# Patient Record
Sex: Female | Born: 1945 | Race: White | Hispanic: No | Marital: Married | Smoking: Never smoker
Health system: Southern US, Community
[De-identification: ages and names within clinical notes are randomized; demographics above are authoritative.]

## PROBLEM LIST (undated history)

## (undated) DIAGNOSIS — J3089 Other allergic rhinitis: Secondary | ICD-10-CM

## (undated) DIAGNOSIS — K219 Gastro-esophageal reflux disease without esophagitis: Secondary | ICD-10-CM

## (undated) DIAGNOSIS — R0600 Dyspnea, unspecified: Secondary | ICD-10-CM

## (undated) DIAGNOSIS — Z8709 Personal history of other diseases of the respiratory system: Secondary | ICD-10-CM

## (undated) DIAGNOSIS — R7303 Prediabetes: Secondary | ICD-10-CM

## (undated) DIAGNOSIS — R1310 Dysphagia, unspecified: Secondary | ICD-10-CM

## (undated) DIAGNOSIS — C4490 Unspecified malignant neoplasm of skin, unspecified: Secondary | ICD-10-CM

## (undated) DIAGNOSIS — M1990 Unspecified osteoarthritis, unspecified site: Secondary | ICD-10-CM

## (undated) DIAGNOSIS — I10 Essential (primary) hypertension: Secondary | ICD-10-CM

## (undated) DIAGNOSIS — E039 Hypothyroidism, unspecified: Secondary | ICD-10-CM

## (undated) DIAGNOSIS — F418 Other specified anxiety disorders: Secondary | ICD-10-CM

## (undated) DIAGNOSIS — Z8719 Personal history of other diseases of the digestive system: Secondary | ICD-10-CM

## (undated) DIAGNOSIS — L111 Transient acantholytic dermatosis [Grover]: Secondary | ICD-10-CM

## (undated) DIAGNOSIS — T7840XA Allergy, unspecified, initial encounter: Secondary | ICD-10-CM

## (undated) DIAGNOSIS — E041 Nontoxic single thyroid nodule: Secondary | ICD-10-CM

## (undated) DIAGNOSIS — R892 Abnormal level of other drugs, medicaments and biological substances in specimens from other organs, systems and tissues: Secondary | ICD-10-CM

## (undated) HISTORY — DX: Other specified anxiety disorders: F41.8

## (undated) HISTORY — DX: Essential (primary) hypertension: I10

## (undated) HISTORY — PX: OOPHORECTOMY: SHX86

## (undated) HISTORY — DX: Transient acantholytic dermatosis (grover): L11.1

## (undated) HISTORY — DX: Other allergic rhinitis: J30.89

## (undated) HISTORY — DX: Prediabetes: R73.03

## (undated) HISTORY — DX: Nontoxic single thyroid nodule: E04.1

## (undated) HISTORY — DX: Personal history of other diseases of the respiratory system: Z87.09

## (undated) HISTORY — DX: Unspecified malignant neoplasm of skin, unspecified: C44.90

## (undated) HISTORY — DX: Hypothyroidism, unspecified: E03.9

## (undated) HISTORY — PX: WISDOM TOOTH EXTRACTION: SHX21

## (undated) HISTORY — DX: Personal history of other diseases of the digestive system: Z87.19

## (undated) HISTORY — DX: Allergy, unspecified, initial encounter: T78.40XA

## (undated) HISTORY — DX: Unspecified osteoarthritis, unspecified site: M19.90

## (undated) HISTORY — DX: Abnormal level of other drugs, medicaments and biological substances in specimens from other organs, systems and tissues: R89.2

## (undated) HISTORY — PX: TUBAL LIGATION: SHX77

---

## 1993-11-30 HISTORY — PX: TOTAL ABDOMINAL HYSTERECTOMY: SHX209

## 2000-10-07 ENCOUNTER — Other Ambulatory Visit: Admission: RE | Admit: 2000-10-07 | Discharge: 2000-10-07 | Payer: Self-pay | Admitting: Gynecology

## 2003-12-13 ENCOUNTER — Other Ambulatory Visit: Admission: RE | Admit: 2003-12-13 | Discharge: 2003-12-13 | Payer: Self-pay | Admitting: Gynecology

## 2005-09-24 ENCOUNTER — Ambulatory Visit (HOSPITAL_COMMUNITY): Admission: RE | Admit: 2005-09-24 | Discharge: 2005-09-24 | Payer: Self-pay | Admitting: Chiropractic Medicine

## 2008-10-02 ENCOUNTER — Emergency Department (HOSPITAL_COMMUNITY): Admission: EM | Admit: 2008-10-02 | Discharge: 2008-10-03 | Payer: Self-pay | Admitting: Emergency Medicine

## 2008-10-30 HISTORY — PX: ESOPHAGOGASTRODUODENOSCOPY: SHX1529

## 2008-10-30 HISTORY — PX: COLONOSCOPY: SHX174

## 2008-11-07 LAB — HM COLONOSCOPY: HM COLON: NORMAL

## 2010-08-30 DIAGNOSIS — C4490 Unspecified malignant neoplasm of skin, unspecified: Secondary | ICD-10-CM

## 2010-08-30 HISTORY — DX: Unspecified malignant neoplasm of skin, unspecified: C44.90

## 2011-09-01 LAB — POCT CARDIAC MARKERS
CKMB, poc: 1.1
Myoglobin, poc: 45
Troponin i, poc: <0.05

## 2011-09-01 LAB — CBC
HCT: 38.1
Hemoglobin: 13
MCHC: 34.2
MCV: 92.7
Platelets: 247
RBC: 4.11
RDW: 12.4
WBC: 10.6 — ABNORMAL HIGH

## 2011-09-01 LAB — DIFFERENTIAL
Basophils Absolute: 0
Basophils Relative: 0
Eosinophils Absolute: 0.2
Eosinophils Relative: 2
Lymphocytes Relative: 13
Lymphs Abs: 1.4
Monocytes Absolute: 0.7
Monocytes Relative: 7
Neutro Abs: 8.3 — ABNORMAL HIGH
Neutrophils Relative %: 78 — ABNORMAL HIGH

## 2011-09-01 LAB — POCT I-STAT, CHEM 8
BUN: 13
Calcium, Ion: 1.14
Chloride: 105
Creatinine, Ser: 0.8
Glucose, Bld: 103 — ABNORMAL HIGH
HCT: 38
Hemoglobin: 12.9
Potassium: 3.9
Sodium: 141
TCO2: 26

## 2013-11-30 DIAGNOSIS — E039 Hypothyroidism, unspecified: Secondary | ICD-10-CM

## 2013-11-30 HISTORY — DX: Hypothyroidism, unspecified: E03.9

## 2014-06-11 DIAGNOSIS — H2510 Age-related nuclear cataract, unspecified eye: Secondary | ICD-10-CM | POA: Diagnosis not present

## 2014-06-11 DIAGNOSIS — H40039 Anatomical narrow angle, unspecified eye: Secondary | ICD-10-CM | POA: Diagnosis not present

## 2014-06-30 DIAGNOSIS — R7303 Prediabetes: Secondary | ICD-10-CM

## 2014-06-30 HISTORY — DX: Prediabetes: R73.03

## 2014-07-07 ENCOUNTER — Emergency Department: Payer: Self-pay | Admitting: Emergency Medicine

## 2014-07-07 DIAGNOSIS — S51809A Unspecified open wound of unspecified forearm, initial encounter: Secondary | ICD-10-CM | POA: Diagnosis not present

## 2014-07-07 DIAGNOSIS — S59919A Unspecified injury of unspecified forearm, initial encounter: Secondary | ICD-10-CM | POA: Diagnosis not present

## 2014-07-07 DIAGNOSIS — Z23 Encounter for immunization: Secondary | ICD-10-CM | POA: Diagnosis not present

## 2014-07-07 DIAGNOSIS — S59909A Unspecified injury of unspecified elbow, initial encounter: Secondary | ICD-10-CM | POA: Diagnosis not present

## 2014-07-17 DIAGNOSIS — IMO0002 Reserved for concepts with insufficient information to code with codable children: Secondary | ICD-10-CM | POA: Diagnosis not present

## 2014-07-17 DIAGNOSIS — E782 Mixed hyperlipidemia: Secondary | ICD-10-CM | POA: Diagnosis not present

## 2014-07-17 DIAGNOSIS — G894 Chronic pain syndrome: Secondary | ICD-10-CM | POA: Diagnosis not present

## 2014-07-17 DIAGNOSIS — I10 Essential (primary) hypertension: Secondary | ICD-10-CM | POA: Diagnosis not present

## 2014-07-17 DIAGNOSIS — F411 Generalized anxiety disorder: Secondary | ICD-10-CM | POA: Diagnosis not present

## 2014-07-18 ENCOUNTER — Emergency Department: Payer: Self-pay | Admitting: Emergency Medicine

## 2014-07-18 DIAGNOSIS — Z4802 Encounter for removal of sutures: Secondary | ICD-10-CM | POA: Diagnosis not present

## 2014-07-18 LAB — VITAMIN D 25 HYDROXY (VIT D DEFICIENCY, FRACTURES): Vit D, 25-Hydroxy: 46

## 2014-07-18 LAB — VITAMIN B12: VITAMIN B12 DEFICIENCY PANEL: 386

## 2014-07-18 LAB — CBC
HGB: 12.8 g/dL
PLATELET COUNT: 288
WBC: 5.3

## 2014-07-18 LAB — HEMOGLOBIN A1C: A1C: 6

## 2014-08-01 DIAGNOSIS — R634 Abnormal weight loss: Secondary | ICD-10-CM | POA: Diagnosis not present

## 2014-08-01 DIAGNOSIS — Z1211 Encounter for screening for malignant neoplasm of colon: Secondary | ICD-10-CM | POA: Diagnosis not present

## 2014-08-21 ENCOUNTER — Encounter (INDEPENDENT_AMBULATORY_CARE_PROVIDER_SITE_OTHER): Payer: Self-pay

## 2014-08-21 ENCOUNTER — Encounter: Payer: Self-pay | Admitting: Family Medicine

## 2014-08-21 ENCOUNTER — Ambulatory Visit (INDEPENDENT_AMBULATORY_CARE_PROVIDER_SITE_OTHER): Payer: Medicare Other | Admitting: Family Medicine

## 2014-08-21 VITALS — BP 147/71 | HR 63 | Temp 97.5°F | Ht 63.0 in | Wt 132.0 lb

## 2014-08-21 DIAGNOSIS — F341 Dysthymic disorder: Secondary | ICD-10-CM

## 2014-08-21 DIAGNOSIS — M1990 Unspecified osteoarthritis, unspecified site: Secondary | ICD-10-CM | POA: Insufficient documentation

## 2014-08-21 DIAGNOSIS — Z79890 Hormone replacement therapy (postmenopausal): Secondary | ICD-10-CM

## 2014-08-21 DIAGNOSIS — J309 Allergic rhinitis, unspecified: Secondary | ICD-10-CM

## 2014-08-21 DIAGNOSIS — J3089 Other allergic rhinitis: Secondary | ICD-10-CM

## 2014-08-21 DIAGNOSIS — F331 Major depressive disorder, recurrent, moderate: Secondary | ICD-10-CM | POA: Insufficient documentation

## 2014-08-21 DIAGNOSIS — E8940 Asymptomatic postprocedural ovarian failure: Secondary | ICD-10-CM | POA: Insufficient documentation

## 2014-08-21 DIAGNOSIS — I10 Essential (primary) hypertension: Secondary | ICD-10-CM | POA: Diagnosis not present

## 2014-08-21 DIAGNOSIS — M129 Arthropathy, unspecified: Secondary | ICD-10-CM

## 2014-08-21 DIAGNOSIS — R946 Abnormal results of thyroid function studies: Secondary | ICD-10-CM

## 2014-08-21 DIAGNOSIS — R7989 Other specified abnormal findings of blood chemistry: Secondary | ICD-10-CM | POA: Insufficient documentation

## 2014-08-21 DIAGNOSIS — F418 Other specified anxiety disorders: Secondary | ICD-10-CM

## 2014-08-21 MED ORDER — MONTELUKAST SODIUM 10 MG PO TABS: 10.0000 mg | ORAL_TABLET | Freq: Every day | ORAL | Status: DC

## 2014-08-21 NOTE — Assessment & Plan Note (Signed)
Discussed family stressors - continue klonopin nightly for now.

## 2014-08-21 NOTE — Patient Instructions (Signed)
Continue meds as up to now. Continue zyrtec in am, consider benadryl at night time for allergies. Trial of singulair for the next month for allergies. Return in the next few weeks for a welcome to medicare visit, prior fasting for blood work. Good to meet you today, call us with questions.

## 2014-08-21 NOTE — Assessment & Plan Note (Signed)
Discussed different options - recommend continue zyrtec in am and benadryl at night time. Did not tolerate INS in past. rec trial of singulair nightly for next month.

## 2014-08-21 NOTE — Progress Notes (Signed)
Pre visit review using our clinic review tool, if applicable. No additional management support is needed unless otherwise documented below in the visit note. 

## 2014-08-21 NOTE — Assessment & Plan Note (Signed)
Check TFT next fasting labwork.

## 2014-08-21 NOTE — Assessment & Plan Note (Signed)
Continue premarin. Consider decreased dose.

## 2014-08-21 NOTE — Assessment & Plan Note (Signed)
Chronic, mildly elevated despite coreg 6.25mg  once daily, pt hesitant for any changes today. Continue to monitor.

## 2014-08-21 NOTE — Progress Notes (Signed)
BP 147/71  Pulse 63  Temp(Src) 97.5 F (36.4 C) (Tympanic)  Ht 5\' 3"  (1.6 m)  Wt 132 lb (59.875 kg)  BMI 23.39 kg/m2  SpO2 99%   CC: new pt to establish care  Subjective:    Patient ID: Donna Vargas, female    DOB: 1946/08/07, 68 y.o.   MRN: [id number]  HPI: Donna Vargas is a 68 y.o. female presenting on 08/21/2014 for Establish Care   Prior saw Dr. Rex Kras. Received medicare last October at age 44. Weight loss noted, but more active since retired. Staying worried about son - he sees Dr. Keturah Barre.  HTN - bp tends to run 284 systolic. Compliant with coreg 6.25mg  once daily. If she takes 2, bp runs too low. No HA, vision changes, CP/tightness, SOB, leg swelling.  Longterm on klonazepam 0.5mg  nightly - uses this for sleep and "to calm me down".   Perennial allergic rhinitis - takes cetirizine 10mg  bid. Marked congestion > rhinorrhea. Itchy watery eyes as well. +PNdrainage. Constantly clears throat. No cough. No fevers. No headaches. On cetirizine longterm. Has tried several different medications all OTC - claritin, allegra, benadryl. Does not tolerate nasal steroids very well. Has not tried singulair. Has been tested for allergies, states allergic to everything.   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   Preventative: Last CPE within the last year. Colonoscopy 2009 [place] Tdap 2015 Pap smear 2014 G1P1  Relevant past medical, surgical, family and social history reviewed and updated as indicated.  Allergies and medications reviewed and updated. No current outpatient prescriptions on file prior to visit.   No current facility-administered medications on file prior to visit.    Review of Systems Per HPI unless specifically indicated above    Objective:    BP 147/71  Pulse 63  Temp(Src) 97.5 F (36.4 C) (Tympanic)  Ht 5\' 3"  (1.6 m)  Wt 132 lb (59.875 kg)  BMI 23.39 kg/m2  SpO2 99%  Physical Exam  Nursing note and vitals reviewed. Constitutional: She appears  well-developed and well-nourished. No distress.  HENT:  Nose: Nose normal. No mucosal edema or rhinorrhea. Right sinus exhibits no maxillary sinus tenderness and no frontal sinus tenderness. Left sinus exhibits no maxillary sinus tenderness and no frontal sinus tenderness.  Mouth/Throat: Oropharynx is clear and moist. No oropharyngeal exudate.  Eyes: Conjunctivae are normal. Pupils are equal, round, and reactive to light. No scleral icterus.  Neck: Normal range of motion. Neck supple. No thyromegaly present.  Cardiovascular: Normal rate, regular rhythm, normal heart sounds and intact distal pulses.   No murmur heard. Pulmonary/Chest: Effort normal and breath sounds normal. No respiratory distress. She has no wheezes. She has no rales.  Musculoskeletal: She exhibits no edema.  Lymphadenopathy:    She has no cervical adenopathy.       Assessment & Plan:   Problem List Items Addressed This Visit   Surgical menopause on hormone replacement therapy     Continue premarin. Consider decreased dose.    Perennial allergic rhinitis - Primary     Discussed different options - recommend continue zyrtec in am and benadryl at night time. Did not tolerate INS in past. rec trial of singulair nightly for next month.    Hypertension     Chronic, mildly elevated despite coreg 6.25mg  once daily, pt hesitant for any changes today. Continue to monitor.    Relevant Medications      carvedilol (COREG) 6.25 MG tablet      aspirin 81 MG tablet  Depression with anxiety     Discussed family stressors - continue klonopin nightly for now.    Arthritis   Relevant Medications      aspirin 81 MG tablet   Abnormal thyroid blood test     Check TFT next fasting labwork.        Follow up plan: Return in about 3 weeks (around 09/11/2014), or as needed, for welcome to medicare visit.

## 2014-08-29 ENCOUNTER — Other Ambulatory Visit: Payer: Self-pay | Admitting: *Deleted

## 2014-08-29 MED ORDER — ESTROGENS CONJUGATED 0.45 MG PO TABS: 0.4500 mg | ORAL_TABLET | Freq: Every day | ORAL | Status: DC

## 2014-08-30 DIAGNOSIS — H02834 Dermatochalasis of left upper eyelid: Secondary | ICD-10-CM | POA: Diagnosis not present

## 2014-08-30 DIAGNOSIS — H25813 Combined forms of age-related cataract, bilateral: Secondary | ICD-10-CM | POA: Diagnosis not present

## 2014-08-30 DIAGNOSIS — H02831 Dermatochalasis of right upper eyelid: Secondary | ICD-10-CM | POA: Diagnosis not present

## 2014-08-31 ENCOUNTER — Ambulatory Visit (INDEPENDENT_AMBULATORY_CARE_PROVIDER_SITE_OTHER): Payer: Medicare Other | Admitting: Family Medicine

## 2014-08-31 ENCOUNTER — Encounter: Payer: Self-pay | Admitting: Family Medicine

## 2014-08-31 VITALS — BP 138/82 | HR 68 | Temp 97.9°F | Wt 131.5 lb

## 2014-08-31 DIAGNOSIS — M25561 Pain in right knee: Secondary | ICD-10-CM | POA: Diagnosis not present

## 2014-08-31 MED ORDER — NAPROXEN 500 MG PO TABS: ORAL_TABLET | ORAL | Status: DC

## 2014-08-31 NOTE — Patient Instructions (Signed)
I think you have right knee ligament strain - treat with naprosyn (prescription strength aleve) twice daily for 5 days with food then as needed May continue heating pad.  Start using a neoprene sleeve brace for the right knee. Start exercises provided today. If not improving with above, let me know.

## 2014-08-31 NOTE — Assessment & Plan Note (Signed)
Anticipate lateral collateral ligament sprain given exam today. Treat with stretching exercises provided today, heating pad, naprosyn prescription sent to pharmacy, and recommended start using neoprene knee sleeve brace. Update if not improving, ddx includes meniscal injury and early arthritis.

## 2014-08-31 NOTE — Progress Notes (Signed)
Pre visit review using our clinic review tool, if applicable. No additional management support is needed unless otherwise documented below in the visit note. 

## 2014-08-31 NOTE — Progress Notes (Signed)
   BP 138/82  Pulse 68  Temp(Src) 97.9 F (36.6 C) (Oral)  Wt 131 lb 8 oz (59.648 kg)  SpO2 97%   CC: R knee pain  Subjective:    Patient ID: Donna Vargas, female    DOB: 04/10/1946, 68 y.o.   MRN: [id number]  HPI: Donna Vargas is a 68 y.o. female presenting on 08/31/2014 for Knee Pain   Off and on knee pain over months. Acutely worse over last week. Denies warmth or redness of knee. Possibly more swollen as day progresses. Bearing weight and kneeling worsens pain. No locking.  + instability when walking. Denies inciting trauma/injury. Aleve helps.   H/o arthritis in digits but never large joint involvement.  Relevant past medical, surgical, family and social history reviewed and updated as indicated.  Allergies and medications reviewed and updated. Current Outpatient Prescriptions on File Prior to Visit  Medication Sig  . aspirin 81 MG tablet Take 81 mg by mouth daily.  . carvedilol (COREG) 6.25 MG tablet Take 6.25 mg by mouth daily.   . cetirizine (ZYRTEC) 10 MG tablet Take 10 mg by mouth daily.  . clonazePAM (KLONOPIN) 0.5 MG tablet Take 0.5 mg by mouth at bedtime.   Marland Kitchen estrogens, conjugated, (PREMARIN) 0.45 MG tablet Take 1 tablet (0.45 mg total) by mouth daily.  . montelukast (SINGULAIR) 10 MG tablet Take 1 tablet (10 mg total) by mouth at bedtime.   No current facility-administered medications on file prior to visit.    Review of Systems Per HPI unless specifically indicated above    Objective:    BP 138/82  Pulse 68  Temp(Src) 97.9 F (36.6 C) (Oral)  Wt 131 lb 8 oz (59.648 kg)  SpO2 97%  Physical Exam  Nursing note and vitals reviewed. Constitutional: She appears well-developed and well-nourished. No distress.  Musculoskeletal: She exhibits no edema.  L knee WNL R Knee exam: No deformity on inspection. Pain with palpation of lateral collateral ligament No effusion/swelling noted. FROM in flex/extension without crepitus. No popliteal  fullness. Neg drawer test. Mild discomfort with mcmurray's No pain with valgus/varus stress. No PFgrind. Tight patellar mobility on right  Skin: Skin is warm and dry. No rash noted.       Assessment & Plan:   Problem List Items Addressed This Visit   Pain in lateral portion of right knee - Primary     Anticipate lateral collateral ligament sprain given exam today. Treat with stretching exercises provided today, heating pad, naprosyn prescription sent to pharmacy, and recommended start using neoprene knee sleeve brace. Update if not improving, ddx includes meniscal injury and early arthritis.        Follow up plan: Return if symptoms worsen or fail to improve.

## 2014-09-06 ENCOUNTER — Other Ambulatory Visit (INDEPENDENT_AMBULATORY_CARE_PROVIDER_SITE_OTHER): Payer: Medicare Other

## 2014-09-06 DIAGNOSIS — R946 Abnormal results of thyroid function studies: Secondary | ICD-10-CM | POA: Diagnosis not present

## 2014-09-06 DIAGNOSIS — I10 Essential (primary) hypertension: Secondary | ICD-10-CM | POA: Diagnosis not present

## 2014-09-06 DIAGNOSIS — R7989 Other specified abnormal findings of blood chemistry: Secondary | ICD-10-CM

## 2014-09-06 LAB — COMPREHENSIVE METABOLIC PANEL
ALT: 10 U/L (ref 0–35)
AST: 19 U/L (ref 0–37)
Albumin: 3.5 g/dL (ref 3.5–5.2)
Alkaline Phosphatase: 51 U/L (ref 39–117)
BUN: 16 mg/dL (ref 6–23)
CO2: 30 mEq/L (ref 19–32)
Calcium: 9 mg/dL (ref 8.4–10.5)
Chloride: 105 mEq/L (ref 96–112)
Creatinine, Ser: 0.7 mg/dL (ref 0.4–1.2)
GFR: 92.02 mL/min (ref >60.00)
Glucose, Bld: 87 mg/dL (ref 70–99)
Potassium: 4 mEq/L (ref 3.5–5.1)
Sodium: 141 mEq/L (ref 135–145)
Total Bilirubin: 0.4 mg/dL (ref 0.2–1.2)
Total Protein: 7.5 g/dL (ref 6.0–8.3)

## 2014-09-06 LAB — LIPID PANEL
Cholesterol: 227 mg/dL — ABNORMAL HIGH (ref 0–200)
HDL: 58.9 mg/dL (ref >39.00)
LDL Cholesterol: 151 mg/dL — ABNORMAL HIGH (ref 0–99)
NonHDL: 168.1
Total CHOL/HDL Ratio: 4
Triglycerides: 86 mg/dL (ref 0.0–149.0)
VLDL: 17.2 mg/dL (ref 0.0–40.0)

## 2014-09-06 LAB — T4, FREE: Free T4: 0.84 ng/dL (ref 0.60–1.60)

## 2014-09-06 LAB — TSH: TSH: 3.3 u[IU]/mL (ref 0.35–4.50)

## 2014-09-07 LAB — T3: T3, Total: 124.5 ng/dL (ref 80.0–204.0)

## 2014-09-11 ENCOUNTER — Encounter: Payer: Self-pay | Admitting: Family Medicine

## 2014-09-11 ENCOUNTER — Ambulatory Visit (INDEPENDENT_AMBULATORY_CARE_PROVIDER_SITE_OTHER): Payer: Medicare Other | Admitting: Family Medicine

## 2014-09-11 VITALS — BP 144/84 | HR 64 | Temp 97.7°F | Ht 63.0 in | Wt 129.2 lb

## 2014-09-11 DIAGNOSIS — R946 Abnormal results of thyroid function studies: Secondary | ICD-10-CM

## 2014-09-11 DIAGNOSIS — R7989 Other specified abnormal findings of blood chemistry: Secondary | ICD-10-CM

## 2014-09-11 DIAGNOSIS — Z7189 Other specified counseling: Secondary | ICD-10-CM | POA: Insufficient documentation

## 2014-09-11 DIAGNOSIS — Z0000 Encounter for general adult medical examination without abnormal findings: Secondary | ICD-10-CM

## 2014-09-11 DIAGNOSIS — Z79890 Hormone replacement therapy (postmenopausal): Secondary | ICD-10-CM

## 2014-09-11 DIAGNOSIS — I10 Essential (primary) hypertension: Secondary | ICD-10-CM

## 2014-09-11 DIAGNOSIS — E8940 Asymptomatic postprocedural ovarian failure: Secondary | ICD-10-CM

## 2014-09-11 DIAGNOSIS — E041 Nontoxic single thyroid nodule: Secondary | ICD-10-CM

## 2014-09-11 HISTORY — DX: Nontoxic single thyroid nodule: E04.1

## 2014-09-11 MED ORDER — ESTROGENS CONJUGATED 0.3 MG PO TABS: 0.3000 mg | ORAL_TABLET | Freq: Every day | ORAL | Status: DC

## 2014-09-11 NOTE — Assessment & Plan Note (Signed)
Advanced directive: has at home. Will bring me copy. HCPOA is husband.

## 2014-09-11 NOTE — Assessment & Plan Note (Signed)
Thyroid US ordered today. 

## 2014-09-11 NOTE — Progress Notes (Signed)
Pre visit review using our clinic review tool, if applicable. No additional management support is needed unless otherwise documented below in the visit note. 

## 2014-09-11 NOTE — Patient Instructions (Addendum)
Sign another release of records from Dr. Rex Kras Bring me copy of advanced directive.  Good to see you today, call us with questions. Return in 6 months for recheck. Start lower premarin dose sent to pharmacy. Pass by Linda's office to schedule thyroid ultrasound.  Health Maintenance Adopting a healthy lifestyle and getting preventive care can go a long way to promote health and wellness. Talk with your health care provider about what schedule of regular examinations is right for you. This is a good chance for you to check in with your provider about disease prevention and staying healthy. In between checkups, there are plenty of things you can do on your own. Experts have done a lot of research about which lifestyle changes and preventive measures are most likely to keep you healthy. Ask your health care provider for more information. WEIGHT AND DIET  Eat a healthy diet  Be sure to include plenty of vegetables, fruits, low-fat dairy products, and lean protein.  Do not eat a lot of foods high in solid fats, added sugars, or salt.  Get regular exercise. This is one of the most important things you can do for your health.  Most adults should exercise for at least 150 minutes each week. The exercise should increase your heart rate and make you sweat (moderate-intensity exercise).  Most adults should also do strengthening exercises at least twice a week. This is in addition to the moderate-intensity exercise.  Maintain a healthy weight  Body mass index (BMI) is a measurement that can be used to identify possible weight problems. It estimates body fat based on height and weight. Your health care provider can help determine your BMI and help you achieve or maintain a healthy weight.  For females 61 years of age and older:   A BMI below 18.5 is considered underweight.  A BMI of 18.5 to 24.9 is normal.  A BMI of 25 to 29.9 is considered overweight.  A BMI of 30 and above is considered  obese.  Watch levels of cholesterol and blood lipids  You should start having your blood tested for lipids and cholesterol at 66 years of age, then have this test every 5 years.  You may need to have your cholesterol levels checked more often if:  Your lipid or cholesterol levels are high.  You are older than 66 years of age.  You are at high risk for heart disease.  CANCER SCREENING   Lung Cancer  Lung cancer screening is recommended for adults 4-41 years old who are at high risk for lung cancer because of a history of smoking.  A yearly low-dose CT scan of the lungs is recommended for people who:  Currently smoke.  Have quit within the past 15 years.  Have at least a 30-pack-year history of smoking. A pack year is smoking an average of one pack of cigarettes a day for 1 year.  Yearly screening should continue until it has been 15 years since you quit.  Yearly screening should stop if you develop a health problem that would prevent you from having lung cancer treatment.  Breast Cancer  Practice breast self-awareness. This means understanding how your breasts normally appear and feel.  It also means doing regular breast self-exams. Let your health care provider know about any changes, no matter how small.  If you are in your 20s or 30s, you should have a clinical breast exam (CBE) by a health care provider every 1-3 years as part of a regular  health exam.  If you are 40 or older, have a CBE every year. Also consider having a breast X-ray (mammogram) every year.  If you have a family history of breast cancer, talk to your health care provider about genetic screening.  If you are at high risk for breast cancer, talk to your health care provider about having an MRI and a mammogram every year.  Breast cancer gene (BRCA) assessment is recommended for women who have family members with BRCA-related cancers. BRCA-related cancers  include:  Breast.  Ovarian.  Tubal.  Peritoneal cancers.  Results of the assessment will determine the need for genetic counseling and BRCA1 and BRCA2 testing. Cervical Cancer Routine pelvic examinations to screen for cervical cancer are no longer recommended for nonpregnant women who are considered low risk for cancer of the pelvic organs (ovaries, uterus, and vagina) and who do not have symptoms. A pelvic examination may be necessary if you have symptoms including those associated with pelvic infections. Ask your health care provider if a screening pelvic exam is right for you.   The Pap test is the screening test for cervical cancer for women who are considered at risk.  If you had a hysterectomy for a problem that was not cancer or a condition that could lead to cancer, then you no longer need Pap tests.  If you are older than 65 years, and you have had normal Pap tests for the past 10 years, you no longer need to have Pap tests.  If you have had past treatment for cervical cancer or a condition that could lead to cancer, you need Pap tests and screening for cancer for at least 20 years after your treatment.  If you no longer get a Pap test, assess your risk factors if they change (such as having a new sexual partner). This can affect whether you should start being screened again.  Some women have medical problems that increase their chance of getting cervical cancer. If this is the case for you, your health care provider may recommend more frequent screening and Pap tests.  The human papillomavirus (HPV) test is another test that may be used for cervical cancer screening. The HPV test looks for the virus that can cause cell changes in the cervix. The cells collected during the Pap test can be tested for HPV.  The HPV test can be used to screen women 52 years of age and older. Getting tested for HPV can extend the interval between normal Pap tests from three to five years.  An HPV  test also should be used to screen women of any age who have unclear Pap test results.  After 66 years of age, women should have HPV testing as often as Pap tests.  Colorectal Cancer  This type of cancer can be detected and often prevented.  Routine colorectal cancer screening usually begins at 66 years of age and continues through 66 years of age.  Your health care provider may recommend screening at an earlier age if you have risk factors for colon cancer.  Your health care provider may also recommend using home test kits to check for hidden blood in the stool.  A small camera at the end of a tube can be used to examine your colon directly (sigmoidoscopy or colonoscopy). This is done to check for the earliest forms of colorectal cancer.  Routine screening usually begins at age 60.  Direct examination of the colon should be repeated every 5-10 years through 65 years  of age. However, you may need to be screened more often if early forms of precancerous polyps or small growths are found. Skin Cancer  Check your skin from head to toe regularly.  Tell your health care provider about any new moles or changes in moles, especially if there is a change in a mole's shape or color.  Also tell your health care provider if you have a mole that is larger than the size of a pencil eraser.  Always use sunscreen. Apply sunscreen liberally and repeatedly throughout the day.  Protect yourself by wearing long sleeves, pants, a wide-brimmed hat, and sunglasses whenever you are outside. HEART DISEASE, DIABETES, AND HIGH BLOOD PRESSURE   Have your blood pressure checked at least every 1-2 years. High blood pressure causes heart disease and increases the risk of stroke.  If you are between 2 years and 36 years old, ask your health care provider if you should take aspirin to prevent strokes.  Have regular diabetes screenings. This involves taking a blood sample to check your fasting blood sugar  level.  If you are at a normal weight and have a low risk for diabetes, have this test once every three years after 66 years of age.  If you are overweight and have a high risk for diabetes, consider being tested at a younger age or more often. PREVENTING INFECTION  Hepatitis B  If you have a higher risk for hepatitis B, you should be screened for this virus. You are considered at high risk for hepatitis B if:  You were born in a country where hepatitis B is common. Ask your health care provider which countries are considered high risk.  Your parents were born in a high-risk country, and you have not been immunized against hepatitis B (hepatitis B vaccine).  You have HIV or AIDS.  You use needles to inject street drugs.  You live with someone who has hepatitis B.  You have had sex with someone who has hepatitis B.  You get hemodialysis treatment.  You take certain medicines for conditions, including cancer, organ transplantation, and autoimmune conditions. Hepatitis C  Blood testing is recommended for:  Everyone born from [birth year] through 1963.  Anyone with known risk factors for hepatitis C. Sexually transmitted infections (STIs)  You should be screened for sexually transmitted infections (STIs) including gonorrhea and chlamydia if:  You are sexually active and are younger than 66 years of age.  You are older than 66 years of age and your health care provider tells you that you are at risk for this type of infection.  Your sexual activity has changed since you were last screened and you are at an increased risk for chlamydia or gonorrhea. Ask your health care provider if you are at risk.  If you do not have HIV, but are at risk, it may be recommended that you take a prescription medicine daily to prevent HIV infection. This is called pre-exposure prophylaxis (PrEP). You are considered at risk if:  You are sexually active and do not regularly use condoms or know the HIV status  of your partner(s).  You take drugs by injection.  You are sexually active with a partner who has HIV. Talk with your health care provider about whether you are at high risk of being infected with HIV. If you choose to begin PrEP, you should first be tested for HIV. You should then be tested every 3 months for as long as you are taking PrEP.  PREGNANCY  If you are premenopausal and you may become pregnant, ask your health care provider about preconception counseling.  If you may become pregnant, take 400 to 800 micrograms (mcg) of folic acid every day.  If you want to prevent pregnancy, talk to your health care provider about birth control (contraception). OSTEOPOROSIS AND MENOPAUSE   Osteoporosis is a disease in which the bones lose minerals and strength with aging. This can result in serious bone fractures. Your risk for osteoporosis can be identified using a bone density scan.  If you are 55 years of age or older, or if you are at risk for osteoporosis and fractures, ask your health care provider if you should be screened.  Ask your health care provider whether you should take a calcium or vitamin D supplement to lower your risk for osteoporosis.  Menopause may have certain physical symptoms and risks.  Hormone replacement therapy may reduce some of these symptoms and risks. Talk to your health care provider about whether hormone replacement therapy is right for you.  HOME CARE INSTRUCTIONS   Schedule regular health, dental, and eye exams.  Stay current with your immunizations.   Do not use any tobacco products including cigarettes, chewing tobacco, or electronic cigarettes.  If you are pregnant, do not drink alcohol.  If you are breastfeeding, limit how much and how often you drink alcohol.  Limit alcohol intake to no more than 1 drink per day for nonpregnant women. One drink equals 12 ounces of beer, 5 ounces of wine, or 1 ounces of hard liquor.  Do not use street  drugs.  Do not share needles.  Ask your health care provider for help if you need support or information about quitting drugs.  Tell your health care provider if you often feel depressed.  Tell your health care provider if you have ever been abused or do not feel safe at home. Document Released: 06/01/2011 Document Revised: 04/02/2014 Document Reviewed: 10/18/2013 [facility] Patient Information 2015 [place], [place]. This information is not intended to replace advice given to you by your health care provider. Make sure you discuss any questions you have with your health care provider.

## 2014-09-11 NOTE — Assessment & Plan Note (Signed)
On recheck, normal.

## 2014-09-11 NOTE — Assessment & Plan Note (Signed)
I have personally reviewed the Medicare Annual Wellness questionnaire and have noted 1. The patient's medical and social history 2. Their use of alcohol, tobacco or illicit drugs 3. Their current medications and supplements 4. The patient's functional ability including ADL's, fall risks, home safety risks and hearing or visual impairment. 5. Diet and physical activity 6. Evidence for depression or mood disorders The patients weight, height, BMI have been recorded in the chart.  Hearing and vision has been addressed. I have made referrals, counseling and provided education to the patient based review of the above and I have provided the pt with a written personalized care plan for preventive services. Provider list updated - see scanned questionairre.  Reviewed preventative protocols and updated unless pt declined. EKG - NSR rate 60s, normal axis, intervals, no acute ST/T changes, ?neg QRS anterior leads

## 2014-09-11 NOTE — Assessment & Plan Note (Signed)
Chronic, stable. Continue regimen. 

## 2014-09-11 NOTE — Progress Notes (Signed)
BP 144/84  Pulse 64  Temp(Src) 97.7 F (36.5 C) (Oral)  Ht 5\' 3"  (1.6 m)  Wt 129 lb 4 oz (58.627 kg)  BMI 22.90 kg/m2   CC: welcome to medicare  Subjective:    Patient ID: Donna Vargas, female    DOB: September 24, 1946, 68 y.o.   MRN: [id number]  HPI: Donna Vargas is a 68 y.o. female presenting on 09/11/2014 for Annual Exam   Wt Readings from Last 3 Encounters:  09/11/14 129 lb 4 oz (58.627 kg)  08/31/14 131 lb 8 oz (59.648 kg)  08/21/14 132 lb (59.875 kg)   Body mass index is 22.9 kg/(m^2).   Hearing screen passed. Recent eye exam with eye doctor 1 fall after horse kicked her, needed stitches 06/2014 Denies depression/anhedonia, sadness  Preventative:  Colonoscopy 2009 [facility]  Pap smear 2014 - wants PCP to do this. Consider rpt 2017 - pelvic.  Breast cancer screening - normal 1 year ago. Unsure but doesn't think needs one yet DEXA - had one and normal per patient. Flu shot declines Pneumonia - declines Tdap 06/2014 zostavax - declines. Has had shingles.  Advanced directive: has at home. Will bring me copy. HCPOA is husband. 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  Allergies and medications reviewed and updated. Current Outpatient Prescriptions on File Prior to Visit  Medication Sig  . aspirin 81 MG tablet Take 81 mg by mouth daily.  . carvedilol (COREG) 6.25 MG tablet Take 6.25 mg by mouth daily.   . cetirizine (ZYRTEC) 10 MG tablet Take 10 mg by mouth daily.  . clonazePAM (KLONOPIN) 0.5 MG tablet Take 0.5 mg by mouth at bedtime.   . montelukast (SINGULAIR) 10 MG tablet Take 1 tablet (10 mg total) by mouth at bedtime.  . naproxen (NAPROSYN) 500 MG tablet Take one po  bid x 5 days then prn pain, take with food   No current facility-administered medications on file prior to visit.    Review of Systems Per HPI unless specifically indicated above    Objective:    BP 144/84  Pulse 64  Temp(Src) 97.7 F (36.5 C) (Oral)  Ht 5\' 3"  (1.6 m)  Wt 129 lb 4 oz (58.627 kg)  BMI 22.90 kg/m2  Physical Exam  Nursing note and vitals reviewed. Constitutional: She is oriented to person, place, and time. She appears well-developed and well-nourished. No distress.  HENT:  Head: Normocephalic and atraumatic.  Right Ear: Hearing, tympanic membrane, external ear and ear canal normal.  Left Ear: Hearing, tympanic membrane, external ear and ear canal normal.  Nose: Nose normal.  Mouth/Throat: Uvula is midline, oropharynx is clear and moist and mucous membranes are normal. No oropharyngeal exudate, posterior oropharyngeal edema or posterior oropharyngeal erythema.  Eyes: Conjunctivae and EOM are normal. Pupils are equal, round, and reactive to light. No scleral icterus.  Neck: Normal range of motion. Neck supple. Carotid bruit is not present. Mass (R nodule?) present. No thyromegaly present.  Cardiovascular: Normal rate, regular rhythm, normal heart sounds and intact distal pulses.   No murmur heard. Pulses:      Radial pulses are 2+ on the right side, and 2+ on the left side.  Pulmonary/Chest: Effort normal and breath sounds normal. No respiratory distress. She has no wheezes.  She has no rales. Right breast exhibits no inverted nipple, no mass, no nipple discharge, no skin change and no tenderness. Left breast exhibits no inverted nipple, no mass, no nipple discharge, no skin change and no tenderness. Breasts are symmetrical.  Abdominal: Soft. Bowel sounds are normal. She exhibits no distension and no mass. There is no tenderness. There is no rebound and no guarding.  Musculoskeletal: Normal range of motion. She exhibits no edema.  Lymphadenopathy:       Head (right  side): No submental, no submandibular, no tonsillar, no preauricular and no posterior auricular adenopathy present.       Head (left side): No submental, no submandibular, no tonsillar, no preauricular and no posterior auricular adenopathy present.    She has no cervical adenopathy.    She has no axillary adenopathy.       Right axillary: No lateral adenopathy present.       Left axillary: No lateral adenopathy present.      Right: No supraclavicular adenopathy present.       Left: No supraclavicular adenopathy present.  Neurological: She is alert and oriented to person, place, and time.  CN grossly intact, station and gait intact Recall 3/3 Calculation 5/5 serial 3s  Skin: Skin is warm and dry. No rash noted.  Psychiatric: She has a normal mood and affect. Her behavior is normal. Judgment and thought content normal.   Results for orders placed in visit on 09/06/14  T3      Result Value Ref Range   T3, Total 124.5  80.0 - 204.0 ng/dL  T4, FREE      Result Value Ref Range   Free T4 0.84  0.60 - 1.60 ng/dL  TSH      Result Value Ref Range   TSH 3.30  0.35 - 4.50 uIU/mL  COMPREHENSIVE METABOLIC PANEL      Result Value Ref Range   Sodium 141  135 - 145 mEq/L   Potassium 4.0  3.5 - 5.1 mEq/L   Chloride 105  96 - 112 mEq/L   CO2 30  19 - 32 mEq/L   Glucose, Bld 87  70 - 99 mg/dL   Vargas 16  6 - 23 mg/dL   Creatinine, Ser 0.7  0.4 - 1.2 mg/dL   Total Bilirubin 0.4  0.2 - 1.2 mg/dL   Alkaline Phosphatase 51  39 - 117 U/L   AST 19  0 - 37 U/L   ALT 10  0 - 35 U/L   Total Protein 7.5  6.0 - 8.3 g/dL   Albumin 3.5  3.5 - 5.2 g/dL   Calcium 9.0  8.4 - 10.5 mg/dL   GFR 92.02  >60.00 mL/min  LIPID PANEL      Result Value Ref Range   Cholesterol 227 (*) 0 - 200 mg/dL   Triglycerides 86.0  0.0 - 149.0 mg/dL   HDL 58.90  >39.00 mg/dL   VLDL 17.2  0.0 - 40.0 mg/dL   LDL Cholesterol 151 (*) 0 - 99 mg/dL   Total CHOL/HDL Ratio 4     NonHDL 168.10        Assessment & Plan:   Problem  List Items Addressed This Visit   Welcome to Medicare preventive visit - Primary     I have personally reviewed the Medicare Annual Wellness questionnaire and have noted 1. The patient's medical and social history 2. Their use of alcohol, tobacco or illicit drugs 3. Their current medications and supplements 4. The  patient's functional ability including ADL's, fall risks, home safety risks and hearing or visual impairment. 5. Diet and physical activity 6. Evidence for depression or mood disorders The patients weight, height, BMI have been recorded in the chart.  Hearing and vision has been addressed. I have made referrals, counseling and provided education to the patient based review of the above and I have provided the pt with a written personalized care plan for preventive services. Provider list updated - see scanned questionairre.  Reviewed preventative protocols and updated unless pt declined. EKG - NSR rate 60s, normal axis, intervals, no acute ST/T changes, ?neg QRS anterior leads    Relevant Orders      EKG 12-Lead (Completed)   Surgical menopause on hormone replacement therapy     Discussed pros/cons of HRT. Pt will start slow taper - sent in 0.3mg  dose.    Right thyroid nodule     Thyroid US ordered today.    Relevant Orders      US Soft Tissue Head/Neck   Hypertension     Chronic, stable. Continue regimen.    Advanced care planning/counseling discussion     Advanced directive: has at home. Will bring me copy. HCPOA is husband.    RESOLVED: Abnormal thyroid blood test     On recheck, normal.        Follow up plan: Return in about 6 months (around 03/13/2015), or as needed, for follow up visit.

## 2014-09-11 NOTE — Assessment & Plan Note (Signed)
Discussed pros/cons of HRT. Pt will start slow taper - sent in 0.3mg  dose.

## 2014-09-12 ENCOUNTER — Telehealth: Payer: Self-pay | Admitting: Family Medicine

## 2014-09-12 NOTE — Telephone Encounter (Signed)
[facility] MAILED  °

## 2014-09-13 ENCOUNTER — Ambulatory Visit: Payer: Self-pay | Admitting: Family Medicine

## 2014-09-13 ENCOUNTER — Encounter: Payer: Self-pay | Admitting: Family Medicine

## 2014-09-13 DIAGNOSIS — E042 Nontoxic multinodular goiter: Secondary | ICD-10-CM | POA: Diagnosis not present

## 2014-09-13 DIAGNOSIS — E041 Nontoxic single thyroid nodule: Secondary | ICD-10-CM | POA: Diagnosis not present

## 2014-09-14 ENCOUNTER — Other Ambulatory Visit: Payer: Self-pay | Admitting: Family Medicine

## 2014-09-14 DIAGNOSIS — E041 Nontoxic single thyroid nodule: Secondary | ICD-10-CM

## 2014-09-19 ENCOUNTER — Ambulatory Visit: Payer: Self-pay | Admitting: Gastroenterology

## 2014-09-24 ENCOUNTER — Encounter: Payer: Self-pay | Admitting: Family Medicine

## 2014-09-24 ENCOUNTER — Ambulatory Visit: Payer: Self-pay | Admitting: Family Medicine

## 2014-09-24 DIAGNOSIS — E042 Nontoxic multinodular goiter: Secondary | ICD-10-CM | POA: Diagnosis not present

## 2014-09-24 DIAGNOSIS — E041 Nontoxic single thyroid nodule: Secondary | ICD-10-CM | POA: Diagnosis not present

## 2014-09-24 DIAGNOSIS — R946 Abnormal results of thyroid function studies: Secondary | ICD-10-CM | POA: Diagnosis not present

## 2014-09-26 ENCOUNTER — Encounter: Payer: Self-pay | Admitting: Family Medicine

## 2014-09-27 ENCOUNTER — Encounter: Payer: Self-pay | Admitting: Family Medicine

## 2014-09-27 ENCOUNTER — Telehealth: Payer: Self-pay | Admitting: Family Medicine

## 2014-09-27 DIAGNOSIS — J3089 Other allergic rhinitis: Secondary | ICD-10-CM

## 2014-09-27 NOTE — Telephone Encounter (Signed)
Spoke with patient to discuss results of benign thyroid nodule biopsy. Pt asks about referral to allergist, previously on shots. singulair not helpful Referral placed.

## 2014-10-14 ENCOUNTER — Encounter: Payer: Self-pay | Admitting: Family Medicine

## 2014-10-18 ENCOUNTER — Encounter: Payer: Self-pay | Admitting: *Deleted

## 2014-10-23 ENCOUNTER — Other Ambulatory Visit: Payer: Self-pay | Admitting: Family Medicine

## 2014-10-23 DIAGNOSIS — J309 Allergic rhinitis, unspecified: Secondary | ICD-10-CM | POA: Diagnosis not present

## 2014-10-23 DIAGNOSIS — J3089 Other allergic rhinitis: Secondary | ICD-10-CM | POA: Diagnosis not present

## 2014-10-23 DIAGNOSIS — R21 Rash and other nonspecific skin eruption: Secondary | ICD-10-CM | POA: Diagnosis not present

## 2014-10-24 DIAGNOSIS — J3089 Other allergic rhinitis: Secondary | ICD-10-CM | POA: Diagnosis not present

## 2014-11-27 DIAGNOSIS — J3089 Other allergic rhinitis: Secondary | ICD-10-CM | POA: Diagnosis not present

## 2014-12-02 ENCOUNTER — Encounter: Payer: Self-pay | Admitting: Family Medicine

## 2014-12-04 DIAGNOSIS — J3089 Other allergic rhinitis: Secondary | ICD-10-CM | POA: Diagnosis not present

## 2014-12-06 DIAGNOSIS — J3089 Other allergic rhinitis: Secondary | ICD-10-CM | POA: Diagnosis not present

## 2014-12-11 DIAGNOSIS — J3089 Other allergic rhinitis: Secondary | ICD-10-CM | POA: Diagnosis not present

## 2014-12-17 ENCOUNTER — Other Ambulatory Visit: Payer: Self-pay | Admitting: *Deleted

## 2014-12-17 MED ORDER — CLONAZEPAM 0.5 MG PO TABS: 0.5000 mg | ORAL_TABLET | Freq: Every day | ORAL | Status: DC

## 2014-12-17 NOTE — Telephone Encounter (Signed)
Ok to refill 

## 2014-12-17 NOTE — Telephone Encounter (Signed)
plz phone in. 

## 2014-12-18 DIAGNOSIS — J3089 Other allergic rhinitis: Secondary | ICD-10-CM | POA: Diagnosis not present

## 2014-12-18 NOTE — Telephone Encounter (Signed)
Rx called in as directed.   

## 2014-12-20 DIAGNOSIS — J3089 Other allergic rhinitis: Secondary | ICD-10-CM | POA: Diagnosis not present

## 2014-12-25 DIAGNOSIS — J3089 Other allergic rhinitis: Secondary | ICD-10-CM | POA: Diagnosis not present

## 2014-12-27 DIAGNOSIS — J3089 Other allergic rhinitis: Secondary | ICD-10-CM | POA: Diagnosis not present

## 2015-01-03 DIAGNOSIS — J3089 Other allergic rhinitis: Secondary | ICD-10-CM | POA: Diagnosis not present

## 2015-01-08 DIAGNOSIS — J3089 Other allergic rhinitis: Secondary | ICD-10-CM | POA: Diagnosis not present

## 2015-01-15 DIAGNOSIS — J3089 Other allergic rhinitis: Secondary | ICD-10-CM | POA: Diagnosis not present

## 2015-01-17 DIAGNOSIS — J3089 Other allergic rhinitis: Secondary | ICD-10-CM | POA: Diagnosis not present

## 2015-01-22 DIAGNOSIS — J3089 Other allergic rhinitis: Secondary | ICD-10-CM | POA: Diagnosis not present

## 2015-01-29 DIAGNOSIS — J3089 Other allergic rhinitis: Secondary | ICD-10-CM | POA: Diagnosis not present

## 2015-01-30 ENCOUNTER — Telehealth: Payer: Self-pay | Admitting: *Deleted

## 2015-01-30 NOTE — Telephone Encounter (Signed)
Premarin requires substitute or PA. Which would you prefer to do? Sub list in your IN box for review.

## 2015-01-31 DIAGNOSIS — J3089 Other allergic rhinitis: Secondary | ICD-10-CM | POA: Diagnosis not present

## 2015-01-31 NOTE — Telephone Encounter (Signed)
Let's do PA for premarin.

## 2015-02-01 NOTE — Telephone Encounter (Signed)
Form faxed. Will await determination. Spoke with patient. She will try taper.

## 2015-02-01 NOTE — Telephone Encounter (Signed)
In your IN box for completion.  

## 2015-02-01 NOTE — Telephone Encounter (Signed)
Message left for patient to return my call.  

## 2015-02-01 NOTE — Telephone Encounter (Signed)
Filled and in [facility]' box. Can pill be cut in half? If so, would suggest she do this over next several months then trial off premarin.

## 2015-02-04 NOTE — Telephone Encounter (Addendum)
[facility] prior auth dept left v/m that premarin is non formulary; estradiol is formulary and can pt be changed to estradiol; if not able to change to estradiol please call [facility] to explain why. Use Ref [id number].

## 2015-02-05 DIAGNOSIS — J3089 Other allergic rhinitis: Secondary | ICD-10-CM | POA: Diagnosis not present

## 2015-02-06 NOTE — Telephone Encounter (Signed)
Patient is going to pay out of pocket for the premarin for this Rx and taper down, but asks if there is a reason that she shouldn't be on an approved med should she require more after she tapers down.

## 2015-02-07 DIAGNOSIS — J3089 Other allergic rhinitis: Secondary | ICD-10-CM | POA: Diagnosis not present

## 2015-02-07 NOTE — Telephone Encounter (Signed)
Ok to do this. No reason, just agree with continued taper off. Insurance just wanted different generic estrogen due to cheaper tier on their formulary, it's same medication. Ok to change to estradiol when she runs out if further is needed.

## 2015-02-14 DIAGNOSIS — J3089 Other allergic rhinitis: Secondary | ICD-10-CM | POA: Diagnosis not present

## 2015-02-19 ENCOUNTER — Other Ambulatory Visit: Payer: Self-pay | Admitting: Family Medicine

## 2015-02-21 DIAGNOSIS — J3089 Other allergic rhinitis: Secondary | ICD-10-CM | POA: Diagnosis not present

## 2015-02-28 DIAGNOSIS — J3089 Other allergic rhinitis: Secondary | ICD-10-CM | POA: Diagnosis not present

## 2015-03-01 DIAGNOSIS — H2512 Age-related nuclear cataract, left eye: Secondary | ICD-10-CM | POA: Diagnosis not present

## 2015-03-01 DIAGNOSIS — H02834 Dermatochalasis of left upper eyelid: Secondary | ICD-10-CM | POA: Diagnosis not present

## 2015-03-01 DIAGNOSIS — H2511 Age-related nuclear cataract, right eye: Secondary | ICD-10-CM | POA: Diagnosis not present

## 2015-03-01 DIAGNOSIS — H02831 Dermatochalasis of right upper eyelid: Secondary | ICD-10-CM | POA: Diagnosis not present

## 2015-03-04 ENCOUNTER — Other Ambulatory Visit: Payer: Self-pay | Admitting: *Deleted

## 2015-03-04 MED ORDER — CLONAZEPAM 0.5 MG PO TABS: 0.5000 mg | ORAL_TABLET | Freq: Every day | ORAL | Status: DC

## 2015-03-04 NOTE — Telephone Encounter (Signed)
Rx called in as directed.   

## 2015-03-04 NOTE — Telephone Encounter (Signed)
Ok to refill 

## 2015-03-04 NOTE — Telephone Encounter (Signed)
plz phone in. 

## 2015-03-05 DIAGNOSIS — J3089 Other allergic rhinitis: Secondary | ICD-10-CM | POA: Diagnosis not present

## 2015-03-12 DIAGNOSIS — J3089 Other allergic rhinitis: Secondary | ICD-10-CM | POA: Diagnosis not present

## 2015-03-14 ENCOUNTER — Ambulatory Visit (INDEPENDENT_AMBULATORY_CARE_PROVIDER_SITE_OTHER): Payer: Medicare Other | Admitting: Family Medicine

## 2015-03-14 ENCOUNTER — Encounter: Payer: Self-pay | Admitting: *Deleted

## 2015-03-14 ENCOUNTER — Encounter: Payer: Self-pay | Admitting: Family Medicine

## 2015-03-14 VITALS — BP 138/72 | HR 68 | Temp 97.8°F | Wt 123.0 lb

## 2015-03-14 DIAGNOSIS — Z79899 Other long term (current) drug therapy: Secondary | ICD-10-CM | POA: Diagnosis not present

## 2015-03-14 DIAGNOSIS — M25552 Pain in left hip: Secondary | ICD-10-CM | POA: Insufficient documentation

## 2015-03-14 DIAGNOSIS — F418 Other specified anxiety disorders: Secondary | ICD-10-CM

## 2015-03-14 DIAGNOSIS — E8940 Asymptomatic postprocedural ovarian failure: Secondary | ICD-10-CM | POA: Diagnosis not present

## 2015-03-14 DIAGNOSIS — E041 Nontoxic single thyroid nodule: Secondary | ICD-10-CM

## 2015-03-14 DIAGNOSIS — Z79890 Hormone replacement therapy (postmenopausal): Secondary | ICD-10-CM

## 2015-03-14 NOTE — Patient Instructions (Addendum)
Good to see you today. You are doing well. For left hip - start aleve 1 tablet twice daily with food for 1 week. May use heating pad to tender area. Try exercises provided today. If not improving let me know for physical therapy referral. Return as needed or in 6 months for medicare wellness visit.

## 2015-03-14 NOTE — Assessment & Plan Note (Signed)
Point tender at L ASIS and iliac crest. ?bursitis. Does not give story for hip pointer. Treat with aleve 220mg  bid with food x 7 days then prn, heating pad. Provided with hip bursitis exercises, hopeful they will help. If not improved with above treatment, pt will contact me for PT referral.

## 2015-03-14 NOTE — Assessment & Plan Note (Signed)
Continues cutting down - taking 1/2 tablet of 0.45mg  at home, will try QOD dosing.

## 2015-03-14 NOTE — Progress Notes (Signed)
BP 138/72 mmHg  Pulse 68  Temp(Src) 97.8 F (36.6 C) (Oral)  Wt 123 lb (55.792 kg)   CC: 45mo f/u visit  Subjective:    Patient ID: Donna Vargas, female    DOB: [birth year], 69 y.o.   MRN: [id number]  HPI: Donna Vargas is a 69 y.o. 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  Allergic rhinitis - trouble tolerating INS. Now on allergy shots by allergist as well as xyzal and singulair.   Klonopin 0.5mg  nightly scheduled for anxiety and sleep. Several deaths around her recently.   Feeling easily fatigued.   HRT - down to 1/2 tablet daily of premarin 0.45. Doing well.  Bilateral hip pain - starts lower back and travels laterally. Feels L hip juts out more prominently than right side. Points to lateral hip as site of pain. Several years ago while sitting on hammock fell down and hit tailbone. No numbness or weakness of legs, no bowel/bladder incontinence, no fever.   Relevant past medical, surgical, family and social history reviewed and updated as indicated. Interim medical history since our last visit reviewed. Allergies and medications reviewed and updated. Current Outpatient Prescriptions on File Prior to Visit  Medication Sig  . aspirin 81 MG tablet Take 81 mg by mouth daily.  . carvedilol (COREG) 6.25 MG tablet Take 6.25 mg by mouth daily.   . clonazePAM (KLONOPIN) 0.5 MG tablet Take 1 tablet (0.5 mg total) by mouth at bedtime.  Marland Kitchen estrogens, conjugated, (PREMARIN) 0.3 MG tablet Take 1 tablet (0.3 mg total) by mouth daily. (Patient taking differently: Take 0.15 mg by mouth daily. )  . montelukast (SINGULAIR) 10 MG tablet TAKE 1 TABLET BY MOUTH AT BEDTIME.  . naproxen (NAPROSYN) 500 MG tablet Take one po bid x 5 days then prn pain, take with food   No current facility-administered medications on file prior to visit.    Review of Systems Per HPI unless specifically indicated above    Objective:    BP 138/72 mmHg  Pulse 68  Temp(Src) 97.8 F (36.6 C) (Oral)  Wt 123 lb (55.792 kg)  Wt Readings from Last 3 Encounters:  03/14/15 123 lb (55.792 kg)  09/11/14 129 lb 4 oz (58.627 kg)  08/31/14 131 lb 8 oz (59.648 kg)    Physical Exam  Constitutional: She appears well-developed and well-nourished. No distress.  HENT:  Mouth/Throat: Oropharynx is clear and moist. No oropharyngeal exudate.  Eyes: Conjunctivae and EOM are normal. Pupils are equal, round, and reactive to light.  Neck: Normal range of motion. Neck supple.  Cardiovascular: Normal rate, regular rhythm, normal heart sounds and intact distal pulses.   No murmur heard. Pulmonary/Chest: Effort normal and breath sounds normal. No respiratory distress. She has no wheezes. She has no rales.  Musculoskeletal: She exhibits no edema.  No pain midline spine No paraspinous mm tenderness Neg SLR bilaterally. No pain with int/ext rotation at hip. Neg FABER. No pain at SIJ, GTB or sciatic notch bilaterally.  Tender to palpation L iliac crest and at ASIS.  Lymphadenopathy:    She has no cervical adenopathy.  Skin: Skin is warm and dry. No rash noted.  Nursing note and vitals reviewed.  Results for orders placed or performed in visit on 10/18/14  CBC  Result Value Ref Range   WBC 5.3    HGB 12.8 g/dL   platelet count 288   Vitamin B12  Result Value Ref Range   Vitamin B12 Deficiency Panel 386   Hemoglobin A1c  Result Value Ref Range   A1c 6.0   Vit D  25 hydroxy (rtn osteoporosis monitoring)  Result Value Ref Range   Vit D, 25-Hydroxy 46    Lab Results  Component Value Date   CREATININE 0.7 09/06/2014      Assessment & Plan:   Problem List Items Addressed This Visit    Surgical menopause on hormone replacement therapy    Continues cutting down - taking 1/2 tablet of 0.45mg  at home, will try QOD dosing.      Right thyroid nodule    S/p benign biopsy.      Left hip pain - Primary    Point  tender at L ASIS and iliac crest. ?bursitis. Does not give story for hip pointer. Treat with aleve 220mg  bid with food x 7 days then prn, heating pad. Provided with hip bursitis exercises, hopeful they will help. If not improved with above treatment, pt will contact me for PT referral.      Depression with anxiety    Continue klonopin nightly. UDS and controlled substance agreement filled out today, discussed expectations.          Follow up plan: Return in about 6 months (around 09/13/2015), or as needed, for medicare wellness visit.

## 2015-03-14 NOTE — Progress Notes (Signed)
Pre visit review using our clinic review tool, if applicable. No additional management support is needed unless otherwise documented below in the visit note. 

## 2015-03-14 NOTE — Assessment & Plan Note (Signed)
S/p benign biopsy. 

## 2015-03-14 NOTE — Assessment & Plan Note (Signed)
Continue klonopin nightly. UDS and controlled substance agreement filled out today, discussed expectations.

## 2015-03-19 DIAGNOSIS — J3089 Other allergic rhinitis: Secondary | ICD-10-CM | POA: Diagnosis not present

## 2015-03-25 ENCOUNTER — Other Ambulatory Visit: Payer: Self-pay | Admitting: Family Medicine

## 2015-03-25 NOTE — Telephone Encounter (Signed)
Ok to refill? (looks 9 days early)

## 2015-03-26 ENCOUNTER — Other Ambulatory Visit: Payer: Self-pay | Admitting: *Deleted

## 2015-03-26 DIAGNOSIS — J3089 Other allergic rhinitis: Secondary | ICD-10-CM | POA: Diagnosis not present

## 2015-03-26 MED ORDER — CARVEDILOL 6.25 MG PO TABS: 6.2500 mg | ORAL_TABLET | Freq: Every day | ORAL | Status: DC

## 2015-03-26 NOTE — Telephone Encounter (Signed)
plz phone in. 

## 2015-03-27 NOTE — Telephone Encounter (Signed)
Rx called in as directed.   

## 2015-03-31 HISTORY — PX: CATARACT EXTRACTION: SUR2

## 2015-04-01 DIAGNOSIS — H2511 Age-related nuclear cataract, right eye: Secondary | ICD-10-CM | POA: Diagnosis not present

## 2015-04-01 DIAGNOSIS — H25811 Combined forms of age-related cataract, right eye: Secondary | ICD-10-CM | POA: Diagnosis not present

## 2015-04-04 DIAGNOSIS — J3089 Other allergic rhinitis: Secondary | ICD-10-CM | POA: Diagnosis not present

## 2015-04-10 DIAGNOSIS — J3089 Other allergic rhinitis: Secondary | ICD-10-CM | POA: Diagnosis not present

## 2015-04-15 DIAGNOSIS — H2512 Age-related nuclear cataract, left eye: Secondary | ICD-10-CM | POA: Diagnosis not present

## 2015-04-16 DIAGNOSIS — J3089 Other allergic rhinitis: Secondary | ICD-10-CM | POA: Diagnosis not present

## 2015-04-18 DIAGNOSIS — R21 Rash and other nonspecific skin eruption: Secondary | ICD-10-CM | POA: Diagnosis not present

## 2015-04-18 DIAGNOSIS — J3089 Other allergic rhinitis: Secondary | ICD-10-CM | POA: Diagnosis not present

## 2015-04-22 DIAGNOSIS — H25812 Combined forms of age-related cataract, left eye: Secondary | ICD-10-CM | POA: Diagnosis not present

## 2015-04-22 DIAGNOSIS — H2512 Age-related nuclear cataract, left eye: Secondary | ICD-10-CM | POA: Diagnosis not present

## 2015-04-23 ENCOUNTER — Other Ambulatory Visit: Payer: Self-pay | Admitting: Family Medicine

## 2015-04-23 NOTE — Telephone Encounter (Signed)
Ok to refill 

## 2015-04-24 NOTE — Telephone Encounter (Signed)
Rx called in as directed.   

## 2015-04-24 NOTE — Telephone Encounter (Signed)
Plz phone in

## 2015-04-25 DIAGNOSIS — J3089 Other allergic rhinitis: Secondary | ICD-10-CM | POA: Diagnosis not present

## 2015-04-26 ENCOUNTER — Encounter: Payer: Self-pay | Admitting: Family Medicine

## 2015-05-02 DIAGNOSIS — J3089 Other allergic rhinitis: Secondary | ICD-10-CM | POA: Diagnosis not present

## 2015-05-07 DIAGNOSIS — J3089 Other allergic rhinitis: Secondary | ICD-10-CM | POA: Diagnosis not present

## 2015-05-09 DIAGNOSIS — J3089 Other allergic rhinitis: Secondary | ICD-10-CM | POA: Diagnosis not present

## 2015-05-16 DIAGNOSIS — J3089 Other allergic rhinitis: Secondary | ICD-10-CM | POA: Diagnosis not present

## 2015-05-22 ENCOUNTER — Other Ambulatory Visit: Payer: Self-pay | Admitting: Family Medicine

## 2015-05-22 NOTE — Telephone Encounter (Signed)
plz phone in. 

## 2015-05-22 NOTE — Telephone Encounter (Signed)
Ok to refill 

## 2015-05-23 DIAGNOSIS — L821 Other seborrheic keratosis: Secondary | ICD-10-CM | POA: Diagnosis not present

## 2015-05-23 DIAGNOSIS — Z1283 Encounter for screening for malignant neoplasm of skin: Secondary | ICD-10-CM | POA: Diagnosis not present

## 2015-05-23 DIAGNOSIS — D229 Melanocytic nevi, unspecified: Secondary | ICD-10-CM | POA: Diagnosis not present

## 2015-05-23 DIAGNOSIS — L570 Actinic keratosis: Secondary | ICD-10-CM | POA: Diagnosis not present

## 2015-05-23 DIAGNOSIS — L820 Inflamed seborrheic keratosis: Secondary | ICD-10-CM | POA: Diagnosis not present

## 2015-05-23 DIAGNOSIS — I8393 Asymptomatic varicose veins of bilateral lower extremities: Secondary | ICD-10-CM | POA: Diagnosis not present

## 2015-05-23 DIAGNOSIS — L578 Other skin changes due to chronic exposure to nonionizing radiation: Secondary | ICD-10-CM | POA: Diagnosis not present

## 2015-05-23 DIAGNOSIS — L814 Other melanin hyperpigmentation: Secondary | ICD-10-CM | POA: Diagnosis not present

## 2015-05-23 DIAGNOSIS — J3089 Other allergic rhinitis: Secondary | ICD-10-CM | POA: Diagnosis not present

## 2015-05-23 NOTE — Telephone Encounter (Signed)
Prescription called to pharmacy.

## 2015-05-30 DIAGNOSIS — J3089 Other allergic rhinitis: Secondary | ICD-10-CM | POA: Diagnosis not present

## 2015-06-04 ENCOUNTER — Encounter: Payer: Self-pay | Admitting: Family Medicine

## 2015-06-06 DIAGNOSIS — J3089 Other allergic rhinitis: Secondary | ICD-10-CM | POA: Diagnosis not present

## 2015-06-18 ENCOUNTER — Other Ambulatory Visit: Payer: Self-pay | Admitting: *Deleted

## 2015-06-18 NOTE — Telephone Encounter (Signed)
Last filled #30 05/22/15.

## 2015-06-19 MED ORDER — CLONAZEPAM 0.5 MG PO TABS: 0.5000 mg | ORAL_TABLET | Freq: Every day | ORAL | Status: DC

## 2015-06-19 NOTE — Telephone Encounter (Signed)
Rx called to pharmacy as instructed. 

## 2015-06-19 NOTE — Telephone Encounter (Signed)
Please call in.  Thanks.   

## 2015-06-20 DIAGNOSIS — J3089 Other allergic rhinitis: Secondary | ICD-10-CM | POA: Diagnosis not present

## 2015-06-24 ENCOUNTER — Telehealth: Payer: Self-pay

## 2015-06-24 ENCOUNTER — Other Ambulatory Visit: Payer: Self-pay | Admitting: Family Medicine

## 2015-06-24 DIAGNOSIS — Z1231 Encounter for screening mammogram for malignant neoplasm of breast: Secondary | ICD-10-CM

## 2015-06-24 NOTE — Telephone Encounter (Signed)
Called and spoke with patient, and notified them that they were due for a Mammogram. Patient wanted information on [facility] at [facility]. Patient will call and schedule an appointment.

## 2015-06-25 ENCOUNTER — Other Ambulatory Visit: Payer: Self-pay | Admitting: Family Medicine

## 2015-06-25 ENCOUNTER — Ambulatory Visit
Admission: RE | Admit: 2015-06-25 | Discharge: 2015-06-25 | Disposition: A | Discharge status: Home / Self Care | Payer: Medicare Other | Source: Ambulatory Visit | Attending: Family Medicine | Admitting: Family Medicine

## 2015-06-25 DIAGNOSIS — Z1231 Encounter for screening mammogram for malignant neoplasm of breast: Secondary | ICD-10-CM | POA: Diagnosis not present

## 2015-06-25 DIAGNOSIS — J3089 Other allergic rhinitis: Secondary | ICD-10-CM | POA: Diagnosis not present

## 2015-06-28 ENCOUNTER — Encounter: Payer: Self-pay | Admitting: *Deleted

## 2015-06-28 NOTE — Progress Notes (Signed)
06/25/15 Mammogram results in [facility] [place]

## 2015-07-01 LAB — HM MAMMOGRAPHY: HM MAMMO: NORMAL

## 2015-07-02 ENCOUNTER — Encounter: Payer: Self-pay | Admitting: *Deleted

## 2015-07-04 DIAGNOSIS — J3089 Other allergic rhinitis: Secondary | ICD-10-CM | POA: Diagnosis not present

## 2015-07-09 ENCOUNTER — Ambulatory Visit (INDEPENDENT_AMBULATORY_CARE_PROVIDER_SITE_OTHER): Payer: Medicare Other | Admitting: Primary Care

## 2015-07-09 ENCOUNTER — Encounter: Payer: Self-pay | Admitting: Primary Care

## 2015-07-09 VITALS — BP 138/86 | HR 72 | Temp 97.7°F | Ht 63.0 in | Wt 121.8 lb

## 2015-07-09 DIAGNOSIS — F419 Anxiety disorder, unspecified: Principal | ICD-10-CM

## 2015-07-09 DIAGNOSIS — F329 Major depressive disorder, single episode, unspecified: Secondary | ICD-10-CM

## 2015-07-09 DIAGNOSIS — F418 Other specified anxiety disorders: Secondary | ICD-10-CM

## 2015-07-09 DIAGNOSIS — J3089 Other allergic rhinitis: Secondary | ICD-10-CM | POA: Diagnosis not present

## 2015-07-09 MED ORDER — SERTRALINE HCL 50 MG PO TABS: 50.0000 mg | ORAL_TABLET | Freq: Every day | ORAL | Status: DC

## 2015-07-09 NOTE — Patient Instructions (Signed)
Start Zoloft (sertraline) for anxiety and depression. Take 1/2 tablet by mouth daily for 6 days, then advance to 1 full tablet thereafter.  Please notify us when you need refills of clonazepam, and also if you'd like to speak with a therapist in our office.  Follow up in 4 weeks for re-evaluation.  It was a pleasure meeting you!

## 2015-07-09 NOTE — Assessment & Plan Note (Addendum)
Increased anxiety with some signs of depression. She is under a lot of stress with her family including caring for her ill mother and mediating with grandchildren. Currently taking clonazepam HS but has required twice daily dosing for the past several weeks. PHQ-9 score of 9 today, she exhibits most of the sign consistent with a diagnosis of GAD. Denies SI/HI She declines therapy. Discussed the importance of a daily medication to help level her anxiety and to use clonazepam PRN. Started Zoloft 50 mg. 1/2 tab daily x 6 days, then 1 full tab thereafter. Discussed potential side effects. Advised her to call us when she requires refills of clonazepam.  She is to follow up with me in 4 weeks for further evaluation.

## 2015-07-09 NOTE — Progress Notes (Signed)
Subjective:    Patient ID: Donna Vargas, female    DOB: July 30, 1946, 69 y.o.   MRN: [id number]  HPI  Donna Vargas is a 69 year old female who presents today with a chief complaint of fatigue and stress. She is currently dealing with a lot of family stress including caring for her ill mother. She reports daytime tiredness, often doesn't feeling like doing her daily activities, endures daily worry, and daily irritability. She has been taking clonazepam as prescribed but has been requiring more of her medication recently and is running low. She is tired of having to hold her family together and feels as though something has to change. PHQ-9 score of 9 in the office today. Denies SI/HI.  Review of Systems  Constitutional: Negative for fever, chills and unexpected weight change.  HENT: Negative for rhinorrhea.   Respiratory: Negative for cough and shortness of breath.   Cardiovascular: Negative for chest pain.  Allergic/Immunologic: Positive for environmental allergies.  Neurological: Negative for dizziness and headaches.  Psychiatric/Behavioral: Negative for suicidal ideas. The patient is nervous/anxious.        Past Medical History  Diagnosis Date  . Perennial allergic rhinitis     allergy shots (sharma)  . History of asthma 1990s    with bronchitis  . Depression with anxiety   . Arthritis     fingers  . History of diverticulitis     ?IBS  . Hypertension   . Hypothyroidism 2015    ?by prior PCP  . Right thyroid nodule 09/11/2014    S/p biopsy showing benign colloid nodule (08/2014)   . Prediabetes 06/2014  . Skin cancer 08/2010    L back - SCC    History   Social History  . Marital Status: Married    Spouse Name: N/A  . Number of Children: N/A  . Years of Education: N/A   Occupational History  . Not on file.   Social History Main Topics  . Smoking status: Never Smoker   . Smokeless tobacco: Never Used  . Alcohol Use: No  . Drug Use: No  . Sexual Activity:  Not on file   Other Topics Concern  . 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 Will bring me copy. HCPOA is husband.    Past Surgical History  Procedure Laterality Date  . Oophorectomy  1990s    growth on ovary - benign  . Total abdominal hysterectomy  1995    both ovaries removed. CA125 level elevated - but no cancer  . Colonoscopy  2009    [facility] per prior pcp records but never received report  . Cataract extraction Left 03/2015    [facility]    Family History  Problem Relation Age of Onset  . Cancer Mother     ovarian x2  . Ovarian cancer Mother   . CAD Father     unclear  . Cancer Maternal Grandmother     breast  . Breast cancer Maternal Grandmother   . CAD Maternal Grandfather   . Hypertension Neg Hx   . Diabetes Neg Hx   . Breast cancer Cousin     Allergies  Allergen Reactions  . Hctz [  Hydrochlorothiazide]     Blood came to the surface of her legs  . Sulfa Antibiotics Rash    Current Outpatient Prescriptions on File Prior to Visit  Medication Sig Dispense Refill  . aspirin 81 MG tablet Take 81 mg by mouth daily.    . carvedilol (COREG) 6.25 MG tablet Take 1 tablet (6.25 mg total) by mouth daily. 30 tablet 3  . clonazePAM (KLONOPIN) 0.5 MG tablet Take 1 tablet (0.5 mg total) by mouth at bedtime. 30 tablet 0  . diphenhydrAMINE (BENADRYL) 25 mg capsule Take 25 mg by mouth at bedtime.    Marland Kitchen estrogens, conjugated, (PREMARIN) 0.3 MG tablet Take 1 tablet (0.3 mg total) by mouth daily. (Patient taking differently: Take 0.15 mg by mouth daily. ) 30 tablet 6  . levocetirizine (XYZAL) 5 MG tablet Take 5 mg by mouth at bedtime.    . montelukast (SINGULAIR) 10 MG tablet TAKE 1 TABLET BY MOUTH AT  BEDTIME. 30 tablet 3  . naproxen (NAPROSYN) 500 MG tablet Take one po bid x 5 days then prn pain, take with food 40 tablet 0   No current facility-administered medications on file prior to visit.    BP 138/86 mmHg  Pulse 72  Temp(Src) 97.7 F (36.5 C) (Oral)  Ht 5\' 3"  (1.6 m)  Wt 121 lb 12.8 oz (55.248 kg)  BMI 21.58 kg/m2  SpO2 98%    Objective:   Physical Exam  Constitutional: She is oriented to person, place, and time. She appears well-nourished. She does not appear ill.  Cardiovascular: Normal rate and regular rhythm.   Pulmonary/Chest: Effort normal and breath sounds normal.  Neurological: She is alert and oriented to person, place, and time.  Skin: Skin is warm and dry.  Psychiatric:  Appears quite anxious and stressed.          Assessment & Plan:

## 2015-07-09 NOTE — Progress Notes (Signed)
Pre visit review using our clinic review tool, if applicable. No additional management support is needed unless otherwise documented below in the visit note. 

## 2015-07-19 DIAGNOSIS — J3089 Other allergic rhinitis: Secondary | ICD-10-CM | POA: Diagnosis not present

## 2015-07-22 ENCOUNTER — Other Ambulatory Visit: Payer: Self-pay | Admitting: Family Medicine

## 2015-07-22 NOTE — Telephone Encounter (Signed)
plz phone in. 

## 2015-07-22 NOTE — Telephone Encounter (Signed)
Rx called to pharmacy as instructed. 

## 2015-07-23 DIAGNOSIS — J3089 Other allergic rhinitis: Secondary | ICD-10-CM | POA: Diagnosis not present

## 2015-08-01 DIAGNOSIS — J3089 Other allergic rhinitis: Secondary | ICD-10-CM | POA: Diagnosis not present

## 2015-08-02 ENCOUNTER — Other Ambulatory Visit: Payer: Self-pay | Admitting: Family Medicine

## 2015-08-06 DIAGNOSIS — J3089 Other allergic rhinitis: Secondary | ICD-10-CM | POA: Diagnosis not present

## 2015-08-07 ENCOUNTER — Ambulatory Visit: Payer: Medicare Other | Admitting: Primary Care

## 2015-08-12 ENCOUNTER — Encounter: Payer: Self-pay | Admitting: Primary Care

## 2015-08-12 ENCOUNTER — Ambulatory Visit (INDEPENDENT_AMBULATORY_CARE_PROVIDER_SITE_OTHER): Payer: Medicare Other | Admitting: Primary Care

## 2015-08-12 VITALS — BP 132/76 | HR 64 | Temp 98.5°F | Ht 62.75 in | Wt 119.8 lb

## 2015-08-12 DIAGNOSIS — F419 Anxiety disorder, unspecified: Principal | ICD-10-CM

## 2015-08-12 DIAGNOSIS — F418 Other specified anxiety disorders: Secondary | ICD-10-CM

## 2015-08-12 DIAGNOSIS — F329 Major depressive disorder, single episode, unspecified: Secondary | ICD-10-CM

## 2015-08-12 MED ORDER — SERTRALINE HCL 50 MG PO TABS: 25.0000 mg | ORAL_TABLET | Freq: Every day | ORAL | Status: DC

## 2015-08-12 NOTE — Progress Notes (Signed)
Subjective:    Patient ID: Donna Vargas, female    DOB: Dec 13, 1945, 69 y.o.   MRN: [id number]  HPI  Donna Vargas is a 69 year old female who presents today for follow up. She was evaluated on 07/09/2015 with increased anxiety and signs of depression and was requiring more of her clonazepam than usual. She was not managed on daily medication for anxiety as her anxiety was less frequent. She was going through a lot of stress caring for her ill mother and mediating with her grandchildren. She was initiated on Zoloft 50 mg for her symptoms during last visit.  Since her last visit she's feeling much better. She is still caring for her ill mother and dealing with family stress with grandchildren. She's been taking 25 mg daily and feels well managed at this dose. She continues to take her clonazepam at night for sleep. Denies SI/HI, headaches, dizziness, GI upset.  Review of Systems  Respiratory: Negative for shortness of breath.   Cardiovascular: Negative for chest pain.  Neurological: Negative for dizziness and headaches.       Past Medical History  Diagnosis Date  . Perennial allergic rhinitis     allergy shots (sharma)  . History of asthma 1990s    with bronchitis  . Depression with anxiety   . Arthritis     fingers  . History of diverticulitis     ?IBS  . Hypertension   . Hypothyroidism 2015    ?by prior PCP  . Right thyroid nodule 09/11/2014    S/p biopsy showing benign colloid nodule (08/2014)   . Prediabetes 06/2014  . 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 Marital Status: Married    Spouse Name: N/A  . Number of Children: N/A  . Years of Education: N/A   Occupational History  . Not on file.   Social History Main Topics  . Smoking status: Never Smoker   . Smokeless tobacco: Never Used  . Alcohol Use: No  . Drug Use: No  . Sexual Activity: Not on file   Other Topics Concern  . 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 Will bring me copy. HCPOA is husband.    Past Surgical History  Procedure Laterality Date  . Oophorectomy  1990s    growth on ovary - benign  . Total abdominal hysterectomy  1995    both ovaries removed. CA125 level elevated - but no cancer  . Colonoscopy  2009    [facility] per prior pcp records but never received report  . Cataract extraction Left 03/2015    [facility]    Family History  Problem Relation Age of Onset  . Cancer Mother     ovarian x2  . Ovarian cancer Mother   . CAD Father     unclear  . Cancer Maternal Grandmother     breast  . Breast cancer Maternal Grandmother   . CAD Maternal Grandfather   . Hypertension Neg Hx   . Diabetes Neg Hx   . Breast cancer Cousin     Allergies  Allergen Reactions  . Hctz [  Hydrochlorothiazide]     Blood came to the surface of her legs  . Sulfa Antibiotics Rash    Current Outpatient Prescriptions on File Prior to Visit  Medication Sig Dispense Refill  . aspirin 81 MG tablet Take 81 mg by mouth daily.    . carvedilol (COREG) 6.25 MG tablet TAKE 1 TABLET (6.25 MG TOTAL) BY MOUTH DAILY. 30 tablet 3  . clonazePAM (KLONOPIN) 0.5 MG tablet TAKE 1 TABLET BY MOUTH AT BEDTIME. 30 tablet 0  . diphenhydrAMINE (BENADRYL) 25 mg capsule Take 25 mg by mouth at bedtime.    Marland Kitchen levocetirizine (XYZAL) 5 MG tablet Take 5 mg by mouth at bedtime.    . montelukast (SINGULAIR) 10 MG tablet TAKE 1 TABLET BY MOUTH AT BEDTIME. 30 tablet 3  . naproxen (NAPROSYN) 500 MG tablet Take one po bid x 5 days then prn pain, take with food 40 tablet 0  . PREMARIN 0.3 MG tablet TAKE 1 TABLET (0.3 MG TOTAL) BY MOUTH DAILY. 30 tablet 6   No current facility-administered medications on file prior  to visit.    BP 132/76 mmHg  Pulse 64  Temp(Src) 98.5 F (36.9 C) (Oral)  Ht 5' 2.75" (1.594 m)  Wt 119 lb 12.8 oz (54.341 kg)  BMI 21.39 kg/m2  SpO2 97%    Objective:   Physical Exam  Constitutional: She is oriented to person, place, and time. She appears well-nourished.  Cardiovascular: Normal rate and regular rhythm.   Pulmonary/Chest: Effort normal and breath sounds normal.  Neurological: She is alert and oriented to person, place, and time.  Skin: Skin is warm and dry.  Psychiatric: She has a normal mood and affect.          Assessment & Plan:

## 2015-08-12 NOTE — Patient Instructions (Signed)
Continue Sertraline (Zoloft) for anxiety. You may take 1/2 tablet daily as you are already doing so.  Follow up with Dr. Danise Mina in 3 months for re-evaluation of anxiety and depression.  It was a pleasure to see you today!

## 2015-08-12 NOTE — Assessment & Plan Note (Addendum)
Much improved on Zoloft. She is taking 1/2 tab (25mg ) and feels well managed at this dose. Continue clonazepam HS. Corrected instructions on current Zoloft prescription to reflect current dose. She will follow up with PCP as scheduled in October.  Denies SI/HI, headaches, Gi upset.

## 2015-08-12 NOTE — Progress Notes (Signed)
Pre visit review using our clinic review tool, if applicable. No additional management support is needed unless otherwise documented below in the visit note. 

## 2015-08-13 DIAGNOSIS — J3089 Other allergic rhinitis: Secondary | ICD-10-CM | POA: Diagnosis not present

## 2015-08-19 ENCOUNTER — Other Ambulatory Visit: Payer: Self-pay | Admitting: Family Medicine

## 2015-08-20 ENCOUNTER — Other Ambulatory Visit: Payer: Self-pay | Admitting: *Deleted

## 2015-08-20 DIAGNOSIS — J3089 Other allergic rhinitis: Secondary | ICD-10-CM | POA: Diagnosis not present

## 2015-08-20 MED ORDER — CLONAZEPAM 0.5 MG PO TABS: 0.5000 mg | ORAL_TABLET | Freq: Every day | ORAL | Status: DC

## 2015-08-20 NOTE — Telephone Encounter (Signed)
plz phone in. 

## 2015-08-20 NOTE — Telephone Encounter (Signed)
Rx called in as directed.   

## 2015-08-20 NOTE — Telephone Encounter (Signed)
Ok to refill 

## 2015-08-29 DIAGNOSIS — J3089 Other allergic rhinitis: Secondary | ICD-10-CM | POA: Diagnosis not present

## 2015-09-03 DIAGNOSIS — J3089 Other allergic rhinitis: Secondary | ICD-10-CM | POA: Diagnosis not present

## 2015-09-10 DIAGNOSIS — J3089 Other allergic rhinitis: Secondary | ICD-10-CM | POA: Diagnosis not present

## 2015-09-13 ENCOUNTER — Other Ambulatory Visit: Payer: Self-pay | Admitting: Family Medicine

## 2015-09-13 ENCOUNTER — Other Ambulatory Visit (INDEPENDENT_AMBULATORY_CARE_PROVIDER_SITE_OTHER): Payer: Medicare Other

## 2015-09-13 DIAGNOSIS — E041 Nontoxic single thyroid nodule: Secondary | ICD-10-CM | POA: Diagnosis not present

## 2015-09-13 DIAGNOSIS — I10 Essential (primary) hypertension: Secondary | ICD-10-CM | POA: Diagnosis not present

## 2015-09-13 DIAGNOSIS — R7303 Prediabetes: Secondary | ICD-10-CM

## 2015-09-13 LAB — BASIC METABOLIC PANEL
BUN: 14 mg/dL (ref 6–23)
CHLORIDE: 104 meq/L (ref 96–112)
CO2: 28 meq/L (ref 19–32)
CREATININE: 0.77 mg/dL (ref 0.40–1.20)
Calcium: 9.1 mg/dL (ref 8.4–10.5)
GFR: 79.48 mL/min (ref >60.00)
Glucose, Bld: 86 mg/dL (ref 70–99)
POTASSIUM: 4 meq/L (ref 3.5–5.1)
Sodium: 140 mEq/L (ref 135–145)

## 2015-09-13 LAB — LIPID PANEL
CHOL/HDL RATIO: 4
CHOLESTEROL: 223 mg/dL — AB (ref 0–200)
HDL: 56.2 mg/dL (ref >39.00)
LDL CALC: 153 mg/dL — AB (ref 0–99)
NonHDL: 166.38
Triglycerides: 69 mg/dL (ref 0.0–149.0)
VLDL: 13.8 mg/dL (ref 0.0–40.0)

## 2015-09-13 LAB — HEMOGLOBIN A1C: Hgb A1c MFr Bld: 5.9 % (ref 4.6–6.5)

## 2015-09-13 LAB — TSH: TSH: 3.29 u[IU]/mL (ref 0.35–4.50)

## 2015-09-16 ENCOUNTER — Encounter: Payer: Self-pay | Admitting: *Deleted

## 2015-09-17 ENCOUNTER — Encounter: Payer: Medicare Other | Admitting: Family Medicine

## 2015-09-17 ENCOUNTER — Other Ambulatory Visit: Payer: Self-pay | Admitting: *Deleted

## 2015-09-17 NOTE — Telephone Encounter (Signed)
Ok to refill 

## 2015-09-18 MED ORDER — CLONAZEPAM 0.5 MG PO TABS: 0.5000 mg | ORAL_TABLET | Freq: Every day | ORAL | Status: DC

## 2015-09-18 NOTE — Telephone Encounter (Signed)
plz phone in. 

## 2015-09-18 NOTE — Telephone Encounter (Signed)
Rx called in as directed.   

## 2015-09-19 DIAGNOSIS — J3089 Other allergic rhinitis: Secondary | ICD-10-CM | POA: Diagnosis not present

## 2015-09-24 DIAGNOSIS — J3089 Other allergic rhinitis: Secondary | ICD-10-CM | POA: Diagnosis not present

## 2015-09-30 DIAGNOSIS — J3089 Other allergic rhinitis: Secondary | ICD-10-CM | POA: Diagnosis not present

## 2015-10-01 DIAGNOSIS — J3089 Other allergic rhinitis: Secondary | ICD-10-CM | POA: Diagnosis not present

## 2015-10-10 DIAGNOSIS — J3089 Other allergic rhinitis: Secondary | ICD-10-CM | POA: Diagnosis not present

## 2015-10-15 ENCOUNTER — Other Ambulatory Visit: Payer: Self-pay | Admitting: *Deleted

## 2015-10-15 MED ORDER — CLONAZEPAM 0.5 MG PO TABS: 0.5000 mg | ORAL_TABLET | Freq: Every day | ORAL | Status: DC

## 2015-10-15 NOTE — Telephone Encounter (Signed)
plz phone in. 

## 2015-10-15 NOTE — Telephone Encounter (Signed)
Ok to refill 

## 2015-10-16 NOTE — Telephone Encounter (Signed)
Rx called in as directed.   

## 2015-10-17 ENCOUNTER — Ambulatory Visit (INDEPENDENT_AMBULATORY_CARE_PROVIDER_SITE_OTHER): Payer: Medicare Other | Admitting: Family Medicine

## 2015-10-17 ENCOUNTER — Encounter: Payer: Self-pay | Admitting: Family Medicine

## 2015-10-17 ENCOUNTER — Encounter: Payer: Medicare Other | Admitting: Family Medicine

## 2015-10-17 VITALS — BP 146/76 | HR 68 | Temp 98.2°F | Ht 63.0 in | Wt 116.0 lb

## 2015-10-17 DIAGNOSIS — R7303 Prediabetes: Secondary | ICD-10-CM

## 2015-10-17 DIAGNOSIS — Z7189 Other specified counseling: Secondary | ICD-10-CM

## 2015-10-17 DIAGNOSIS — E8940 Asymptomatic postprocedural ovarian failure: Secondary | ICD-10-CM | POA: Diagnosis not present

## 2015-10-17 DIAGNOSIS — Z0000 Encounter for general adult medical examination without abnormal findings: Secondary | ICD-10-CM | POA: Diagnosis not present

## 2015-10-17 DIAGNOSIS — Z79890 Hormone replacement therapy (postmenopausal): Secondary | ICD-10-CM

## 2015-10-17 DIAGNOSIS — R634 Abnormal weight loss: Secondary | ICD-10-CM

## 2015-10-17 DIAGNOSIS — Z1211 Encounter for screening for malignant neoplasm of colon: Secondary | ICD-10-CM

## 2015-10-17 DIAGNOSIS — I10 Essential (primary) hypertension: Secondary | ICD-10-CM | POA: Diagnosis not present

## 2015-10-17 DIAGNOSIS — F331 Major depressive disorder, recurrent, moderate: Secondary | ICD-10-CM | POA: Diagnosis not present

## 2015-10-17 DIAGNOSIS — J3089 Other allergic rhinitis: Secondary | ICD-10-CM | POA: Diagnosis not present

## 2015-10-17 DIAGNOSIS — E785 Hyperlipidemia, unspecified: Secondary | ICD-10-CM | POA: Insufficient documentation

## 2015-10-17 NOTE — Assessment & Plan Note (Signed)
Reviewed significant weight loss over last 1-2 yrs. Pt not concerned - attributes to increased activity and stress since retirement. However endorses decreased appetite. No other sxs endorsed. UTD mammogram, s/p complete hysterectomy. I will send her home with iFOB and have her return in 3-4 mo for recheck weight.

## 2015-10-17 NOTE — Patient Instructions (Addendum)
Sign release for records of colonoscopy 2009 with Dr Amedeo Plenty Think about pneumonia shot and let me know if you want to receive - helps prevent bacterial pneumonia (prevnar and pneumovax). Bring me copy of living will at your convenience. Pass by lab for a stool kit.  Return in 3-4 months for follow up -recheck weight. Good to see you today, call us with questions.  Health Maintenance, Female Adopting a healthy lifestyle and getting preventive care can go a long way to promote health and wellness. Talk with your health care provider about what schedule of regular examinations is right for you. This is a good chance for you to check in with your provider about disease prevention and staying healthy. In between checkups, there are plenty of things you can do on your own. Experts have done a lot of research about which lifestyle changes and preventive measures are most likely to keep you healthy. Ask your health care provider for more information. WEIGHT AND DIET  Eat a healthy diet  Be sure to include plenty of vegetables, fruits, low-fat dairy products, and lean protein.  Do not eat a lot of foods high in solid fats, added sugars, or salt.  Get regular exercise. This is one of the most important things you can do for your health.  Most adults should exercise for at least 150 minutes each week. The exercise should increase your heart rate and make you sweat (moderate-intensity exercise).  Most adults should also do strengthening exercises at least twice a week. This is in addition to the moderate-intensity exercise.  Maintain a healthy weight  Body mass index (BMI) is a measurement that can be used to identify possible weight problems. It estimates body fat based on height and weight. Your health care provider can help determine your BMI and help you achieve or maintain a healthy weight.  For females 51 years of age and older:   A BMI below 18.5 is considered underweight.  A BMI of 18.5 to  24.9 is normal.  A BMI of 25 to 29.9 is considered overweight.  A BMI of 30 and above is considered obese.  Watch levels of cholesterol and blood lipids  You should start having your blood tested for lipids and cholesterol at 67 years of age, then have this test every 5 years.  You may need to have your cholesterol levels checked more often if:  Your lipid or cholesterol levels are high.  You are older than 67 years of age.  You are at high risk for heart disease.  CANCER SCREENING   Lung Cancer  Lung cancer screening is recommended for adults 35-40 years old who are at high risk for lung cancer because of a history of smoking.  A yearly low-dose CT scan of the lungs is recommended for people who:  Currently smoke.  Have quit within the past 15 years.  Have at least a 30-pack-year history of smoking. A pack year is smoking an average of one pack of cigarettes a day for 1 year.  Yearly screening should continue until it has been 15 years since you quit.  Yearly screening should stop if you develop a health problem that would prevent you from having lung cancer treatment.  Breast Cancer  Practice breast self-awareness. This means understanding how your breasts normally appear and feel.  It also means doing regular breast self-exams. Let your health care provider know about any changes, no matter how small.  If you are in your 7s or  48s, you should have a clinical breast exam (CBE) by a health care provider every 1-3 years as part of a regular health exam.  If you are 33 or older, have a CBE every year. Also consider having a breast X-ray (mammogram) every year.  If you have a family history of breast cancer, talk to your health care provider about genetic screening.  If you are at high risk for breast cancer, talk to your health care provider about having an MRI and a mammogram every year.  Breast cancer gene (BRCA) assessment is recommended for women who have family  members with BRCA-related cancers. BRCA-related cancers include:  Breast.  Ovarian.  Tubal.  Peritoneal cancers.  Results of the assessment will determine the need for genetic counseling and BRCA1 and BRCA2 testing. Cervical Cancer Your health care provider may recommend that you be screened regularly for cancer of the pelvic organs (ovaries, uterus, and vagina). This screening involves a pelvic examination, including checking for microscopic changes to the surface of your cervix (Pap test). You may be encouraged to have this screening done every 3 years, beginning at age 21.  For women ages 26-65, health care providers may recommend pelvic exams and Pap testing every 3 years, or they may recommend the Pap and pelvic exam, combined with testing for human papilloma virus (HPV), every 5 years. Some types of HPV increase your risk of cervical cancer. Testing for HPV may also be done on women of any age with unclear Pap test results.  Other health care providers may not recommend any screening for nonpregnant women who are considered low risk for pelvic cancer and who do not have symptoms. Ask your health care provider if a screening pelvic exam is right for you.  If you have had past treatment for cervical cancer or a condition that could lead to cancer, you need Pap tests and screening for cancer for at least 20 years after your treatment. If Pap tests have been discontinued, your risk factors (such as having a new sexual partner) need to be reassessed to determine if screening should resume. Some women have medical problems that increase the chance of getting cervical cancer. In these cases, your health care provider may recommend more frequent screening and Pap tests. Colorectal Cancer  This type of cancer can be detected and often prevented.  Routine colorectal cancer screening usually begins at 67 years of age and continues through 67 years of age.  Your health care provider may recommend  screening at an earlier age if you have risk factors for colon cancer.  Your health care provider may also recommend using home test kits to check for hidden blood in the stool.  A small camera at the end of a tube can be used to examine your colon directly (sigmoidoscopy or colonoscopy). This is done to check for the earliest forms of colorectal cancer.  Routine screening usually begins at age 21.  Direct examination of the colon should be repeated every 5-10 years through 67 years of age. However, you may need to be screened more often if early forms of precancerous polyps or small growths are found. Skin Cancer  Check your skin from head to toe regularly.  Tell your health care provider about any new moles or changes in moles, especially if there is a change in a mole's shape or color.  Also tell your health care provider if you have a mole that is larger than the size of a pencil eraser.  Always  use sunscreen. Apply sunscreen liberally and repeatedly throughout the day.  Protect yourself by wearing long sleeves, pants, a wide-brimmed hat, and sunglasses whenever you are outside. HEART DISEASE, DIABETES, AND HIGH BLOOD PRESSURE   High blood pressure causes heart disease and increases the risk of stroke. High blood pressure is more likely to develop in:  People who have blood pressure in the high end of the normal range (130-139/85-89 mm Hg).  People who are overweight or obese.  People who are African American.  If you are 45-69 years of age, have your blood pressure checked every 3-5 years. If you are 45 years of age or older, have your blood pressure checked every year. You should have your blood pressure measured twice--once when you are at a hospital or clinic, and once when you are not at a hospital or clinic. Record the average of the two measurements. To check your blood pressure when you are not at a hospital or clinic, you can use:  An automated blood pressure machine at a  pharmacy.  A home blood pressure monitor.  If you are between 78 years and 62 years old, ask your health care provider if you should take aspirin to prevent strokes.  Have regular diabetes screenings. This involves taking a blood sample to check your fasting blood sugar level.  If you are at a normal weight and have a low risk for diabetes, have this test once every three years after 67 years of age.  If you are overweight and have a high risk for diabetes, consider being tested at a younger age or more often. PREVENTING INFECTION  Hepatitis B  If you have a higher risk for hepatitis B, you should be screened for this virus. You are considered at high risk for hepatitis B if:  You were born in a country where hepatitis B is common. Ask your health care provider which countries are considered high risk.  Your parents were born in a high-risk country, and you have not been immunized against hepatitis B (hepatitis B vaccine).  You have HIV or AIDS.  You use needles to inject street drugs.  You live with someone who has hepatitis B.  You have had sex with someone who has hepatitis B.  You get hemodialysis treatment.  You take certain medicines for conditions, including cancer, organ transplantation, and autoimmune conditions. Hepatitis C  Blood testing is recommended for:  Everyone born from [birth year] through 1963.  Anyone with known risk factors for hepatitis C. Sexually transmitted infections (STIs)  You should be screened for sexually transmitted infections (STIs) including gonorrhea and chlamydia if:  You are sexually active and are younger than 67 years of age.  You are older than 67 years of age and your health care provider tells you that you are at risk for this type of infection.  Your sexual activity has changed since you were last screened and you are at an increased risk for chlamydia or gonorrhea. Ask your health care provider if you are at risk.  If you do not  have HIV, but are at risk, it may be recommended that you take a prescription medicine daily to prevent HIV infection. This is called pre-exposure prophylaxis (PrEP). You are considered at risk if:  You are sexually active and do not regularly use condoms or know the HIV status of your partner(s).  You take drugs by injection.  You are sexually active with a partner who has HIV. Talk with your health care  provider about whether you are at high risk of being infected with HIV. If you choose to begin PrEP, you should first be tested for HIV. You should then be tested every 3 months for as long as you are taking PrEP.  PREGNANCY   If you are premenopausal and you may become pregnant, ask your health care provider about preconception counseling.  If you may become pregnant, take 400 to 800 micrograms (mcg) of folic acid every day.  If you want to prevent pregnancy, talk to your health care provider about birth control (contraception). OSTEOPOROSIS AND MENOPAUSE   Osteoporosis is a disease in which the bones lose minerals and strength with aging. This can result in serious bone fractures. Your risk for osteoporosis can be identified using a bone density scan.  If you are 47 years of age or older, or if you are at risk for osteoporosis and fractures, ask your health care provider if you should be screened.  Ask your health care provider whether you should take a calcium or vitamin D supplement to lower your risk for osteoporosis.  Menopause may have certain physical symptoms and risks.  Hormone replacement therapy may reduce some of these symptoms and risks. Talk to your health care provider about whether hormone replacement therapy is right for you.  HOME CARE INSTRUCTIONS   Schedule regular health, dental, and eye exams.  Stay current with your immunizations.   Do not use any tobacco products including cigarettes, chewing tobacco, or electronic cigarettes.  If you are pregnant, do not  drink alcohol.  If you are breastfeeding, limit how much and how often you drink alcohol.  Limit alcohol intake to no more than 1 drink per day for nonpregnant women. One drink equals 12 ounces of beer, 5 ounces of wine, or 1 ounces of hard liquor.  Do not use street drugs.  Do not share needles.  Ask your health care provider for help if you need support or information about quitting drugs.  Tell your health care provider if you often feel depressed.  Tell your health care provider if you have ever been abused or do not feel safe at home.   This information is not intended to replace advice given to you by your health care provider. Make sure you discuss any questions you have with your health care provider.   Document Released: 06/01/2011 Document Revised: 12/07/2014 Document Reviewed: 10/18/2013 Elsevier Interactive Patient Education [employer].

## 2015-10-17 NOTE — Assessment & Plan Note (Signed)
rec avoid added sugars and sweetened beverages.

## 2015-10-17 NOTE — Assessment & Plan Note (Signed)
Chronic, stable. Continue carvedilol 6.25mg  bid.

## 2015-10-17 NOTE — Assessment & Plan Note (Signed)
Reviewed with patient, rec low chol diet. Handout provided.

## 2015-10-17 NOTE — Assessment & Plan Note (Signed)

## 2015-10-17 NOTE — Progress Notes (Signed)
Pre visit review using our clinic review tool, if applicable. No additional management support is needed unless otherwise documented below in the visit note. 

## 2015-10-17 NOTE — Progress Notes (Signed)
BP 146/76 mmHg  Pulse 68  Temp(Src) 98.2 F (36.8 C) (Oral)  Ht 5\' 3"  (1.6 m)  Wt 116 lb (52.617 kg)  BMI 20.55 kg/m2   CC: medicare wellness visit  Subjective:    Patient ID: Donna Vargas, female    DOB: 03-14-1946, 69 y.o.   MRN: [id number]  HPI: Donna Vargas is a 69 y.o. female presenting on 10/17/2015 for Annual Exam   Recently seen by Anda Kraft for worsening anxiety/depression - started on zoloft 25mg  daily and doing better. Continues clonazepam nightly for sleep.  Weight loss noted over last year. 08/2014 - 131lbs. Peak weight 149lbs 2 yrs ago. Attributes to increased activity (active on farm) and family stress. Endorses decreased appetite. No fevers, night sweats, abd pain, headache, dizziness, dysphagia, chest pain.  Hearing screen passed. Eye exam with eye doctor Denies falls. Denies depression/anhedonia, sadness  Preventative:  Colonoscopy 2009 Amedeo Plenty) normal per patient. No records Pap smear 2014 - wants PCP to do this. Consider rpt 2017 - pelvic.  TOTAL ABDOMINAL HYSTERECTOMY Date: 1995 both ovaries removed. CA125 level elevated - but no cancer Breast cancer screening - normal 1 year ago. mammo 07/2015. Breast exams at home.  DEXA - had one and normal per patient. Declines recheck for now.  Flu shot declines Pneumonia shot - declines Tdap 06/2014 zostavax - declines. Has had shingles. Advanced directive: has at home. Will bring me copy. HCPOA is husband. Seat belt use discussed Sunscreen use discussed. No changing moles on skin. 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 Diet: no water, some fruits/vegetables  Relevant past medical, surgical, family and social history reviewed and updated as indicated. Interim medical history since our last visit reviewed. Allergies and medications reviewed and  updated. Current Outpatient Prescriptions on File Prior to Visit  Medication Sig  . aspirin 81 MG tablet Take 81 mg by mouth daily.  . carvedilol (COREG) 6.25 MG tablet TAKE 1 TABLET (6.25 MG TOTAL) BY MOUTH DAILY.  . clonazePAM (KLONOPIN) 0.5 MG tablet Take 1 tablet (0.5 mg total) by mouth at bedtime.  . diphenhydrAMINE (BENADRYL) 25 mg capsule Take 25 mg by mouth at bedtime.  Marland Kitchen levocetirizine (XYZAL) 5 MG tablet Take 5 mg by mouth at bedtime.  . montelukast (SINGULAIR) 10 MG tablet TAKE 1 TABLET BY MOUTH AT BEDTIME.  . naproxen (NAPROSYN) 500 MG tablet Take one po bid x 5 days then prn pain, take with food  . PREMARIN 0.3 MG tablet TAKE 1 TABLET (0.3 MG TOTAL) BY MOUTH DAILY. (Patient taking differently: TAKE 1/2 TABLET (0.15 MG TOTAL) BY MOUTH DAILY.)  . sertraline (ZOLOFT) 50 MG tablet Take 0.5 tablets (25 mg total) by mouth daily.   No current facility-administered medications on file prior to visit.    Review of Systems Per HPI unless specifically indicated in ROS section     Objective:    BP 146/76 mmHg  Pulse 68  Temp(Src) 98.2 F (36.8 C) (Oral)  Ht 5\' 3"  (1.6 m)  Wt 116 lb (52.617 kg)  BMI 20.55 kg/m2  Wt Readings from Last 3 Encounters:  10/17/15 116 lb (52.617 kg)  08/12/15 119 lb 12.8 oz (54.341 kg)  07/09/15 121 lb 12.8 oz (55.248 kg)    Physical Exam  Constitutional: She is oriented to person, place, and time. She appears well-developed and well-nourished. No distress.  HENT:  Head: Normocephalic  and atraumatic.  Right Ear: Hearing, tympanic membrane, external ear and ear canal normal.  Left Ear: Hearing, tympanic membrane, external ear and ear canal normal.  Nose: Nose normal.  Mouth/Throat: Uvula is midline, oropharynx is clear and moist and mucous membranes are normal. No oropharyngeal exudate, posterior oropharyngeal edema or posterior oropharyngeal erythema.  Eyes: Conjunctivae and EOM are normal. Pupils are equal, round, and reactive to light. No scleral  icterus.  Neck: Normal range of motion. Neck supple. Carotid bruit is not present. No thyromegaly present.  Cardiovascular: Normal rate, regular rhythm, normal heart sounds and intact distal pulses.   No murmur heard. Pulses:      Radial pulses are 2+ on the right side, and 2+ on the left side.  Pulmonary/Chest: Effort normal and breath sounds normal. No respiratory distress. She has no wheezes. She has no rales.  Abdominal: Soft. Bowel sounds are normal. She exhibits no distension and no mass. There is no tenderness. There is no rebound and no guarding.  Musculoskeletal: Normal range of motion. She exhibits no edema.  Lymphadenopathy:    She has no cervical adenopathy.  Neurological: She is alert and oriented to person, place, and time.  CN grossly intact, station and gait intact Recall 3/3 Calculation 5/5 serial 3s  Skin: Skin is warm and dry. No rash noted.  Psychiatric: She has a normal mood and affect. Her behavior is normal. Judgment and thought content normal.  Nursing note and vitals reviewed.  Results for orders placed or performed in visit on 09/13/15  Lipid panel  Result Value Ref Range   Cholesterol 223 (H) 0 - 200 mg/dL   Triglycerides 69.0 0.0 - 149.0 mg/dL   HDL 56.20 >39.00 mg/dL   VLDL 13.8 0.0 - 40.0 mg/dL   LDL Cholesterol 153 (H) 0 - 99 mg/dL   Total CHOL/HDL Ratio 4    NonHDL 166.38   TSH  Result Value Ref Range   TSH 3.29 0.35 - 4.50 uIU/mL  Hemoglobin A1c  Result Value Ref Range   Hgb A1c MFr Bld 5.9 4.6 - 6.5 %  Basic metabolic panel  Result Value Ref Range   Sodium 140 135 - 145 mEq/L   Potassium 4.0 3.5 - 5.1 mEq/L   Chloride 104 96 - 112 mEq/L   CO2 28 19 - 32 mEq/L   Glucose, Bld 86 70 - 99 mg/dL   BUN 14 6 - 23 mg/dL   Creatinine, Ser 0.77 0.40 - 1.20 mg/dL   Calcium 9.1 8.4 - 10.5 mg/dL   GFR 79.48 >60.00 mL/min      Assessment & Plan:   Problem List Items Addressed This Visit    Surgical menopause on hormone replacement therapy     Slow taper - down to 0.3mg  1/2 tab daily.      Prediabetes    rec avoid added sugars and sweetened beverages.      Medicare annual wellness visit, initial - Primary    I have personally reviewed the Medicare Annual Wellness questionnaire and have noted 1. The patient's medical and social history 2. Their use of alcohol, tobacco or illicit drugs 3. Their current medications and supplements 4. The patient's functional ability including ADL's, fall risks, home safety risks and hearing or visual impairment. Cognitive function has been assessed and addressed as indicated.  5. Diet and physical activity 6. Evidence for depression or mood disorders The patients weight, height, BMI have been recorded in the chart. I have made referrals, counseling and provided education  to the patient based on review of the above and I have provided the pt with a written personalized care plan for preventive services. Provider list updated.. See scanned questionairre as needed for further documentation. Reviewed preventative protocols and updated unless pt declined.       Loss of weight    Reviewed significant weight loss over last 1-2 yrs. Pt not concerned - attributes to increased activity and stress since retirement. However endorses decreased appetite. No other sxs endorsed. UTD mammogram, s/p complete hysterectomy. I will send her home with iFOB and have her return in 3-4 mo for recheck weight.      Hypertension    Chronic, stable. Continue carvedilol 6.25mg  bid.      HLD (hyperlipidemia)    Reviewed with patient, rec low chol diet. Handout provided.      Depression with anxiety    Doing well with zoloft 25mg  daily + klonopin nightly.      Advanced care planning/counseling discussion    Advanced directive: has at home. Will bring me copy. HCPOA is husband.       Other Visit Diagnoses    Special screening for malignant neoplasms, colon        Relevant Orders    Fecal occult blood,  imunochemical        Follow up plan: Return in about 3 months (around 01/17/2016), or as needed, for follow up visit.

## 2015-10-17 NOTE — Assessment & Plan Note (Signed)
Doing well with zoloft 25mg  daily + klonopin nightly.

## 2015-10-17 NOTE — Assessment & Plan Note (Signed)
Advanced directive: has at home. Will bring me copy. HCPOA is husband. 

## 2015-10-17 NOTE — Assessment & Plan Note (Signed)
Slow taper - down to 0.3mg  1/2 tab daily.

## 2015-10-22 DIAGNOSIS — J3089 Other allergic rhinitis: Secondary | ICD-10-CM | POA: Diagnosis not present

## 2015-10-31 ENCOUNTER — Other Ambulatory Visit: Payer: Medicare Other

## 2015-10-31 DIAGNOSIS — Z1211 Encounter for screening for malignant neoplasm of colon: Secondary | ICD-10-CM

## 2015-10-31 DIAGNOSIS — J3089 Other allergic rhinitis: Secondary | ICD-10-CM | POA: Diagnosis not present

## 2015-10-31 LAB — FECAL OCCULT BLOOD, GUAIAC: Fecal Occult Blood: NEGATIVE

## 2015-10-31 LAB — FECAL OCCULT BLOOD, IMMUNOCHEMICAL: FECAL OCCULT BLD: NEGATIVE

## 2015-11-01 ENCOUNTER — Encounter: Payer: Self-pay | Admitting: *Deleted

## 2015-11-05 DIAGNOSIS — J3089 Other allergic rhinitis: Secondary | ICD-10-CM | POA: Diagnosis not present

## 2015-11-14 ENCOUNTER — Other Ambulatory Visit: Payer: Self-pay | Admitting: Family Medicine

## 2015-11-14 NOTE — Telephone Encounter (Signed)
plz phone in. 

## 2015-11-14 NOTE — Telephone Encounter (Signed)
Rx called in as directed.   

## 2015-11-26 DIAGNOSIS — J3089 Other allergic rhinitis: Secondary | ICD-10-CM | POA: Diagnosis not present

## 2015-12-01 DIAGNOSIS — L111 Transient acantholytic dermatosis [Grover]: Secondary | ICD-10-CM

## 2015-12-01 HISTORY — DX: Transient acantholytic dermatosis (grover): L11.1

## 2015-12-03 ENCOUNTER — Other Ambulatory Visit: Payer: Self-pay | Admitting: Family Medicine

## 2015-12-05 DIAGNOSIS — J3089 Other allergic rhinitis: Secondary | ICD-10-CM | POA: Diagnosis not present

## 2015-12-11 IMAGING — MG MM DIGITAL SCREENING BILAT W/ TOMO W/ CAD
9 of 12 series · 9 of 28 positions shown · non-contrast
Comparison: Previous exam(s).

CLINICAL DATA: Screening.

EXAM:
DIGITAL SCREENING BILATERAL MAMMOGRAM WITH 3D TOMO WITH CAD

[R MLO synth-2D]
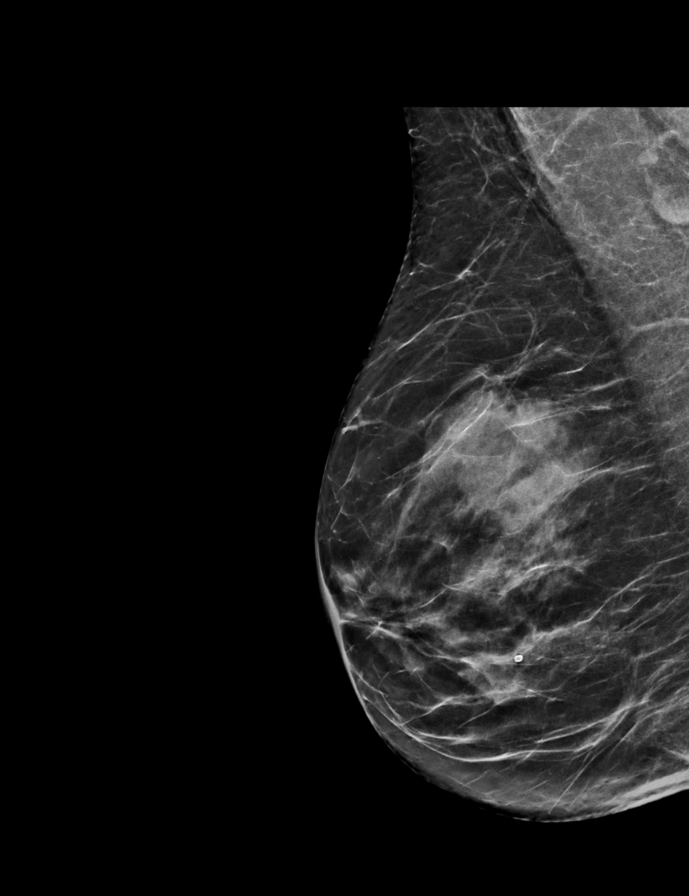

[L MLO]
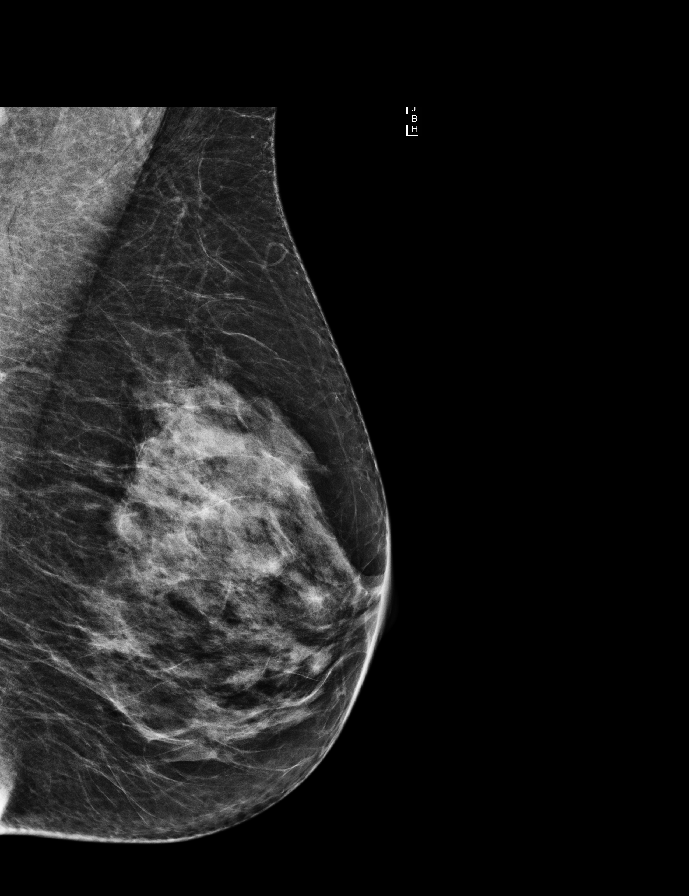

[R CC]
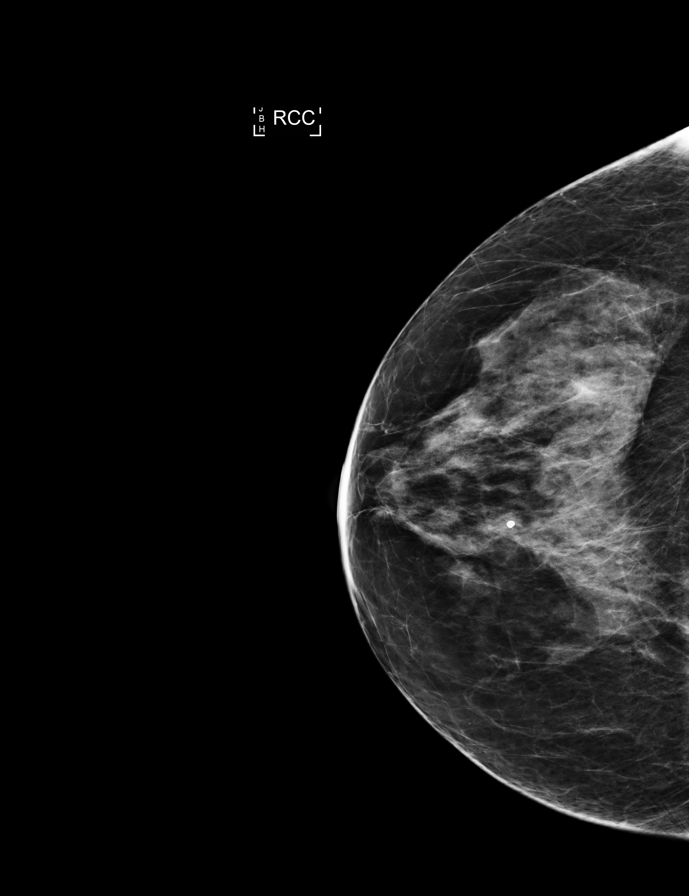

[L CC synth-2D]
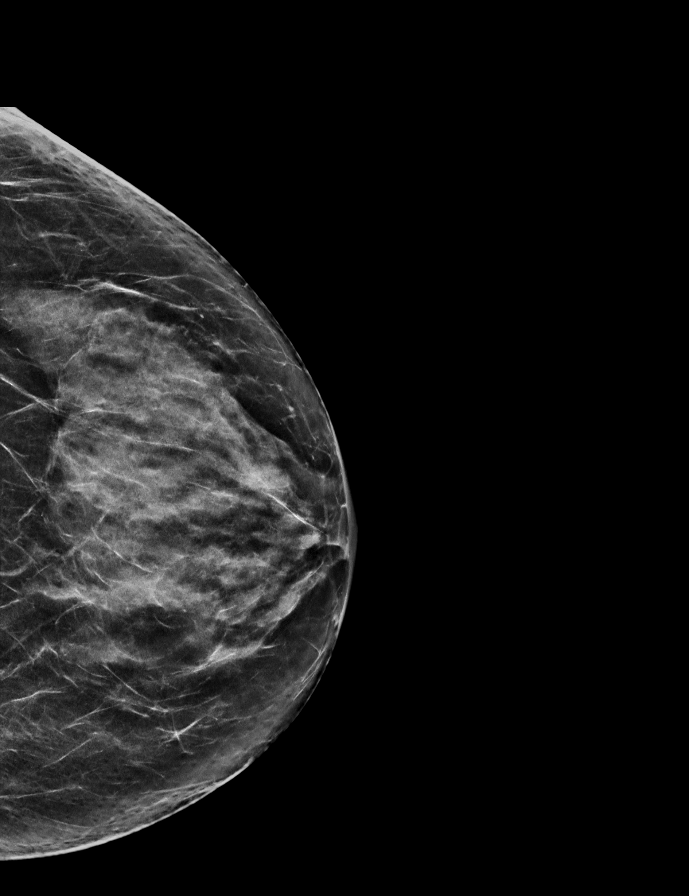

[L MLO synth-2D]
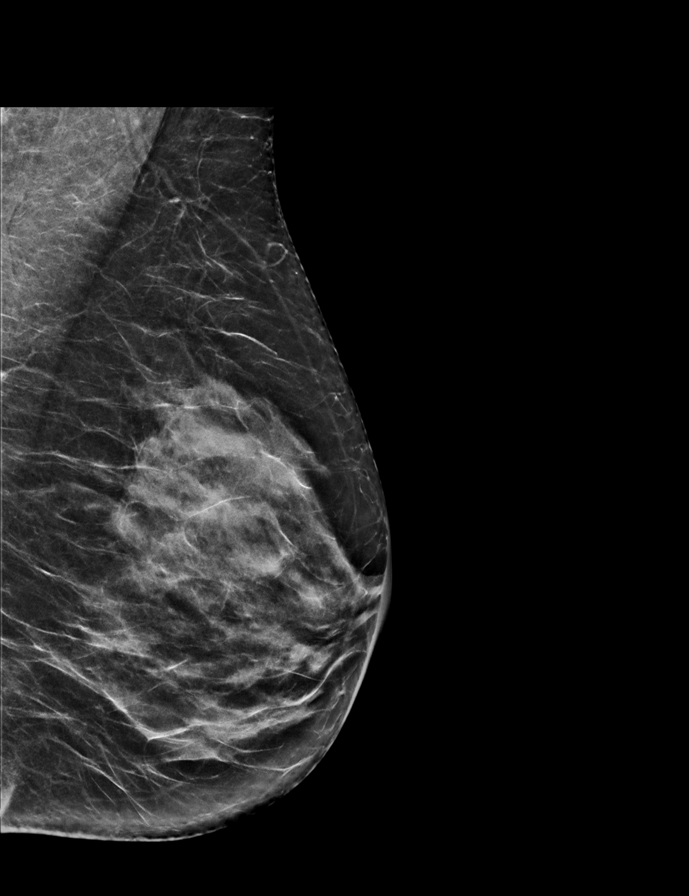

[R MLO]
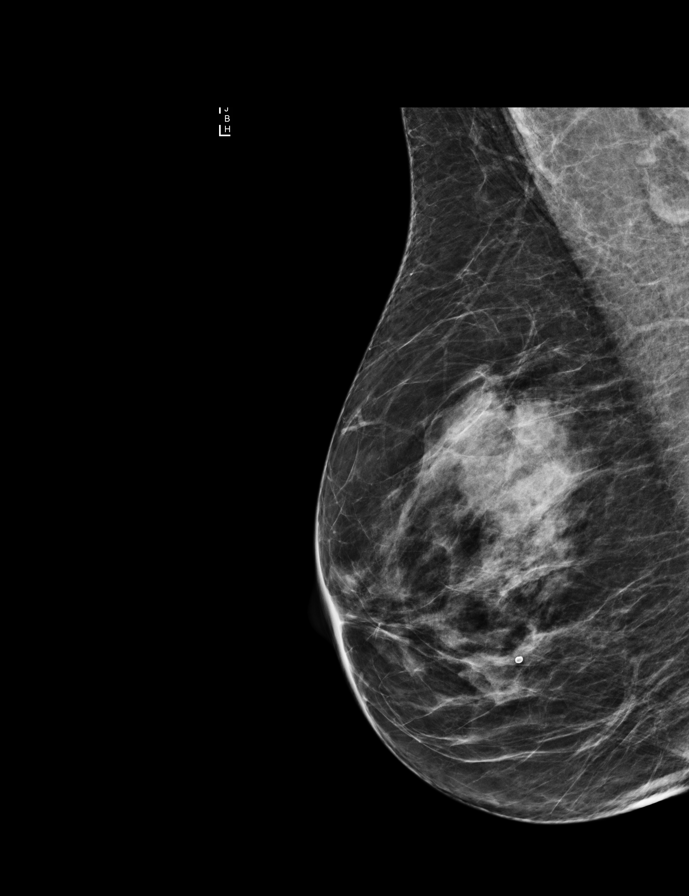

[R CC synth-2D]
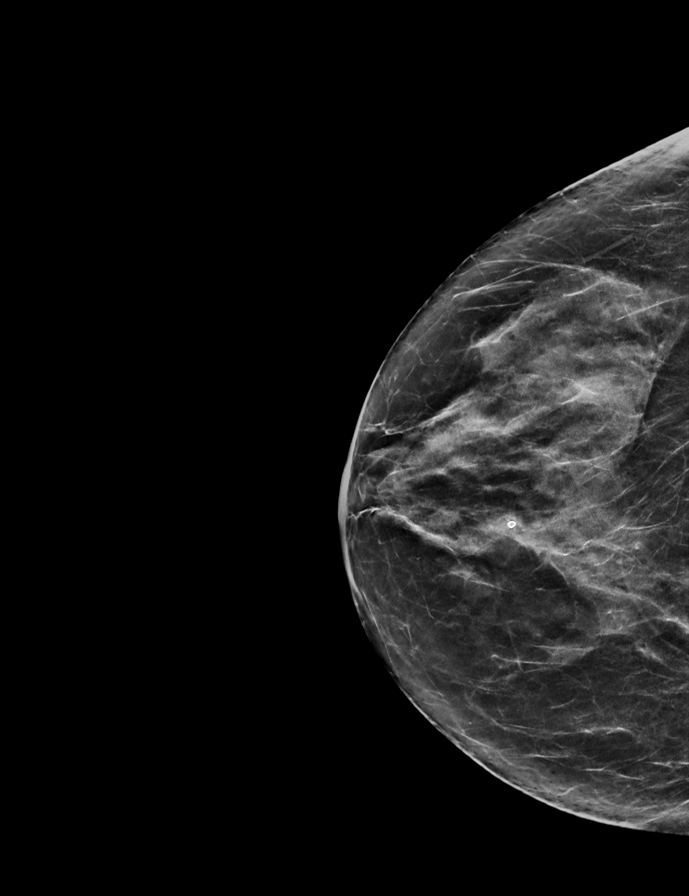

[L CC]
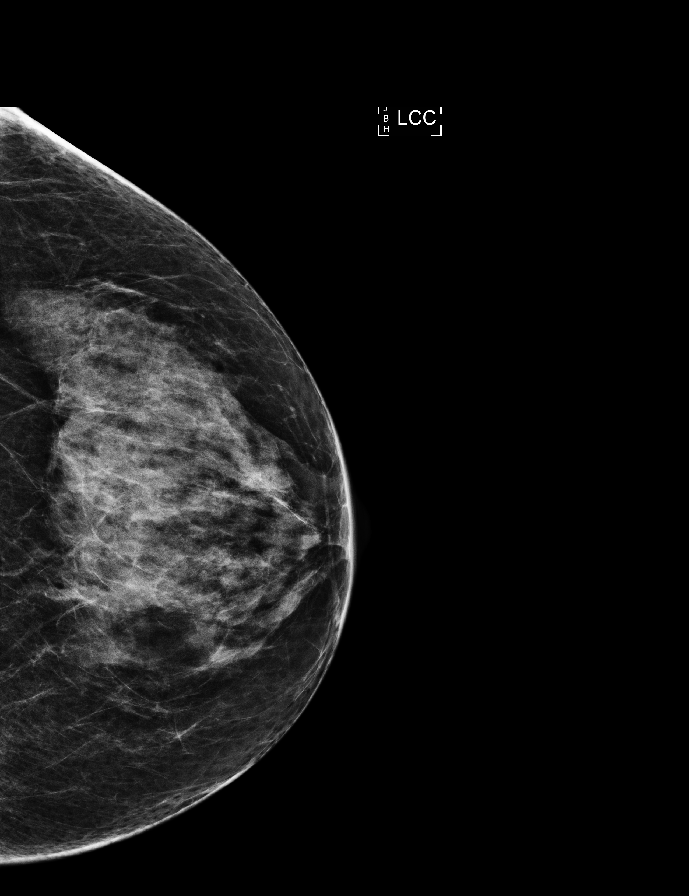

[R CC tomo · tomo slice 29/57.0]
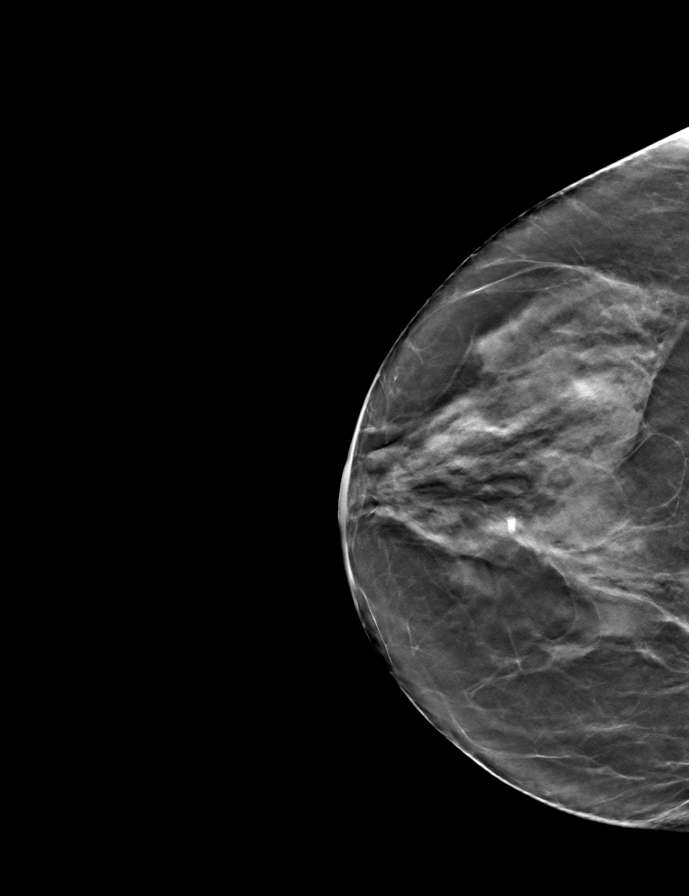

[9 of 28 positions shown; findings below may reference images not displayed]

ACR Breast Density Category c: The breast tissue is heterogeneously
dense, which may obscure small masses.
FINDINGS: There are no findings suspicious for malignancy. Images were
processed with CAD.
IMPRESSION: No mammographic evidence of malignancy. A result letter of this
screening mammogram will be mailed directly to the patient.

RECOMMENDATION:
Screening mammogram in one year. (Code:OA-G-1SS)

BI-RADS CATEGORY  1: Negative.

## 2015-12-12 DIAGNOSIS — J3089 Other allergic rhinitis: Secondary | ICD-10-CM | POA: Diagnosis not present

## 2015-12-13 ENCOUNTER — Other Ambulatory Visit: Payer: Self-pay | Admitting: *Deleted

## 2015-12-13 MED ORDER — CLONAZEPAM 0.5 MG PO TABS: 0.5000 mg | ORAL_TABLET | Freq: Every day | ORAL | Status: DC

## 2015-12-13 NOTE — Telephone Encounter (Signed)
plz phone in. 

## 2015-12-13 NOTE — Telephone Encounter (Signed)
Ok to refill 

## 2015-12-16 NOTE — Telephone Encounter (Signed)
Rx called in as directed.   

## 2015-12-17 DIAGNOSIS — J301 Allergic rhinitis due to pollen: Secondary | ICD-10-CM | POA: Diagnosis not present

## 2015-12-17 DIAGNOSIS — J3089 Other allergic rhinitis: Secondary | ICD-10-CM | POA: Diagnosis not present

## 2015-12-26 DIAGNOSIS — J3089 Other allergic rhinitis: Secondary | ICD-10-CM | POA: Diagnosis not present

## 2016-01-07 DIAGNOSIS — J3089 Other allergic rhinitis: Secondary | ICD-10-CM | POA: Diagnosis not present

## 2016-01-14 DIAGNOSIS — J3089 Other allergic rhinitis: Secondary | ICD-10-CM | POA: Diagnosis not present

## 2016-01-15 ENCOUNTER — Other Ambulatory Visit: Payer: Self-pay | Admitting: Family Medicine

## 2016-01-15 NOTE — Telephone Encounter (Signed)
Ok to refill 

## 2016-01-16 NOTE — Telephone Encounter (Signed)
Rx called in as directed.   

## 2016-01-16 NOTE — Telephone Encounter (Signed)
plz phone in. 

## 2016-01-17 ENCOUNTER — Ambulatory Visit: Payer: PRIVATE HEALTH INSURANCE | Admitting: Family Medicine

## 2016-01-23 DIAGNOSIS — J3089 Other allergic rhinitis: Secondary | ICD-10-CM | POA: Diagnosis not present

## 2016-01-30 ENCOUNTER — Encounter: Payer: Self-pay | Admitting: Family Medicine

## 2016-01-30 ENCOUNTER — Ambulatory Visit (INDEPENDENT_AMBULATORY_CARE_PROVIDER_SITE_OTHER): Payer: Medicare Other | Admitting: Family Medicine

## 2016-01-30 VITALS — BP 140/80 | HR 64 | Temp 97.5°F | Wt 116.5 lb

## 2016-01-30 DIAGNOSIS — F331 Major depressive disorder, recurrent, moderate: Secondary | ICD-10-CM

## 2016-01-30 DIAGNOSIS — R634 Abnormal weight loss: Secondary | ICD-10-CM

## 2016-01-30 DIAGNOSIS — R5383 Other fatigue: Secondary | ICD-10-CM

## 2016-01-30 DIAGNOSIS — E041 Nontoxic single thyroid nodule: Secondary | ICD-10-CM

## 2016-01-30 DIAGNOSIS — R5381 Other malaise: Secondary | ICD-10-CM

## 2016-01-30 DIAGNOSIS — J3089 Other allergic rhinitis: Secondary | ICD-10-CM | POA: Diagnosis not present

## 2016-01-30 DIAGNOSIS — Z7189 Other specified counseling: Secondary | ICD-10-CM

## 2016-01-30 DIAGNOSIS — E8940 Asymptomatic postprocedural ovarian failure: Secondary | ICD-10-CM

## 2016-01-30 DIAGNOSIS — Z79890 Hormone replacement therapy (postmenopausal): Secondary | ICD-10-CM

## 2016-01-30 LAB — CBC WITH DIFFERENTIAL/PLATELET
BASOS PCT: 0.6 % (ref 0.0–3.0)
Basophils Absolute: 0 10*3/uL (ref 0.0–0.1)
EOS PCT: 2.2 % (ref 0.0–5.0)
Eosinophils Absolute: 0.1 10*3/uL (ref 0.0–0.7)
HCT: 39.5 % (ref 36.0–46.0)
Hemoglobin: 13.2 g/dL (ref 12.0–15.0)
LYMPHS ABS: 2.2 10*3/uL (ref 0.7–4.0)
LYMPHS PCT: 36.1 % (ref 12.0–46.0)
MCHC: 33.3 g/dL (ref 30.0–36.0)
MCV: 87.6 fl (ref 78.0–100.0)
MONOS PCT: 8.9 % (ref 3.0–12.0)
Monocytes Absolute: 0.5 10*3/uL (ref 0.1–1.0)
NEUTROS ABS: 3.2 10*3/uL (ref 1.4–7.7)
NEUTROS PCT: 52.2 % (ref 43.0–77.0)
Platelets: 219 10*3/uL (ref 150.0–400.0)
RBC: 4.51 Mil/uL (ref 3.87–5.11)
RDW: 15 % (ref 11.5–15.5)
WBC: 6.2 10*3/uL (ref 4.0–10.5)

## 2016-01-30 LAB — HEPATIC FUNCTION PANEL
ALBUMIN: 4.3 g/dL (ref 3.5–5.2)
ALT: 9 U/L (ref 0–35)
AST: 16 U/L (ref 0–37)
Alkaline Phosphatase: 63 U/L (ref 39–117)
Bilirubin, Direct: 0 mg/dL (ref 0.0–0.3)
Total Bilirubin: 0.3 mg/dL (ref 0.2–1.2)
Total Protein: 7.6 g/dL (ref 6.0–8.3)

## 2016-01-30 LAB — VITAMIN B12: VITAMIN B 12: 363 pg/mL (ref 211–911)

## 2016-01-30 NOTE — Assessment & Plan Note (Signed)
Benign biopsy 08/2014. Consider updated [place] next physical.

## 2016-01-30 NOTE — Progress Notes (Signed)
Pre visit review using our clinic review tool, if applicable. No additional management support is needed unless otherwise documented below in the visit note. 

## 2016-01-30 NOTE — Assessment & Plan Note (Signed)
Advanced directive: living will scanned 01/2015. Working on [employer] form. Would want husband to be HCPOA. Packet provided today.

## 2016-01-30 NOTE — Assessment & Plan Note (Signed)
Continues decreasing estrogen. Down to 1/2 tab premarin 0.3mg  daily. UTD mammogram.

## 2016-01-30 NOTE — Assessment & Plan Note (Addendum)
Weight has stabilized. Pt unconcerned, asxs. Check CBC, LFT and B12 today. Pt aware B12 may not be covered by insurance. If any further weight loss, low threshold for further eval (CXR, UA).

## 2016-01-30 NOTE — Patient Instructions (Addendum)
Sign release for records from Dr Amedeo Plenty on colonoscopy 2009.  Work on health care power of attorney form at home. Packet provided today. Blood work today. Good to see you today, call us with questions. Return in 4 months for follow up.

## 2016-01-30 NOTE — Progress Notes (Signed)
BP 140/80 mmHg  Pulse 64  Temp(Src) 97.5 F (36.4 C) (Oral)  Wt 116 lb 8 oz (52.844 kg)   CC: 3 mo f/u visit  Subjective:    Patient ID: Donna Vargas, female    DOB: 05-28-1946, 70 y.o.   MRN: [id number]  HPI: Donna Vargas is a 70 y.o. female presenting on 01/30/2016 for Follow-up   Weight loss noted over last year. 08/2014 - 131lbs. Peak weight 149lbs 2 yrs ago. Attributes to increased activity (active on farm) and family stress. Endorses decreased appetite. Denies any other symptoms. iFOB normal (10/2015). Has maintained weight over last 3 months. Remains unconcerned. She does endorse some malaise/fatigue in afternoons. Ears feel congested. No dizziness or significant headache. Worsening allergies recently despite allergy shot and routine allergy meds.  Prediabetes - avoids added sugars.  Recently seen by Anda Kraft for worsening anxiety/depression - started on zoloft 25mg  daily and conitnues this. Continues clonazepam nightly for sleep. Doing well now.   Colonoscopy 2009 Amedeo Plenty) normal per patient. No records TOTAL ABDOMINAL HYSTERECTOMY Date: 1995 both ovaries removed. CA125 level elevated - but no cancer Breast cancer screening - normal 1 year ago. mammo 07/2015. Breast exams at home.   Requests ear irrigation today for bilateral impaction - feels ear fullness.  Relevant past medical, surgical, family and social history reviewed and updated as indicated. Interim medical history since our last visit reviewed. Allergies and medications reviewed and updated. Current Outpatient Prescriptions on File Prior to Visit  Medication Sig  . aspirin 81 MG tablet Take 81 mg by mouth daily.  . carvedilol (COREG) 6.25 MG tablet TAKE 1 TABLET BY MOUTH DAILY.  . clonazePAM (KLONOPIN) 0.5 MG tablet TAKE 1 TABLET BY MOUTH AT BEDTIME  . diphenhydrAMINE (BENADRYL) 25 mg capsule Take 25 mg by mouth at bedtime.  Marland Kitchen levocetirizine (XYZAL) 5 MG tablet Take 5 mg by mouth at bedtime.  .  montelukast (SINGULAIR) 10 MG tablet TAKE 1 TABLET BY MOUTH AT BEDTIME.  . naproxen (NAPROSYN) 500 MG tablet Take one po bid x 5 days then prn pain, take with food  . PREMARIN 0.3 MG tablet TAKE 1 TABLET (0.3 MG TOTAL) BY MOUTH DAILY. (Patient taking differently: TAKE 1/2 TABLET (0.15 MG TOTAL) BY MOUTH DAILY.)  . sertraline (ZOLOFT) 50 MG tablet Take 0.5 tablets (25 mg total) by mouth daily.   No current facility-administered medications on file prior to visit.    Review of Systems Per HPI unless specifically indicated in ROS section     Objective:    BP 140/80 mmHg  Pulse 64  Temp(Src) 97.5 F (36.4 C) (Oral)  Wt 116 lb 8 oz (52.844 kg)  Wt Readings from Last 3 Encounters:  01/30/16 116 lb 8 oz (52.844 kg)  10/17/15 116 lb (52.617 kg)  08/12/15 119 lb 12.8 oz (54.341 kg)   Body mass index is 20.64 kg/(m^2).  Physical Exam  Constitutional: She appears well-developed and well-nourished. No distress.  HENT:  Right Ear: Hearing and external ear normal.  Left Ear: Hearing and external ear normal.  Mouth/Throat: Oropharynx is clear and moist. No oropharyngeal exudate.  Bilateral cerumen impaction s/p irrigation in office today  Eyes: Conjunctivae and EOM are normal. Pupils are equal, round, and reactive to light.  Neck: Thyromegaly (R nodule) present.  Cardiovascular: Normal rate, regular rhythm, normal heart sounds and intact distal pulses.   No murmur heard. Pulmonary/Chest: Effort normal and breath sounds normal. No respiratory distress. She has no wheezes. She has no  rales.  Abdominal: Soft. Normal appearance and bowel sounds are normal. She exhibits no distension and no mass. There is no hepatosplenomegaly. There is no tenderness. There is no rigidity, no rebound, no guarding, no CVA tenderness and negative Murphy's sign.  Musculoskeletal: She exhibits no edema.  Skin: Skin is warm and dry. No rash noted.  Psychiatric: She has a normal mood and affect.  Nursing note and  vitals reviewed.  Results for orders placed or performed in visit on 11/01/15  Fecal Occult Blood, Guaiac  Result Value Ref Range   Fecal Occult Blood Negative    Lab Results  Component Value Date   HGBA1C 5.9 09/13/2015       Assessment & Plan:   Problem List Items Addressed This Visit    Surgical menopause on hormone replacement therapy    Continues decreasing estrogen. Down to 1/2 tab premarin 0.3mg  daily. UTD mammogram.      Right thyroid nodule    Benign biopsy 08/2014. Consider updated [place] next physical.      MDD (major depressive disorder), recurrent episode, moderate ([place])    Doing well on zoloft 25mg  daily.      Loss of weight - Primary    Weight has stabilized. Pt unconcerned, asxs. Check CBC, LFT and B12 today. Pt aware B12 may not be covered by insurance. If any further weight loss, low threshold for further eval (CXR, UA).       Relevant Orders   CBC with Differential/Platelet   Hepatic function panel   Vitamin B12   Advanced care planning/counseling discussion    Advanced directive: living will scanned 01/2015. Working on [employer] form. Would want husband to be HCPOA. Packet provided today.       Other Visit Diagnoses    Malaise and fatigue        Relevant Orders    CBC with Differential/Platelet    Hepatic function panel    Vitamin B12        Follow up plan: Return in about 4 months (around 05/31/2016), or as needed, for follow up visit.

## 2016-01-30 NOTE — Assessment & Plan Note (Signed)
Doing well on zoloft 25 mg daily.  

## 2016-02-01 ENCOUNTER — Other Ambulatory Visit: Payer: Self-pay | Admitting: Family Medicine

## 2016-02-01 MED ORDER — CYANOCOBALAMIN 500 MCG PO TABS: 500.0000 ug | ORAL_TABLET | Freq: Every day | ORAL | Status: DC

## 2016-02-03 ENCOUNTER — Encounter: Payer: Self-pay | Admitting: *Deleted

## 2016-02-08 ENCOUNTER — Encounter: Payer: Self-pay | Admitting: Family Medicine

## 2016-02-11 DIAGNOSIS — J3089 Other allergic rhinitis: Secondary | ICD-10-CM | POA: Diagnosis not present

## 2016-02-13 ENCOUNTER — Other Ambulatory Visit: Payer: Self-pay | Admitting: Primary Care

## 2016-02-13 MED ORDER — CLONAZEPAM 0.5 MG PO TABS: 0.5000 mg | ORAL_TABLET | Freq: Every day | ORAL | Status: DC

## 2016-02-13 NOTE — Telephone Encounter (Signed)
Electronically refill request for   Take 1 tablet (50 mg total) by mouth daily.  Dispense: 30 tablet   Refills: 3    Last prescribed on 07/09/2015 by Anda Kraft. Last seen on 01/30/2016. Next follow up on 06/04/2016.

## 2016-02-13 NOTE — Telephone Encounter (Signed)
plz phone in. 

## 2016-02-13 NOTE — Telephone Encounter (Signed)
Refilled sertraline. Ok to refill clonazepam?

## 2016-02-14 NOTE — Telephone Encounter (Signed)
Rx called in as directed.   

## 2016-02-18 DIAGNOSIS — J3089 Other allergic rhinitis: Secondary | ICD-10-CM | POA: Diagnosis not present

## 2016-02-26 ENCOUNTER — Encounter: Payer: Self-pay | Admitting: Family Medicine

## 2016-02-26 ENCOUNTER — Ambulatory Visit (INDEPENDENT_AMBULATORY_CARE_PROVIDER_SITE_OTHER): Payer: Medicare Other | Admitting: Family Medicine

## 2016-02-26 VITALS — BP 126/70 | HR 69 | Temp 98.1°F | Wt 119.0 lb

## 2016-02-26 DIAGNOSIS — R21 Rash and other nonspecific skin eruption: Secondary | ICD-10-CM

## 2016-02-26 MED ORDER — TRIAMCINOLONE ACETONIDE 0.1 % EX OINT: 1.0000 "application " | TOPICAL_OINTMENT | Freq: Two times a day (BID) | CUTANEOUS | Status: DC

## 2016-02-26 NOTE — Patient Instructions (Signed)
Use the ointment twice daily.   Do not use for more than 2 weeks consecutively.  Call if you worsen or fail to improve.  Take care  Dr. Lacinda Axon

## 2016-02-26 NOTE — Progress Notes (Signed)
Pre visit review using our clinic review tool, if applicable. No additional management support is needed unless otherwise documented below in the visit note. 

## 2016-02-26 NOTE — Progress Notes (Signed)
   Subjective:  Patient ID: Donna Vargas, female    DOB: 02-06-1946  Age: 70 y.o. MRN: [id number]  CC: Rash  HPI:  70 year old female presents with complaints of rash.  Rash  Has been present x 1 week.  Located on the back.  No known inciting factors.  She reports mild itching.   No new exposures/changes.  She has been taking her home allergy medications with no improvement.  No known exacerbating factors.  No other associated symptoms.  Social Hx   Social History   Social History  . Marital Status: Married    Spouse Name: N/A  . Number of Children: N/A  . Years of Education: N/A   Social History Main Topics  . Smoking status: Never Smoker   . Smokeless tobacco: Never Used  . Alcohol Use: No  . Drug Use: No  . Sexual Activity: Not Asked   Other Topics Concern  . 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  Skin: Positive for rash.   Objective:  BP 126/70 mmHg  Pulse 69  Temp(Src) 98.1 F (36.7 C) (Oral)  Wt 119 lb (53.978 kg)  SpO2 98%  BP/Weight 02/26/2016 01/30/2016 123456  Systolic BP 123XX123 [id number] [id number]  Diastolic BP 70 80 76  Wt. (Lbs) 119 116.5 116  BMI 21.09 20.64 20.55   Physical Exam  Constitutional: She appears well-developed.  HENT:  Head: Atraumatic.  Mouth/Throat: Oropharynx is clear and moist.  Normal TM's bilaterally.   Cardiovascular: Normal rate and regular rhythm.   Pulmonary/Chest: Effort normal and breath sounds normal. She has no wheezes. She has no rales.  Skin:  Erythematous papular rash noted diffusely on the back.  Vitals reviewed.   Lab Results  Component Value Date   WBC 6.2 01/30/2016   HGB 13.2 01/30/2016   HCT 39.5 01/30/2016   PLT  219.0 01/30/2016   GLUCOSE 86 09/13/2015   CHOL 223* 09/13/2015   TRIG 69.0 09/13/2015   HDL 56.20 09/13/2015   LDLCALC 153* 09/13/2015   ALT 9 01/30/2016   AST 16 01/30/2016   NA 140 09/13/2015   K 4.0 09/13/2015   CL 104 09/13/2015   CREATININE 0.77 09/13/2015   BUN 14 09/13/2015   CO2 28 09/13/2015   TSH 3.29 09/13/2015   HGBA1C 5.9 09/13/2015    Assessment & Plan:   Problem List Items Addressed This Visit    Rash - Primary    New problem. Appears contact/allergic. Discussed treatment options - topical vs systemic steroids. Patient elected for topical steroid. Treating with topical triamcinolone.          Meds ordered this encounter  Medications  . triamcinolone ointment (KENALOG) 0.1 %    Sig: Apply 1 application topically 2 (two) times daily.    Dispense:  30 g    Refill:  0    Follow-up: PRN  [place]

## 2016-02-27 NOTE — Assessment & Plan Note (Signed)
New problem. Appears contact/allergic. Discussed treatment options - topical vs systemic steroids. Patient elected for topical steroid. Treating with topical triamcinolone.

## 2016-03-02 DIAGNOSIS — L111 Transient acantholytic dermatosis [Grover]: Secondary | ICD-10-CM | POA: Diagnosis not present

## 2016-03-04 ENCOUNTER — Encounter: Payer: Self-pay | Admitting: Internal Medicine

## 2016-03-04 ENCOUNTER — Ambulatory Visit (INDEPENDENT_AMBULATORY_CARE_PROVIDER_SITE_OTHER): Payer: Medicare Other | Admitting: Internal Medicine

## 2016-03-04 VITALS — BP 120/82 | HR 68 | Temp 98.1°F | Resp 14 | Wt 116.0 lb

## 2016-03-04 DIAGNOSIS — J0110 Acute frontal sinusitis, unspecified: Secondary | ICD-10-CM | POA: Diagnosis not present

## 2016-03-04 DIAGNOSIS — R634 Abnormal weight loss: Secondary | ICD-10-CM

## 2016-03-04 MED ORDER — AMOXICILLIN 500 MG PO TABS: 1000.0000 mg | ORAL_TABLET | Freq: Two times a day (BID) | ORAL | Status: DC

## 2016-03-04 NOTE — Progress Notes (Signed)
Pre visit review using our clinic review tool, if applicable. No additional management support is needed unless otherwise documented below in the visit note. 

## 2016-03-04 NOTE — Progress Notes (Signed)
Subjective:    Patient ID: Donna Vargas, female    DOB: 1946/06/03, 70 y.o.   MRN: [id number]  HPI Here due to respiratory illness  "I am tired of feeling bad" Sick since about 10 days ago Actually had to stay in bed for a few days last week Starts okay in AM--then progressive fatigue Did have fever--but this is gone Dull frontal headache Some cough--mucus but can't cough out. Does throw up after coughing at times No post nasal drip Some head congestion but feels it more in throat (but not sore there) No SOB Some chills and then sweaty---but no clear fever  Tried her allergy pills---not helping Has tried nyquil and dayquil--may have helped some  Current Outpatient Prescriptions on File Prior to Visit  Medication Sig Dispense Refill  . aspirin 81 MG tablet Take 81 mg by mouth daily.    . carvedilol (COREG) 6.25 MG tablet TAKE 1 TABLET BY MOUTH DAILY. 30 tablet 11  . clonazePAM (KLONOPIN) 0.5 MG tablet Take 1 tablet (0.5 mg total) by mouth at bedtime. 30 tablet 0  . cyanocobalamin (V-R VITAMIN B-12) 500 MCG tablet Take 1 tablet (500 mcg total) by mouth daily.    . diphenhydrAMINE (BENADRYL) 25 mg capsule Take 25 mg by mouth at bedtime.    Marland Kitchen levocetirizine (XYZAL) 5 MG tablet Take 5 mg by mouth at bedtime.    . montelukast (SINGULAIR) 10 MG tablet TAKE 1 TABLET BY MOUTH AT BEDTIME. 30 tablet 11  . naproxen (NAPROSYN) 500 MG tablet Take one po bid x 5 days then prn pain, take with food 40 tablet 0  . PREMARIN 0.3 MG tablet TAKE 1 TABLET (0.3 MG TOTAL) BY MOUTH DAILY. (Patient taking differently: TAKE 1/2 TABLET (0.15 MG TOTAL) BY MOUTH DAILY.) 30 tablet 6  . sertraline (ZOLOFT) 50 MG tablet TAKE 1 TABLET BY MOUTH DAILY. (Patient taking differently: TAKE 0.5 to 1 TABLET BY MOUTH DAILY.) 30 tablet 3  . triamcinolone ointment (KENALOG) 0.1 % Apply 1 application topically 2 (two) times daily. 30 g 0   No current facility-administered medications on file prior to visit.     Allergies  Allergen Reactions  . Hctz [Hydrochlorothiazide]     Blood came to the surface of her legs  . Sulfa Antibiotics Rash    Past Medical History  Diagnosis Date  . Perennial allergic rhinitis     allergy shots (sharma)  . History of asthma 1990s    with bronchitis  . Depression with anxiety   . Arthritis     fingers  . History of diverticulitis     ?IBS  . Hypertension   . Hypothyroidism 2015    ?by prior PCP  . Right thyroid nodule 09/11/2014    S/p biopsy showing benign colloid nodule (08/2014)   . Prediabetes 06/2014  . 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 Oophorectomy  1990s    growth on ovary - benign  . Total abdominal hysterectomy  1995    both ovaries removed. CA125 level elevated - but no cancer  . Colonoscopy  10/2008    WNL rpt 10 yrs Amedeo Plenty)  . Cataract extraction Left 03/2015    [place]  . Esophagogastroduodenoscopy  10/2008    gastritis, CLO neg Amedeo Plenty)    Family History  Problem Relation Age of Onset  . Cancer Mother     ovarian x2  . Ovarian cancer  Mother   . CAD Father     unclear  . Cancer Maternal Grandmother     breast  . Breast cancer Maternal Grandmother   . CAD Maternal Grandfather   . Hypertension Neg Hx   . Diabetes Neg Hx   . Breast cancer Cousin     Social History   Social History  . Marital Status: Married    Spouse Name: N/A  . Number of Children: N/A  . Years of Education: N/A   Occupational History  . Not on file.   Social History Main Topics  . Smoking status: Never Smoker   . Smokeless tobacco: Never Used  . Alcohol Use: No  . Drug Use: No  . Sexual Activity: Not on file   Other Topics Concern  . 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 Only vomits after coughing Not eating much    Objective:   Physical Exam  Constitutional: She appears well-developed and well-nourished. No distress.  HENT:  No sinus tenderness Moderate nasal inflammation Slight pharyngeal injection without exudate TMs normal  Neck: Normal range of motion. Neck supple.  Pulmonary/Chest: Effort normal and breath sounds normal. No respiratory distress. She has no wheezes. She has no rales.  Lymphadenopathy:    She has no cervical adenopathy.          Assessment & Plan:

## 2016-03-04 NOTE — Assessment & Plan Note (Signed)
On going allergy symptoms but now with prolonged illness Feels worse than in last few days now Will try empiric antibiotic Continue supportive regimen

## 2016-03-05 DIAGNOSIS — J3089 Other allergic rhinitis: Secondary | ICD-10-CM | POA: Diagnosis not present

## 2016-03-05 DIAGNOSIS — R21 Rash and other nonspecific skin eruption: Secondary | ICD-10-CM | POA: Diagnosis not present

## 2016-03-10 ENCOUNTER — Encounter: Payer: Self-pay | Admitting: Family Medicine

## 2016-03-12 DIAGNOSIS — J3089 Other allergic rhinitis: Secondary | ICD-10-CM | POA: Diagnosis not present

## 2016-03-16 ENCOUNTER — Other Ambulatory Visit: Payer: Self-pay

## 2016-03-16 MED ORDER — CLONAZEPAM 0.5 MG PO TABS: 0.5000 mg | ORAL_TABLET | Freq: Every day | ORAL | Status: DC

## 2016-03-16 NOTE — Telephone Encounter (Signed)
Last filled 02-14-16 #30 Last OV 01-30-16 No future visit scheduled

## 2016-03-16 NOTE — Telephone Encounter (Signed)
Refill given verbally to pharmacist

## 2016-03-16 NOTE — Telephone Encounter (Signed)
plz phone in. 

## 2016-03-17 DIAGNOSIS — J3089 Other allergic rhinitis: Secondary | ICD-10-CM | POA: Diagnosis not present

## 2016-03-24 DIAGNOSIS — J3089 Other allergic rhinitis: Secondary | ICD-10-CM | POA: Diagnosis not present

## 2016-03-31 DIAGNOSIS — J3089 Other allergic rhinitis: Secondary | ICD-10-CM | POA: Diagnosis not present

## 2016-04-07 DIAGNOSIS — J3089 Other allergic rhinitis: Secondary | ICD-10-CM | POA: Diagnosis not present

## 2016-04-10 ENCOUNTER — Other Ambulatory Visit: Payer: Self-pay | Admitting: *Deleted

## 2016-04-10 NOTE — Telephone Encounter (Signed)
Ok to refill 

## 2016-04-12 MED ORDER — CLONAZEPAM 0.5 MG PO TABS: 0.5000 mg | ORAL_TABLET | Freq: Every day | ORAL | Status: DC

## 2016-04-12 NOTE — Telephone Encounter (Signed)
plz phone in. 

## 2016-04-13 NOTE — Telephone Encounter (Signed)
Rx called in as directed.   

## 2016-04-14 DIAGNOSIS — J3089 Other allergic rhinitis: Secondary | ICD-10-CM | POA: Diagnosis not present

## 2016-04-21 DIAGNOSIS — J3089 Other allergic rhinitis: Secondary | ICD-10-CM | POA: Diagnosis not present

## 2016-04-28 DIAGNOSIS — J3089 Other allergic rhinitis: Secondary | ICD-10-CM | POA: Diagnosis not present

## 2016-05-05 DIAGNOSIS — J3089 Other allergic rhinitis: Secondary | ICD-10-CM | POA: Diagnosis not present

## 2016-05-12 DIAGNOSIS — J3089 Other allergic rhinitis: Secondary | ICD-10-CM | POA: Diagnosis not present

## 2016-05-14 ENCOUNTER — Other Ambulatory Visit: Payer: Self-pay | Admitting: *Deleted

## 2016-05-14 MED ORDER — CLONAZEPAM 0.5 MG PO TABS: 0.5000 mg | ORAL_TABLET | Freq: Every day | ORAL | Status: DC

## 2016-05-14 NOTE — Telephone Encounter (Signed)
plz phone in. 

## 2016-05-14 NOTE — Telephone Encounter (Signed)
Ok to refill 

## 2016-05-15 NOTE — Telephone Encounter (Signed)
Rx called in as directed.   

## 2016-05-19 DIAGNOSIS — J3089 Other allergic rhinitis: Secondary | ICD-10-CM | POA: Diagnosis not present

## 2016-05-26 DIAGNOSIS — J3089 Other allergic rhinitis: Secondary | ICD-10-CM | POA: Diagnosis not present

## 2016-05-28 DIAGNOSIS — J3089 Other allergic rhinitis: Secondary | ICD-10-CM | POA: Diagnosis not present

## 2016-05-30 DIAGNOSIS — R892 Abnormal level of other drugs, medicaments and biological substances in specimens from other organs, systems and tissues: Secondary | ICD-10-CM | POA: Insufficient documentation

## 2016-05-30 HISTORY — DX: Abnormal level of other drugs, medicaments and biological substances in specimens from other organs, systems and tissues: R89.2

## 2016-06-01 ENCOUNTER — Ambulatory Visit: Payer: PRIVATE HEALTH INSURANCE | Admitting: Family Medicine

## 2016-06-04 ENCOUNTER — Ambulatory Visit: Payer: PRIVATE HEALTH INSURANCE | Admitting: Family Medicine

## 2016-06-08 ENCOUNTER — Encounter: Payer: Self-pay | Admitting: *Deleted

## 2016-06-08 ENCOUNTER — Encounter: Payer: Self-pay | Admitting: Family Medicine

## 2016-06-08 ENCOUNTER — Ambulatory Visit (INDEPENDENT_AMBULATORY_CARE_PROVIDER_SITE_OTHER): Payer: Medicare Other | Admitting: Family Medicine

## 2016-06-08 VITALS — BP 138/80 | HR 66 | Temp 98.1°F | Ht 63.0 in | Wt 120.5 lb

## 2016-06-08 DIAGNOSIS — F331 Major depressive disorder, recurrent, moderate: Secondary | ICD-10-CM

## 2016-06-08 DIAGNOSIS — I10 Essential (primary) hypertension: Secondary | ICD-10-CM

## 2016-06-08 DIAGNOSIS — R634 Abnormal weight loss: Secondary | ICD-10-CM

## 2016-06-08 DIAGNOSIS — Z79899 Other long term (current) drug therapy: Secondary | ICD-10-CM | POA: Diagnosis not present

## 2016-06-08 DIAGNOSIS — Z79890 Hormone replacement therapy (postmenopausal): Secondary | ICD-10-CM

## 2016-06-08 DIAGNOSIS — E8940 Asymptomatic postprocedural ovarian failure: Secondary | ICD-10-CM | POA: Diagnosis not present

## 2016-06-08 MED ORDER — CLONAZEPAM 0.5 MG PO TABS: 0.5000 mg | ORAL_TABLET | Freq: Every day | ORAL | Status: DC

## 2016-06-08 MED ORDER — SERTRALINE HCL 25 MG PO TABS: 25.0000 mg | ORAL_TABLET | Freq: Every day | ORAL | Status: DC

## 2016-06-08 MED ORDER — CARVEDILOL 6.25 MG PO TABS: 6.2500 mg | ORAL_TABLET | Freq: Every day | ORAL | Status: DC

## 2016-06-08 NOTE — Patient Instructions (Addendum)
Update urine screen today.  You are doing well today. Return as needed or in 8 months for wellness visit

## 2016-06-08 NOTE — Assessment & Plan Note (Addendum)
S/p hysterectomy. Pt will decrease premarin to 0.15mg  tablet QOD. Continues taper.

## 2016-06-08 NOTE — Assessment & Plan Note (Signed)
Weight has stabilized 

## 2016-06-08 NOTE — Assessment & Plan Note (Signed)
Chronic, stable. Continue carvedilol 6.25mg once daily.  

## 2016-06-08 NOTE — Assessment & Plan Note (Signed)
Chronic, stable on zoloft despite ongoing family stressors. Not interested in higher dose at this time. On nightly clonazepam prn sleep/anxiety, discussed benzo use. Update UDS. Suggested try 1/2 tab at a time to see if still sleeps well on lower dose.

## 2016-06-08 NOTE — Progress Notes (Signed)
BP 138/80 mmHg  Pulse 66  Temp(Src) 98.1 F (36.7 C) (Oral)  Ht 5\' 3"  (1.6 m)  Wt 120 lb 8 oz (54.658 kg)  BMI 21.35 kg/m2   CC: 4 mo f/u visit  Subjective:    Patient ID: Donna Vargas, female    DOB: [birth year], 70 y.o.   MRN: [id number]  HPI: Donna Vargas is a 70 y.o. female presenting on 06/08/2016 for Follow-up   Depression/anxiety - on zoloft 25mg  daily, clonazepam nightly for sleep. Takes this regularly. Ongoing family stressors (alcoholic brother, sick mother, niece trouble).   Requests coreg refill   Weight gain noted despite increased activity over summer (yardwork)  Relevant past medical, surgical, family and social history reviewed and updated as indicated. Interim medical history since our last visit reviewed. Allergies and medications reviewed and updated. Current Outpatient Prescriptions on File Prior to Visit  Medication Sig  . aspirin 81 MG tablet Take 81 mg by mouth daily.  . cyanocobalamin (V-R VITAMIN B-12) 500 MCG tablet Take 1 tablet (500 mcg total) by mouth daily.  . diphenhydrAMINE (BENADRYL) 25 mg capsule Take 25 mg by mouth at bedtime.  Marland Kitchen levocetirizine (XYZAL) 5 MG tablet Take 5 mg by mouth at bedtime.  . montelukast (SINGULAIR) 10 MG tablet TAKE 1 TABLET BY MOUTH AT BEDTIME.   No current facility-administered medications on file prior to visit.    Review of Systems Per HPI unless specifically indicated in ROS section     Objective:    BP 138/80 mmHg  Pulse 66  Temp(Src) 98.1 F (36.7 C) (Oral)  Ht 5\' 3"  (1.6 m)  Wt 120 lb 8 oz (54.658 kg)  BMI 21.35 kg/m2  Wt Readings from Last 3 Encounters:  06/08/16 120 lb 8 oz (54.658 kg)  03/04/16 116 lb (52.617 kg)  02/26/16 119 lb (53.978 kg)    Physical Exam  Constitutional: She appears well-developed and well-nourished. No distress.  HENT:  Mouth/Throat: Oropharynx is clear and moist. No oropharyngeal exudate.  Cardiovascular: Normal rate, regular rhythm, normal heart  sounds and intact distal pulses.   No murmur heard. Pulmonary/Chest: Effort normal and breath sounds normal. No respiratory distress. She has no wheezes. She has no rales.  Musculoskeletal: She exhibits no edema.  Skin: Skin is warm and dry. No rash noted.  Psychiatric: She has a normal mood and affect.  Nursing note and vitals reviewed.  Results for orders placed or performed in visit on 02/08/16  HM COLONOSCOPY  Result Value Ref Range   HM Colonoscopy normal Amedeo Plenty)       Assessment & Plan:   Problem List Items Addressed This Visit    MDD (major depressive disorder), recurrent episode, moderate ([place]) - Primary    Chronic, stable on zoloft despite ongoing family stressors. Not interested in higher dose at this time. On nightly clonazepam prn sleep/anxiety, discussed benzo use. Update UDS. Suggested try 1/2 tab at a time to see if still sleeps well on lower dose.       Relevant Medications   sertraline (ZOLOFT) 25 MG tablet   Hypertension    Chronic, stable. Continue carvedilol 6.25mg  once daily.       Relevant Medications   carvedilol (COREG) 6.25 MG tablet   Surgical menopause on hormone replacement therapy    S/p hysterectomy. Pt will decrease premarin to 0.15mg  tablet QOD. Continues taper.       Loss of weight    Weight has stabilized.  Follow up plan: Return in about 8 months (around 02/06/2017) for medicare wellness visit.  [name], MD

## 2016-06-08 NOTE — Progress Notes (Signed)
Pre visit review using our clinic review tool, if applicable. No additional management support is needed unless otherwise documented below in the visit note. 

## 2016-06-09 DIAGNOSIS — J3089 Other allergic rhinitis: Secondary | ICD-10-CM | POA: Diagnosis not present

## 2016-06-14 ENCOUNTER — Encounter: Payer: Self-pay | Admitting: Family Medicine

## 2016-06-16 DIAGNOSIS — J3089 Other allergic rhinitis: Secondary | ICD-10-CM | POA: Diagnosis not present

## 2016-06-23 ENCOUNTER — Encounter: Payer: Self-pay | Admitting: Family Medicine

## 2016-06-23 DIAGNOSIS — J3089 Other allergic rhinitis: Secondary | ICD-10-CM | POA: Diagnosis not present

## 2016-06-30 DIAGNOSIS — J3089 Other allergic rhinitis: Secondary | ICD-10-CM | POA: Diagnosis not present

## 2016-07-07 DIAGNOSIS — J3089 Other allergic rhinitis: Secondary | ICD-10-CM | POA: Diagnosis not present

## 2016-07-14 DIAGNOSIS — J3089 Other allergic rhinitis: Secondary | ICD-10-CM | POA: Diagnosis not present

## 2016-07-15 ENCOUNTER — Other Ambulatory Visit: Payer: Self-pay | Admitting: Family Medicine

## 2016-07-15 NOTE — Telephone Encounter (Signed)
Rx called in as directed.   

## 2016-07-15 NOTE — Telephone Encounter (Signed)
Ok to refill? Last filled 06/08/16 #30 0RF

## 2016-07-15 NOTE — Telephone Encounter (Signed)
plz phone in. 

## 2016-07-23 DIAGNOSIS — J3089 Other allergic rhinitis: Secondary | ICD-10-CM | POA: Diagnosis not present

## 2016-07-28 DIAGNOSIS — J3089 Other allergic rhinitis: Secondary | ICD-10-CM | POA: Diagnosis not present

## 2016-08-04 DIAGNOSIS — J3089 Other allergic rhinitis: Secondary | ICD-10-CM | POA: Diagnosis not present

## 2016-08-11 DIAGNOSIS — J3089 Other allergic rhinitis: Secondary | ICD-10-CM | POA: Diagnosis not present

## 2016-08-17 ENCOUNTER — Other Ambulatory Visit: Payer: Self-pay | Admitting: Family Medicine

## 2016-08-17 DIAGNOSIS — Z1231 Encounter for screening mammogram for malignant neoplasm of breast: Secondary | ICD-10-CM

## 2016-08-20 ENCOUNTER — Ambulatory Visit
Admission: RE | Admit: 2016-08-20 | Discharge: 2016-08-20 | Disposition: A | Discharge status: Home / Self Care | Payer: Medicare Other | Source: Ambulatory Visit | Attending: Family Medicine | Admitting: Family Medicine

## 2016-08-20 DIAGNOSIS — Z1231 Encounter for screening mammogram for malignant neoplasm of breast: Secondary | ICD-10-CM | POA: Diagnosis not present

## 2016-08-20 DIAGNOSIS — J3089 Other allergic rhinitis: Secondary | ICD-10-CM | POA: Diagnosis not present

## 2016-08-20 LAB — HM MAMMOGRAPHY

## 2016-08-21 ENCOUNTER — Encounter: Payer: Self-pay | Admitting: *Deleted

## 2016-08-27 DIAGNOSIS — J3089 Other allergic rhinitis: Secondary | ICD-10-CM | POA: Diagnosis not present

## 2016-08-31 ENCOUNTER — Other Ambulatory Visit: Payer: Self-pay | Admitting: Family Medicine

## 2016-08-31 NOTE — Telephone Encounter (Signed)
Ok to refill? Last filled 07/15/16 #30 0RF 

## 2016-09-01 NOTE — Telephone Encounter (Signed)
plz phone in. 

## 2016-09-01 NOTE — Telephone Encounter (Signed)
Rx called in as directed.   

## 2016-09-02 ENCOUNTER — Other Ambulatory Visit: Payer: Self-pay | Admitting: Family Medicine

## 2016-09-08 DIAGNOSIS — J3089 Other allergic rhinitis: Secondary | ICD-10-CM | POA: Diagnosis not present

## 2016-09-15 DIAGNOSIS — J3089 Other allergic rhinitis: Secondary | ICD-10-CM | POA: Diagnosis not present

## 2016-09-17 ENCOUNTER — Ambulatory Visit: Payer: PRIVATE HEALTH INSURANCE | Admitting: Psychology

## 2016-09-22 DIAGNOSIS — J3089 Other allergic rhinitis: Secondary | ICD-10-CM | POA: Diagnosis not present

## 2016-10-02 DIAGNOSIS — J3089 Other allergic rhinitis: Secondary | ICD-10-CM | POA: Diagnosis not present

## 2016-10-06 ENCOUNTER — Other Ambulatory Visit: Payer: Self-pay | Admitting: *Deleted

## 2016-10-06 MED ORDER — CLONAZEPAM 0.5 MG PO TABS: 0.5000 mg | ORAL_TABLET | Freq: Every day | ORAL | 0 refills | Status: DC

## 2016-10-06 NOTE — Telephone Encounter (Signed)
plz phone in. 

## 2016-10-06 NOTE — Telephone Encounter (Signed)
Rx called in as directed.   

## 2016-10-06 NOTE — Telephone Encounter (Signed)
Ok to refill? Last filled 09/01/16 #30 0RF 

## 2016-10-08 DIAGNOSIS — J3089 Other allergic rhinitis: Secondary | ICD-10-CM | POA: Diagnosis not present

## 2016-10-12 DIAGNOSIS — J3089 Other allergic rhinitis: Secondary | ICD-10-CM | POA: Diagnosis not present

## 2016-10-20 DIAGNOSIS — J3089 Other allergic rhinitis: Secondary | ICD-10-CM | POA: Diagnosis not present

## 2016-10-29 DIAGNOSIS — J3089 Other allergic rhinitis: Secondary | ICD-10-CM | POA: Diagnosis not present

## 2016-11-05 ENCOUNTER — Other Ambulatory Visit: Payer: Self-pay

## 2016-11-05 DIAGNOSIS — J3089 Other allergic rhinitis: Secondary | ICD-10-CM | POA: Diagnosis not present

## 2016-11-17 DIAGNOSIS — J3089 Other allergic rhinitis: Secondary | ICD-10-CM | POA: Diagnosis not present

## 2016-11-20 DIAGNOSIS — J3089 Other allergic rhinitis: Secondary | ICD-10-CM | POA: Diagnosis not present

## 2016-11-24 ENCOUNTER — Other Ambulatory Visit: Payer: Self-pay

## 2016-11-24 NOTE — Telephone Encounter (Signed)
Last filled 10-06-16 #30 Last OV 06-08-16 Next OV with Katha Cabal 02-08-17 With Dr Darnell Level 02-15-17

## 2016-11-25 MED ORDER — CLONAZEPAM 0.5 MG PO TABS: 0.5000 mg | ORAL_TABLET | Freq: Every day | ORAL | 0 refills | Status: DC

## 2016-11-25 NOTE — Telephone Encounter (Signed)
plz phone in. 

## 2016-11-25 NOTE — Telephone Encounter (Signed)
Rx called in as directed.   

## 2016-11-26 DIAGNOSIS — J3089 Other allergic rhinitis: Secondary | ICD-10-CM | POA: Diagnosis not present

## 2016-12-01 DIAGNOSIS — J3089 Other allergic rhinitis: Secondary | ICD-10-CM | POA: Diagnosis not present

## 2016-12-08 DIAGNOSIS — J3089 Other allergic rhinitis: Secondary | ICD-10-CM | POA: Diagnosis not present

## 2016-12-15 DIAGNOSIS — J3089 Other allergic rhinitis: Secondary | ICD-10-CM | POA: Diagnosis not present

## 2016-12-24 ENCOUNTER — Other Ambulatory Visit: Payer: Self-pay | Admitting: *Deleted

## 2016-12-24 DIAGNOSIS — J3089 Other allergic rhinitis: Secondary | ICD-10-CM | POA: Diagnosis not present

## 2016-12-24 NOTE — Telephone Encounter (Signed)
Ok to refill? Last filled 11/25/16 #30 0RF

## 2016-12-25 MED ORDER — CLONAZEPAM 0.5 MG PO TABS: 0.5000 mg | ORAL_TABLET | Freq: Every day | ORAL | 0 refills | Status: DC

## 2016-12-25 NOTE — Telephone Encounter (Signed)
plz phone in. 

## 2016-12-28 NOTE — Telephone Encounter (Signed)
Rx called in as directed.   

## 2016-12-29 DIAGNOSIS — J3089 Other allergic rhinitis: Secondary | ICD-10-CM | POA: Diagnosis not present

## 2017-01-05 DIAGNOSIS — J3089 Other allergic rhinitis: Secondary | ICD-10-CM | POA: Diagnosis not present

## 2017-01-12 DIAGNOSIS — J3089 Other allergic rhinitis: Secondary | ICD-10-CM | POA: Diagnosis not present

## 2017-01-14 DIAGNOSIS — L814 Other melanin hyperpigmentation: Secondary | ICD-10-CM | POA: Diagnosis not present

## 2017-01-14 DIAGNOSIS — L570 Actinic keratosis: Secondary | ICD-10-CM | POA: Diagnosis not present

## 2017-01-14 DIAGNOSIS — D225 Melanocytic nevi of trunk: Secondary | ICD-10-CM | POA: Diagnosis not present

## 2017-01-14 DIAGNOSIS — L821 Other seborrheic keratosis: Secondary | ICD-10-CM | POA: Diagnosis not present

## 2017-01-19 DIAGNOSIS — J3089 Other allergic rhinitis: Secondary | ICD-10-CM | POA: Diagnosis not present

## 2017-01-25 ENCOUNTER — Other Ambulatory Visit: Payer: Self-pay | Admitting: Family Medicine

## 2017-01-26 DIAGNOSIS — J3089 Other allergic rhinitis: Secondary | ICD-10-CM | POA: Diagnosis not present

## 2017-02-02 DIAGNOSIS — J3089 Other allergic rhinitis: Secondary | ICD-10-CM | POA: Diagnosis not present

## 2017-02-07 ENCOUNTER — Other Ambulatory Visit: Payer: Self-pay | Admitting: Family Medicine

## 2017-02-07 DIAGNOSIS — R7303 Prediabetes: Secondary | ICD-10-CM

## 2017-02-07 DIAGNOSIS — E041 Nontoxic single thyroid nodule: Secondary | ICD-10-CM

## 2017-02-07 DIAGNOSIS — E785 Hyperlipidemia, unspecified: Secondary | ICD-10-CM

## 2017-02-07 DIAGNOSIS — E538 Deficiency of other specified B group vitamins: Secondary | ICD-10-CM

## 2017-02-07 DIAGNOSIS — Z1159 Encounter for screening for other viral diseases: Secondary | ICD-10-CM

## 2017-02-07 DIAGNOSIS — I10 Essential (primary) hypertension: Secondary | ICD-10-CM

## 2017-02-08 ENCOUNTER — Encounter (INDEPENDENT_AMBULATORY_CARE_PROVIDER_SITE_OTHER): Payer: Self-pay

## 2017-02-08 ENCOUNTER — Ambulatory Visit: Payer: PRIVATE HEALTH INSURANCE

## 2017-02-08 ENCOUNTER — Other Ambulatory Visit: Payer: PRIVATE HEALTH INSURANCE

## 2017-02-08 ENCOUNTER — Other Ambulatory Visit (INDEPENDENT_AMBULATORY_CARE_PROVIDER_SITE_OTHER): Payer: Medicare Other

## 2017-02-08 DIAGNOSIS — Z1159 Encounter for screening for other viral diseases: Secondary | ICD-10-CM | POA: Diagnosis not present

## 2017-02-08 DIAGNOSIS — R7303 Prediabetes: Secondary | ICD-10-CM

## 2017-02-08 DIAGNOSIS — I10 Essential (primary) hypertension: Secondary | ICD-10-CM

## 2017-02-08 DIAGNOSIS — E041 Nontoxic single thyroid nodule: Secondary | ICD-10-CM

## 2017-02-08 DIAGNOSIS — E785 Hyperlipidemia, unspecified: Secondary | ICD-10-CM

## 2017-02-08 DIAGNOSIS — E538 Deficiency of other specified B group vitamins: Secondary | ICD-10-CM | POA: Diagnosis not present

## 2017-02-08 LAB — BASIC METABOLIC PANEL
BUN: 14 mg/dL (ref 6–23)
CHLORIDE: 106 meq/L (ref 96–112)
CO2: 31 meq/L (ref 19–32)
CREATININE: 0.76 mg/dL (ref 0.40–1.20)
Calcium: 9.4 mg/dL (ref 8.4–10.5)
GFR: 80.35 mL/min (ref >60.00)
Glucose, Bld: 93 mg/dL (ref 70–99)
Potassium: 4.4 mEq/L (ref 3.5–5.1)
Sodium: 141 mEq/L (ref 135–145)

## 2017-02-08 LAB — LIPID PANEL
Cholesterol: 234 mg/dL — ABNORMAL HIGH (ref 0–200)
HDL: 56.5 mg/dL (ref >39.00)
LDL Cholesterol: 161 mg/dL — ABNORMAL HIGH (ref 0–99)
NonHDL: 177.13
TRIGLYCERIDES: 83 mg/dL (ref 0.0–149.0)
Total CHOL/HDL Ratio: 4
VLDL: 16.6 mg/dL (ref 0.0–40.0)

## 2017-02-08 LAB — HEMOGLOBIN A1C: Hgb A1c MFr Bld: 6.2 % (ref 4.6–6.5)

## 2017-02-08 LAB — HEPATITIS C ANTIBODY: HCV Ab: NEGATIVE

## 2017-02-09 DIAGNOSIS — J3089 Other allergic rhinitis: Secondary | ICD-10-CM | POA: Diagnosis not present

## 2017-02-09 LAB — VITAMIN B12: VITAMIN B 12: 323 pg/mL (ref 211–911)

## 2017-02-09 LAB — TSH: TSH: 5.63 u[IU]/mL — AB (ref 0.35–4.50)

## 2017-02-11 ENCOUNTER — Other Ambulatory Visit: Payer: Self-pay | Admitting: Family Medicine

## 2017-02-11 NOTE — Telephone Encounter (Signed)
plz phone in. 

## 2017-02-11 NOTE — Telephone Encounter (Signed)
Ok to refill? Last filled 12/25/16 #30 0RF

## 2017-02-11 NOTE — Telephone Encounter (Signed)
Rx called in as directed.   

## 2017-02-15 ENCOUNTER — Encounter: Payer: Self-pay | Admitting: Family Medicine

## 2017-02-15 ENCOUNTER — Ambulatory Visit (INDEPENDENT_AMBULATORY_CARE_PROVIDER_SITE_OTHER): Payer: Medicare Other | Admitting: Family Medicine

## 2017-02-15 VITALS — BP 128/72 | HR 64 | Temp 97.8°F | Ht 62.5 in | Wt 127.8 lb

## 2017-02-15 DIAGNOSIS — E041 Nontoxic single thyroid nodule: Secondary | ICD-10-CM | POA: Diagnosis not present

## 2017-02-15 DIAGNOSIS — Z23 Encounter for immunization: Secondary | ICD-10-CM | POA: Diagnosis not present

## 2017-02-15 DIAGNOSIS — F331 Major depressive disorder, recurrent, moderate: Secondary | ICD-10-CM | POA: Diagnosis not present

## 2017-02-15 DIAGNOSIS — E8940 Asymptomatic postprocedural ovarian failure: Secondary | ICD-10-CM

## 2017-02-15 DIAGNOSIS — Z7189 Other specified counseling: Secondary | ICD-10-CM | POA: Diagnosis not present

## 2017-02-15 DIAGNOSIS — E785 Hyperlipidemia, unspecified: Secondary | ICD-10-CM | POA: Diagnosis not present

## 2017-02-15 DIAGNOSIS — I10 Essential (primary) hypertension: Secondary | ICD-10-CM

## 2017-02-15 DIAGNOSIS — R7303 Prediabetes: Secondary | ICD-10-CM

## 2017-02-15 DIAGNOSIS — M1990 Unspecified osteoarthritis, unspecified site: Secondary | ICD-10-CM

## 2017-02-15 MED ORDER — SERTRALINE HCL 100 MG PO TABS: 100.0000 mg | ORAL_TABLET | Freq: Every day | ORAL | 8 refills | Status: DC

## 2017-02-15 NOTE — Assessment & Plan Note (Signed)
Encouraged avoiding added sugars and sweetened beverages.

## 2017-02-15 NOTE — Assessment & Plan Note (Signed)
Palpable. Benign biopsy 08/2014. Continue to monitor.

## 2017-02-15 NOTE — Assessment & Plan Note (Signed)
Deteriorated off med. Continue to monitor.  ASCVD 10 yr risk = 10.8% - will discuss statin next visit.

## 2017-02-15 NOTE — Patient Instructions (Addendum)
Sign release for records from bone density scan from Dr Rex Kras ([place]) Prevnar today.  Work on health care power of attorney form.  May use tylenol as needed for aches. May continue heating pad.  We will watch thyroid. Continue daily b12 vitamin.  Return in 6 months for recheck.

## 2017-02-15 NOTE — Addendum Note (Signed)
Addended by: Royann Shivers A on: 02/15/2017 12:13 PM   Modules accepted: Orders

## 2017-02-15 NOTE — Assessment & Plan Note (Signed)
Fully tapered off premarin 06/2016. Doing well.

## 2017-02-15 NOTE — Progress Notes (Signed)
BP 128/72   Pulse 64   Temp 97.8 F (36.6 C) (Oral)   Ht 5' 2.5" (1.588 m)   Wt 127 lb 12 oz (57.9 kg)   BMI 22.99 kg/m    CC: f/u visit Subjective:    Patient ID: Donna Vargas, female    DOB: 16-Aug-1946, 71 y.o.   MRN: [id number]  HPI: Donna Vargas is a 71 y.o. female presenting on 02/15/2017 for Annual Exam   Upcoming appt with Katha Cabal later today.  She completely stopped premarin 06/2016. No troubles coming off.  Doing well with sertraline 50mg  daily. Has cut down on klonopin to 1/2 tablet nightly. Mild irritability. Stays restless - feels this is her personality. Sleeps well at night time. Family worried about her ongoing anxiety.   Ongoing R arm pain that starts in shoulder and can radiate down to fingers. Treats with heating pad.   Preventative:  COLONOSCOPY 10/2008 WNL rpt 10 yrs Amedeo Plenty) TOTAL ABDOMINAL HYSTERECTOMY Date: 1995 both ovaries removed. CA125 level elevated - but no cancer.  Pap smear 2014 - continued despite hysterectomy. Always normal. Discussed we will stop pelvic cancer screening.  Breast cancer screening - normal mammo 07/2016. Breast exams at home.  DEXA - had one and normal per patient. Thinks ~2013. Declines recheck for now.  Flu shot declines Prevnar today Tdap 06/2014 Shingles vaccine - declines. Has had shingles. Advanced directive: living will scanned 01/2015. Working on [employer] form. Would want husband to be HCPOA. Has not filled out yet.  Seat belt use discussed Sunscreen use discussed. No changing moles on skin. Sees derm regularly.  Non smoker Alcohol - none G1P1.   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 Diet: no water, some fruits/vegetables  Relevant past medical, surgical, family and social history reviewed and updated as indicated. Interim medical history since our last  visit reviewed. Allergies and medications reviewed and updated. Outpatient Medications Prior to Visit  Medication Sig Dispense Refill  . aspirin 81 MG tablet Take 81 mg by mouth daily.    . carvedilol (COREG) 6.25 MG tablet Take 1 tablet (6.25 mg total) by mouth daily. 30 tablet 11  . clonazePAM (KLONOPIN) 0.5 MG tablet TAKE 1 TABLET BY MOUTH AT BEDTIME (Patient taking differently: TAKE 0.5 TABLET BY MOUTH AT BEDTIME) 30 tablet 0  . cyanocobalamin (V-R VITAMIN B-12) 500 MCG tablet Take 1 tablet (500 mcg total) by mouth daily.    . diphenhydrAMINE (BENADRYL) 25 mg capsule Take 25 mg by mouth at bedtime.    Marland Kitchen levocetirizine (XYZAL) 5 MG tablet Take 5 mg by mouth at bedtime.    . montelukast (SINGULAIR) 10 MG tablet TAKE 1 TABLET BY MOUTH AT BEDTIME. 30 tablet 11  . sertraline (ZOLOFT) 50 MG tablet TAKE 1 TABLET BY MOUTH DAILY. 30 tablet 3  . estrogens, conjugated, (PREMARIN) 0.3 MG tablet TAKE 1/2 TABLET (0.15 MG TOTAL) BY MOUTH EVERY OTHER DAY. 30 tablet 6  . sertraline (ZOLOFT) 25 MG tablet Take 1 tablet (25 mg total) by mouth daily. 30 tablet 6   No facility-administered medications prior to visit.      Per HPI unless specifically indicated in ROS section below Review of Systems     Objective:    BP 128/72   Pulse 64   Temp 97.8 F (36.6 C) (Oral)   Ht 5' 2.5" (1.588 m)   Wt 127 lb  12 oz (57.9 kg)   BMI 22.99 kg/m   Wt Readings from Last 3 Encounters:  02/15/17 127 lb 12 oz (57.9 kg)  06/08/16 120 lb 8 oz (54.7 kg)  03/04/16 116 lb (52.6 kg)    Physical Exam  Constitutional: She is oriented to person, place, and time. She appears well-developed and well-nourished. No distress.  HENT:  Head: Normocephalic and atraumatic.  Mouth/Throat: Oropharynx is clear and moist. No oropharyngeal exudate.  Eyes: Conjunctivae and EOM are normal. Pupils are equal, round, and reactive to light. No scleral icterus.  Neck: No thyromegaly (R nodule) present.  Cardiovascular: Normal rate,  regular rhythm, normal heart sounds and intact distal pulses.   No murmur heard. Pulmonary/Chest: Effort normal and breath sounds normal. No respiratory distress. She has no wheezes. She has no rales.  Musculoskeletal: She exhibits no edema.  FROM at neck and shoulders No pain midline cervical spine or shoulder palpation  Neurological: She is alert and oriented to person, place, and time.  Skin: Skin is warm and dry.  Psychiatric: She has a normal mood and affect. Her behavior is normal.  Nursing note and vitals reviewed.  Results for orders placed or performed in visit on 02/08/17  Lipid panel  Result Value Ref Range   Cholesterol 234 (H) 0 - 200 mg/dL   Triglycerides 83.0 0.0 - 149.0 mg/dL   HDL 56.50 >39.00 mg/dL   VLDL 16.6 0.0 - 40.0 mg/dL   LDL Cholesterol 161 (H) 0 - 99 mg/dL   Total CHOL/HDL Ratio 4    NonHDL 161.09   Basic metabolic panel  Result Value Ref Range   Sodium 141 135 - 145 mEq/L   Potassium 4.4 3.5 - 5.1 mEq/L   Chloride 106 96 - 112 mEq/L   CO2 31 19 - 32 mEq/L   Glucose, Bld 93 70 - 99 mg/dL   BUN 14 6 - 23 mg/dL   Creatinine, Ser 0.76 0.40 - 1.20 mg/dL   Calcium 9.4 8.4 - 10.5 mg/dL   GFR 80.35 >60.00 mL/min  Hemoglobin A1c  Result Value Ref Range   Hgb A1c MFr Bld 6.2 4.6 - 6.5 %  Vitamin B12  Result Value Ref Range   Vitamin B-12 323 211 - 911 pg/mL  TSH  Result Value Ref Range   TSH 5.63 (H) 0.35 - 4.50 uIU/mL  Hepatitis C antibody  Result Value Ref Range   HCV Ab NEGATIVE NEGATIVE      Assessment & Plan:   Problem List Items Addressed This Visit    Advanced care planning/counseling discussion    Advanced directive: living will scanned 01/2015. Working on [employer] form. Would want husband to be HCPOA. Has not filled out yet.       Arthritis    Benign exam today - anticipate OA related. Discussed gentle stretching and heating pad, and glucosamine.       HLD (hyperlipidemia)    Deteriorated off med. Continue to monitor.  ASCVD 10 yr risk  = 10.8% - will discuss statin next visit.       Hypertension    Chronic, stable. Continue carvedilol 6.25mg  once daily.       MDD (major depressive disorder), recurrent episode, moderate ([place]) - Primary    Will increase sertraline to 100mg  daily per pt request  Continue klonopin 0.25mg  nightly (1/2 tab of 0.5mg )      Relevant Medications   sertraline (ZOLOFT) 100 MG tablet   Prediabetes    Encouraged avoiding added sugars and  sweetened beverages.       Right thyroid nodule    Palpable. Benign biopsy 08/2014. Continue to monitor.       Surgical menopause    Fully tapered off premarin 06/2016. Doing well.           Follow up plan: Return in about 6 months (around 08/18/2017) for follow up visit.  [name], MD

## 2017-02-15 NOTE — Progress Notes (Signed)
Pre visit review using our clinic review tool, if applicable. No additional management support is needed unless otherwise documented below in the visit note. 

## 2017-02-15 NOTE — Assessment & Plan Note (Signed)
Benign exam today - anticipate OA related. Discussed gentle stretching and heating pad, and glucosamine.

## 2017-02-15 NOTE — Assessment & Plan Note (Addendum)
Advanced directive: living will scanned 01/2015. Working on [employer] form. Would want husband to be HCPOA. Has not filled out yet.

## 2017-02-15 NOTE — Assessment & Plan Note (Signed)
Chronic, stable. Continue carvedilol 6.25mg  once daily.

## 2017-02-15 NOTE — Assessment & Plan Note (Signed)
Will increase sertraline to 100mg  daily per pt request  Continue klonopin 0.25mg  nightly (1/2 tab of 0.5mg )

## 2017-02-16 DIAGNOSIS — J3089 Other allergic rhinitis: Secondary | ICD-10-CM | POA: Diagnosis not present

## 2017-02-23 DIAGNOSIS — J3089 Other allergic rhinitis: Secondary | ICD-10-CM | POA: Diagnosis not present

## 2017-03-10 ENCOUNTER — Ambulatory Visit (INDEPENDENT_AMBULATORY_CARE_PROVIDER_SITE_OTHER): Payer: Medicare Other

## 2017-03-10 VITALS — BP 116/70 | HR 59 | Temp 97.8°F | Ht 63.0 in | Wt 129.8 lb

## 2017-03-10 DIAGNOSIS — Z0000 Encounter for general adult medical examination without abnormal findings: Secondary | ICD-10-CM | POA: Diagnosis not present

## 2017-03-10 NOTE — Progress Notes (Signed)
PCP notes:   Health maintenance:  Bone density - pt desires to completed same day as mammogram  Abnormal screenings:   None  Patient concerns:   None  Nurse concerns:  None  Next PCP appt:   08/18/17 @ 0915

## 2017-03-10 NOTE — Progress Notes (Signed)
Pre visit review using our clinic review tool, if applicable. No additional management support is needed unless otherwise documented below in the visit note. 

## 2017-03-10 NOTE — Patient Instructions (Signed)
Donna Vargas , Thank you for taking time to come for your Medicare Wellness Visit. I appreciate your ongoing commitment to your health goals. Please review the following plan we discussed and let me know if I can assist you in the future.   These are the goals we discussed: Goals    . Increase physical activity          When weather permits, I will continue to do yard work and garden for at least 30 min daily.        This is a list of the screening recommended for you and due dates:  Health Maintenance  Topic Date Due  . DEXA scan (bone density measurement)  03/10/2018*  . Flu Shot  06/30/2017  . Pneumonia vaccines (2 of 2 - PPSV23) 02/15/2018  . Mammogram  08/20/2018  . Colon Cancer Screening  11/07/2018  . DTaP/Tdap/Td vaccine (2 - Td) 06/30/2024  . Tetanus Vaccine  06/30/2024  .  Hepatitis C: One time screening is recommended by Center for Disease Control  (CDC) for  adults born from [birth year] through 1963.   Completed  *Topic was postponed. The date shown is not the original due date.   Preventive Care for Adults  A healthy lifestyle and preventive care can promote health and wellness. Preventive health guidelines for adults include the following key practices.  . A routine yearly physical is a good way to check with your health care provider about your health and preventive screening. It is a chance to share any concerns and updates on your health and to receive a thorough exam.  . Visit your dentist for a routine exam and preventive care every 6 months. Brush your teeth twice a day and floss once a day. Good oral hygiene prevents tooth decay and gum disease.  . The frequency of eye exams is based on your age, health, family medical history, use  of contact lenses, and other factors. Follow your health care provider's ecommendations for frequency of eye exams.  . Eat a healthy diet. Foods like vegetables, fruits, whole grains, low-fat dairy products, and lean protein foods contain  the nutrients you need without too many calories. Decrease your intake of foods high in solid fats, added sugars, and salt. Eat the right amount of calories for you. Get information about a proper diet from your health care provider, if necessary.  . Regular physical exercise is one of the most important things you can do for your health. Most adults should get at least 150 minutes of moderate-intensity exercise (any activity that increases your heart rate and causes you to sweat) each week. In addition, most adults need muscle-strengthening exercises on 2 or more days a week.  Silver Sneakers may be a benefit available to you. To determine eligibility, you may visit the website: www.silversneakers.com or contact program at 937-566-8354 Mon-Fri between 8AM-8PM.   . Maintain a healthy weight. The body mass index (BMI) is a screening tool to identify possible weight problems. It provides an estimate of body fat based on height and weight. Your health care provider can find your BMI and can help you achieve or maintain a healthy weight.   For adults 20 years and older: ? A BMI below 18.5 is considered underweight. ? A BMI of 18.5 to 24.9 is normal. ? A BMI of 25 to 29.9 is considered overweight. ? A BMI of 30 and above is considered obese.   . Maintain normal blood lipids and cholesterol levels by exercising  and minimizing your intake of saturated fat. Eat a balanced diet with plenty of fruit and vegetables. Blood tests for lipids and cholesterol should begin at age 71 and be repeated every 5 years. If your lipid or cholesterol levels are high, you are over 50, or you are at high risk for heart disease, you may need your cholesterol levels checked more frequently. Ongoing high lipid and cholesterol levels should be treated with medicines if diet and exercise are not working.  . If you smoke, find out from your health care provider how to quit. If you do not use tobacco, please do not start.  . If  you choose to drink alcohol, please do not consume more than 2 drinks per day. One drink is considered to be 12 ounces (355 mL) of beer, 5 ounces (148 mL) of wine, or 1.5 ounces (44 mL) of liquor.  . If you are 80-9 years old, ask your health care provider if you should take aspirin to prevent strokes.  . Use sunscreen. Apply sunscreen liberally and repeatedly throughout the day. You should seek shade when your shadow is shorter than you. Protect yourself by wearing long sleeves, pants, a wide-brimmed hat, and sunglasses year round, whenever you are outdoors.  . Once a month, do a whole body skin exam, using a mirror to look at the skin on your back. Tell your health care provider of new moles, moles that have irregular borders, moles that are larger than a pencil eraser, or moles that have changed in shape or color.

## 2017-03-10 NOTE — Progress Notes (Signed)
Subjective:   Donna Vargas is a 71 y.o. female who presents for Medicare Annual (Subsequent) preventive examination.  Review of Systems:  N/A Cardiac Risk Factors include: advanced age (>83men, >35 women);dyslipidemia;hypertension     Objective:     Vitals: BP 116/70 (BP Location: Right Arm, Patient Position: Sitting, Cuff Size: Normal)   Pulse (!) 59   Temp 97.8 F (36.6 C) (Oral)   Ht 5\' 3"  (1.6 m)   Wt 129 lb 12 oz (58.9 kg)   SpO2 97%   BMI 22.98 kg/m   Body mass index is 22.98 kg/m.   Tobacco History  Smoking Status  . Never Smoker  Smokeless Tobacco  . Never Used     Counseling given: No   Past Medical History:  Diagnosis Date  . Abnormal drug screen 05/2016   inapprop negative klonopin rpt 3 mo (05/2016)  . Arthritis    fingers  . Depression with anxiety   . 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 History of asthma 1990s   with bronchitis  . History of diverticulitis    ?IBS  . Hypertension   . Hypothyroidism 2015   ?by prior PCP  . Perennial allergic rhinitis    allergy shots (sharma)  . Prediabetes 06/2014  . Right thyroid nodule 09/11/2014   S/p biopsy showing benign colloid nodule (08/2014)   . 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 CATARACT EXTRACTION Left 03/2015   Groat  . COLONOSCOPY  10/2008   WNL rpt 10 yrs Amedeo Plenty)  . ESOPHAGOGASTRODUODENOSCOPY  10/2008   gastritis, CLO neg Amedeo Plenty)  . OOPHORECTOMY  1990s   growth on ovary - benign  . TOTAL ABDOMINAL HYSTERECTOMY  1995   both ovaries removed. CA125 level elevated - but no cancer   Family History  Problem Relation Age of Onset  . Cancer Mother     ovarian x2  . Ovarian cancer Mother   . CAD Father     unclear  . Breast cancer Maternal Grandmother   . CAD Maternal Grandfather   . Breast cancer Cousin   . Hypertension Neg Hx   . Diabetes Neg Hx    History  Sexual Activity  . Sexual activity: Yes    Outpatient  Encounter Prescriptions as of 03/10/2017  Medication Sig  . aspirin 81 MG tablet Take 81 mg by mouth daily.  . carvedilol (COREG) 6.25 MG tablet Take 1 tablet (6.25 mg total) by mouth daily.  . clonazePAM (KLONOPIN) 0.5 MG tablet TAKE 1 TABLET BY MOUTH AT BEDTIME (Patient taking differently: TAKE 0.5 TABLET BY MOUTH AT BEDTIME)  . cyanocobalamin (V-R VITAMIN B-12) 500 MCG tablet Take 1 tablet (500 mcg total) by mouth daily.  . diphenhydrAMINE (BENADRYL) 25 mg capsule Take 25 mg by mouth at bedtime.  Marland Kitchen levocetirizine (XYZAL) 5 MG tablet Take 5 mg by mouth at bedtime.  . montelukast (SINGULAIR) 10 MG tablet TAKE 1 TABLET BY MOUTH AT BEDTIME.  Marland Kitchen sertraline (ZOLOFT) 100 MG tablet Take 1 tablet (100 mg total) by mouth daily.   No facility-administered encounter medications on file as of 03/10/2017.     Activities of Daily Living In your present state of health, do you have any difficulty performing the following activities: 03/10/2017  Hearing? N  Vision? N  Difficulty concentrating or making decisions? N  Walking or climbing stairs? N  Dressing or bathing? N  Doing errands, shopping?  N  Preparing Food and eating ? N  Using the Toilet? N  In the past six months, have you accidently leaked urine? Y  Do you have problems with loss of bowel control? N  Managing your Medications? N  Managing your Finances? N  Housekeeping or managing your Housekeeping? N  Some recent data might be hidden    Patient Care Team: [name], MD as PCP - General (Family Medicine)    Assessment:     Hearing Screening   125Hz  250Hz  500Hz  1000Hz  2000Hz  3000Hz  4000Hz  6000Hz  8000Hz   Right ear:   40 40 40  40    Left ear:   40 40 40  40      Visual Acuity Screening   Right eye Left eye Both eyes  Without correction:     With correction: 20/20-1 20/20 20/15    Exercise Activities and Dietary recommendations Current Exercise Habits: The patient does not participate in regular exercise at present (pt  does garden work and feeds farm animals), Time (Minutes): 30, Frequency (Times/Week): 7, Weekly Exercise (Minutes/Week): 210, Intensity: Moderate, Exercise limited by: None identified  Goals    . Increase physical activity          When weather permits, I will continue to do yard work and garden for at least 30 min daily.       Fall Risk Fall Risk  03/10/2017 02/15/2017 11/05/2016 10/17/2015 08/12/2015  Falls in the past year? No No No No No  Number falls in past yr: - - - - -  Injury with Fall? - - - - -   Depression Screen PHQ 2/9 Scores 03/10/2017 02/15/2017 10/17/2015 08/12/2015  PHQ - 2 Score 0 0 0 0  PHQ- 9 Score - - - 0  Exception Documentation - - - Patient refusal     Cognitive Function MMSE - Mini Mental State Exam 03/10/2017  Orientation to time 5  Orientation to Place 5  Registration 3  Attention/ Calculation 0  Recall 3  Language- name 2 objects 0  Language- repeat 1  Language- follow 3 step command 3  Language- read & follow direction 0  Write a sentence 0  Copy design 0  Total score 20     PLEASE NOTE: A Mini-Cog screen was completed. Maximum score is 20. A value of 0 denotes this part of Folstein MMSE was not completed or the patient failed this part of the Mini-Cog screening.   Mini-Cog Screening Orientation to Time - Max 5 pts Orientation to Place - Max 5 pts Registration - Max 3 pts Recall - Max 3 pts Language Repeat - Max 1 pts Language Follow 3 Step Command - Max 3 pts     Immunization History  Administered Date(s) Administered  . Pneumococcal Conjugate-13 02/15/2017  . Tdap 06/30/2014   Screening Tests Health Maintenance  Topic Date Due  . DEXA SCAN  03/10/2018 (Originally 09/25/2013)  . INFLUENZA VACCINE  06/30/2017  . PNA vac Low Risk Adult (2 of 2 - PPSV23) 02/15/2018  . MAMMOGRAM  08/20/2018  . COLONOSCOPY  11/07/2018  . DTaP/Tdap/Td (2 - Td) 06/30/2024  . TETANUS/TDAP  06/30/2024  . Hepatitis C Screening  Completed      Plan:       I have personally reviewed and addressed the Medicare Annual Wellness questionnaire and have noted the following in the patient's chart:  A. Medical and social history B. Use of alcohol, tobacco or illicit drugs  C. Current medications and supplements  D. Functional ability and status E.  Nutritional status F.  Physical activity G. Advance directives H. List of other physicians I.  Hospitalizations, surgeries, and ER visits in previous 12 months J.  Brilliant to include hearing, vision, cognitive, depression L. Referrals and appointments - none  In addition, I have reviewed and discussed with patient certain preventive protocols, quality metrics, and best practice recommendations. A written personalized care plan for preventive services as well as general preventive health recommendations were provided to patient.  See attached scanned questionnaire for additional information.   Signed,   Lindell Noe, MHA, BS, LPN Health Coach

## 2017-03-11 NOTE — Progress Notes (Signed)
I reviewed health advisor's note, was available for consultation, and agree with documentation and plan.  

## 2017-03-12 DIAGNOSIS — J3089 Other allergic rhinitis: Secondary | ICD-10-CM | POA: Diagnosis not present

## 2017-03-16 DIAGNOSIS — J3089 Other allergic rhinitis: Secondary | ICD-10-CM | POA: Diagnosis not present

## 2017-03-17 ENCOUNTER — Other Ambulatory Visit: Payer: Self-pay | Admitting: Family Medicine

## 2017-03-17 NOTE — Telephone Encounter (Signed)
plz phone in. 

## 2017-03-17 NOTE — Telephone Encounter (Signed)
Ok to refill? Last filled 02/11/17 #30 0RF

## 2017-03-17 NOTE — Telephone Encounter (Signed)
Attempted to call in x3. Apparently their phones are down. Will try again later.

## 2017-03-18 NOTE — Telephone Encounter (Signed)
Rx called in as directed.   

## 2017-03-23 DIAGNOSIS — J3089 Other allergic rhinitis: Secondary | ICD-10-CM | POA: Diagnosis not present

## 2017-03-30 DIAGNOSIS — J3089 Other allergic rhinitis: Secondary | ICD-10-CM | POA: Diagnosis not present

## 2017-04-01 DIAGNOSIS — J3089 Other allergic rhinitis: Secondary | ICD-10-CM | POA: Diagnosis not present

## 2017-04-06 DIAGNOSIS — J3089 Other allergic rhinitis: Secondary | ICD-10-CM | POA: Diagnosis not present

## 2017-04-13 DIAGNOSIS — J3089 Other allergic rhinitis: Secondary | ICD-10-CM | POA: Diagnosis not present

## 2017-04-20 DIAGNOSIS — J3089 Other allergic rhinitis: Secondary | ICD-10-CM | POA: Diagnosis not present

## 2017-04-29 DIAGNOSIS — J3089 Other allergic rhinitis: Secondary | ICD-10-CM | POA: Diagnosis not present

## 2017-05-04 DIAGNOSIS — J3089 Other allergic rhinitis: Secondary | ICD-10-CM | POA: Diagnosis not present

## 2017-05-11 DIAGNOSIS — J3089 Other allergic rhinitis: Secondary | ICD-10-CM | POA: Diagnosis not present

## 2017-05-18 DIAGNOSIS — J3089 Other allergic rhinitis: Secondary | ICD-10-CM | POA: Diagnosis not present

## 2017-05-25 DIAGNOSIS — J3089 Other allergic rhinitis: Secondary | ICD-10-CM | POA: Diagnosis not present

## 2017-06-01 DIAGNOSIS — J3089 Other allergic rhinitis: Secondary | ICD-10-CM | POA: Diagnosis not present

## 2017-06-03 ENCOUNTER — Other Ambulatory Visit: Payer: Self-pay | Admitting: Family Medicine

## 2017-06-03 NOTE — Telephone Encounter (Signed)
F/u scheduled on 08/18/17, last filled on 03/17/17 #30 tabs with 1 additional refill, please advise

## 2017-06-04 NOTE — Telephone Encounter (Signed)
Rx called in to requested pharmacy 

## 2017-06-04 NOTE — Telephone Encounter (Signed)
plz phone in. 

## 2017-06-10 DIAGNOSIS — R21 Rash and other nonspecific skin eruption: Secondary | ICD-10-CM | POA: Diagnosis not present

## 2017-06-10 DIAGNOSIS — J3089 Other allergic rhinitis: Secondary | ICD-10-CM | POA: Diagnosis not present

## 2017-06-15 DIAGNOSIS — J3089 Other allergic rhinitis: Secondary | ICD-10-CM | POA: Diagnosis not present

## 2017-06-24 ENCOUNTER — Other Ambulatory Visit: Payer: Self-pay | Admitting: Family Medicine

## 2017-06-24 DIAGNOSIS — J3089 Other allergic rhinitis: Secondary | ICD-10-CM | POA: Diagnosis not present

## 2017-07-08 DIAGNOSIS — J3089 Other allergic rhinitis: Secondary | ICD-10-CM | POA: Diagnosis not present

## 2017-07-09 ENCOUNTER — Ambulatory Visit (INDEPENDENT_AMBULATORY_CARE_PROVIDER_SITE_OTHER): Payer: Medicare Other | Admitting: Family Medicine

## 2017-07-09 ENCOUNTER — Telehealth: Payer: Self-pay | Admitting: Family Medicine

## 2017-07-09 ENCOUNTER — Encounter: Payer: Self-pay | Admitting: Family Medicine

## 2017-07-09 VITALS — BP 136/72 | HR 69 | Temp 98.5°F | Resp 16 | Ht 62.5 in | Wt 134.4 lb

## 2017-07-09 DIAGNOSIS — N649 Disorder of breast, unspecified: Secondary | ICD-10-CM

## 2017-07-09 DIAGNOSIS — H6121 Impacted cerumen, right ear: Secondary | ICD-10-CM

## 2017-07-09 DIAGNOSIS — L988 Other specified disorders of the skin and subcutaneous tissue: Secondary | ICD-10-CM

## 2017-07-09 DIAGNOSIS — H6120 Impacted cerumen, unspecified ear: Secondary | ICD-10-CM | POA: Insufficient documentation

## 2017-07-09 MED ORDER — CEPHALEXIN 500 MG PO CAPS: 500.0000 mg | ORAL_CAPSULE | Freq: Three times a day (TID) | ORAL | 0 refills | Status: DC

## 2017-07-09 MED ORDER — DESONIDE 0.05 % EX CREA: TOPICAL_CREAM | Freq: Two times a day (BID) | CUTANEOUS | 0 refills | Status: DC

## 2017-07-09 NOTE — Assessment & Plan Note (Signed)
Lesion on left breast seems confined to skin. Anticipate cellulitis/mastitis - will treat with keflex 500mg  TID x7d. Reasonable to update mammo/US to r/o early inflammatory breast cancer. Pt agrees with plan.

## 2017-07-09 NOTE — Telephone Encounter (Signed)
Order changed. Thanks!

## 2017-07-09 NOTE — Progress Notes (Signed)
BP 136/72   Pulse 69   Temp 98.5 F (36.9 C) (Oral)   Resp 16   Ht 5' 2.5" (1.588 m)   Wt 134 lb 6.4 oz (61 kg)   SpO2 98%   BMI 24.19 kg/m    CC: L breast rash, sinus congestion Subjective:    Patient ID: Tekisha Darcey, female    DOB: [birth year], 71 y.o.   MRN: [id number]  HPI: Jasira Robinson is a 71 y.o. female presenting on 07/09/2017 for Rash (Left breast, red, raised, bulls eye   x1week ) and Sinus Problem   1 wk h/o rash on left lower breast. May have started as bug bite, isn't sure. Not itchy or tender. Today mainly wants to rule out inflammatory breast cancer.   Mammogram Birads1 07/2016.  Ongoing chronic sinus congestion - would like ears checked. She did receive allergy shot Systems analyst) yesterday and continues allergy medication daily.  Relevant past medical, surgical, family and social history reviewed and updated as indicated. Interim medical history since our last visit reviewed. Allergies and medications reviewed and updated. Outpatient Medications Prior to Visit  Medication Sig Dispense Refill  . aspirin 81 MG tablet Take 81 mg by mouth daily.    . carvedilol (COREG) 6.25 MG tablet TAKE 1 TABLET BY MOUTH DAILY. 30 tablet 11  . clonazePAM (KLONOPIN) 0.5 MG tablet TAKE 1 TABLET BY MOUTH AT BEDTIME 30 tablet 1  . cyanocobalamin (V-R VITAMIN B-12) 500 MCG tablet Take 1 tablet (500 mcg total) by mouth daily.    . diphenhydrAMINE (BENADRYL) 25 mg capsule Take 25 mg by mouth at bedtime.    Marland Kitchen levocetirizine (XYZAL) 5 MG tablet Take 5 mg by mouth at bedtime.    . montelukast (SINGULAIR) 10 MG tablet TAKE 1 TABLET BY MOUTH AT BEDTIME. 30 tablet 11  . sertraline (ZOLOFT) 100 MG tablet Take 1 tablet (100 mg total) by mouth daily. 30 tablet 8   No facility-administered medications prior to visit.      Per HPI unless specifically indicated in ROS section below Review of Systems     Objective:    BP 136/72   Pulse 69   Temp 98.5 F (36.9 C) (Oral)    Resp 16   Ht 5' 2.5" (1.588 m)   Wt 134 lb 6.4 oz (61 kg)   SpO2 98%   BMI 24.19 kg/m   Wt Readings from Last 3 Encounters:  07/09/17 134 lb 6.4 oz (61 kg)  03/10/17 129 lb 12 oz (58.9 kg)  02/15/17 127 lb 12 oz (57.9 kg)    Physical Exam  Constitutional: She appears well-developed and well-nourished. No distress.  HENT:  Head: Normocephalic and atraumatic.  Right Ear: Hearing, external ear and ear canal normal.  Left Ear: Hearing, tympanic membrane, external ear and ear canal normal.  Nose: No mucosal edema or rhinorrhea. Right sinus exhibits no maxillary sinus tenderness and no frontal sinus tenderness. Left sinus exhibits no maxillary sinus tenderness and no frontal sinus tenderness.  Mouth/Throat: Uvula is midline, oropharynx is clear and moist and mucous membranes are normal. No oropharyngeal exudate, posterior oropharyngeal edema, posterior oropharyngeal erythema or tonsillar abscesses.  R TM covered by cerumen - cleared with plastic curette  Eyes: Pupils are equal, round, and reactive to light. Conjunctivae and EOM are normal. No scleral icterus.  Neck: Normal range of motion. Neck supple.  Cardiovascular: Normal rate, regular rhythm, normal heart sounds and intact distal pulses.   No murmur heard. Pulmonary/Chest: Effort  normal and breath sounds normal. No respiratory distress. She has no wheezes. She has no rales. Right breast exhibits no inverted nipple, no mass, no nipple discharge, no skin change and no tenderness. Left breast exhibits skin change. Left breast exhibits no inverted nipple, no mass, no nipple discharge and no tenderness.    Inferior to left nipple is area of thickened indurated skin with erythema about 2cm in diameter, no pruritis or tenderness  Lymphadenopathy:    She has no cervical adenopathy.    She has no axillary adenopathy.       Right axillary: No lateral adenopathy present.       Left axillary: No lateral adenopathy present.      Right: No  supraclavicular adenopathy present.       Left: No supraclavicular adenopathy present.  Skin: Skin is warm and dry. No rash noted.  Nursing note and vitals reviewed.     Assessment & Plan:   Problem List Items Addressed This Visit    Cerumen impaction    R cerumen impaction cleared by me today with plastic curette.       Lesion of skin of breast - Primary    Lesion on left breast seems confined to skin. Anticipate cellulitis/mastitis - will treat with keflex 500mg  TID x7d. Reasonable to update mammo/US to r/o early inflammatory breast cancer. Pt agrees with plan.       Relevant Orders   MM Digital Diagnostic Unilat L   US BREAST LTD UNI LEFT INC AXILLA       Follow up plan: No Follow-up on file.  [name], MD

## 2017-07-09 NOTE — Telephone Encounter (Signed)
Spoke to [employer]   She wanted order changed to [id number] She stated pt is due for yearly mammogram and if it is within month of last one they can go ahead bilateral mammogram Her last one was 07/2016

## 2017-07-09 NOTE — Assessment & Plan Note (Signed)
R cerumen impaction cleared by me today with plastic curette.

## 2017-07-09 NOTE — Patient Instructions (Addendum)
See Shirlean Mylar to schedule mammogram/ultrasound on left Possible skin infection of breast - treat with antibiotic sent to pharmacy, may also use steroid cream to breast. Use warm compresses to breast as well.

## 2017-07-12 ENCOUNTER — Telehealth: Payer: Self-pay | Admitting: *Deleted

## 2017-07-12 ENCOUNTER — Telehealth: Payer: Self-pay | Admitting: Family Medicine

## 2017-07-12 MED ORDER — HYDROCORTISONE 2.5 % EX CREA: TOPICAL_CREAM | Freq: Two times a day (BID) | CUTANEOUS | 0 refills | Status: DC

## 2017-07-12 NOTE — Telephone Encounter (Signed)
Appointment 8/23 @ 10 arrive @ 9:45 Pt aware

## 2017-07-12 NOTE — Telephone Encounter (Signed)
Caller Name:Cadence Dicke Relationship to Patient:self Best number:[phone] Pharmacy:  Reason for call:  Would like cb about scheduling mammo and ultrasound

## 2017-07-12 NOTE — Telephone Encounter (Signed)
Hydrocortisone cream sent to pharmacy.

## 2017-07-12 NOTE — Telephone Encounter (Signed)
Pharmacy contacted office and states desonide cream is not covered by pts insurance and they are requesting a new Rx for hydrocortisone or betamethasone cream. pls advise

## 2017-07-13 DIAGNOSIS — J3089 Other allergic rhinitis: Secondary | ICD-10-CM | POA: Diagnosis not present

## 2017-07-20 DIAGNOSIS — J3089 Other allergic rhinitis: Secondary | ICD-10-CM | POA: Diagnosis not present

## 2017-07-22 ENCOUNTER — Ambulatory Visit
Admission: RE | Admit: 2017-07-22 | Discharge: 2017-07-22 | Disposition: A | Discharge status: Home / Self Care | Payer: Medicare Other | Source: Ambulatory Visit | Attending: Family Medicine | Admitting: Family Medicine

## 2017-07-22 DIAGNOSIS — L988 Other specified disorders of the skin and subcutaneous tissue: Secondary | ICD-10-CM

## 2017-07-22 DIAGNOSIS — N649 Disorder of breast, unspecified: Secondary | ICD-10-CM

## 2017-07-22 DIAGNOSIS — N6489 Other specified disorders of breast: Secondary | ICD-10-CM | POA: Diagnosis not present

## 2017-07-22 DIAGNOSIS — R922 Inconclusive mammogram: Secondary | ICD-10-CM | POA: Diagnosis not present

## 2017-07-27 DIAGNOSIS — J3089 Other allergic rhinitis: Secondary | ICD-10-CM | POA: Diagnosis not present

## 2017-08-03 DIAGNOSIS — J3089 Other allergic rhinitis: Secondary | ICD-10-CM | POA: Diagnosis not present

## 2017-08-17 ENCOUNTER — Other Ambulatory Visit: Payer: Self-pay

## 2017-08-17 DIAGNOSIS — J3089 Other allergic rhinitis: Secondary | ICD-10-CM | POA: Diagnosis not present

## 2017-08-17 NOTE — Telephone Encounter (Signed)
Last filled 07/15/17 #30, last OV 07/09/17 next OV tomorrow 08/18/17

## 2017-08-18 ENCOUNTER — Ambulatory Visit (INDEPENDENT_AMBULATORY_CARE_PROVIDER_SITE_OTHER): Payer: Medicare Other | Admitting: Family Medicine

## 2017-08-18 ENCOUNTER — Encounter: Payer: Self-pay | Admitting: Family Medicine

## 2017-08-18 ENCOUNTER — Ambulatory Visit (INDEPENDENT_AMBULATORY_CARE_PROVIDER_SITE_OTHER)
Admission: RE | Admit: 2017-08-18 | Discharge: 2017-08-18 | Disposition: A | Discharge status: Home / Self Care | Payer: Medicare Other | Source: Ambulatory Visit | Attending: Family Medicine | Admitting: Family Medicine

## 2017-08-18 ENCOUNTER — Ambulatory Visit: Payer: PRIVATE HEALTH INSURANCE | Admitting: Family Medicine

## 2017-08-18 VITALS — BP 120/62 | HR 69 | Temp 97.7°F | Wt 133.8 lb

## 2017-08-18 DIAGNOSIS — R946 Abnormal results of thyroid function studies: Secondary | ICD-10-CM

## 2017-08-18 DIAGNOSIS — R1031 Right lower quadrant pain: Secondary | ICD-10-CM | POA: Insufficient documentation

## 2017-08-18 DIAGNOSIS — E041 Nontoxic single thyroid nodule: Secondary | ICD-10-CM

## 2017-08-18 DIAGNOSIS — N649 Disorder of breast, unspecified: Secondary | ICD-10-CM | POA: Diagnosis not present

## 2017-08-18 DIAGNOSIS — R7989 Other specified abnormal findings of blood chemistry: Secondary | ICD-10-CM

## 2017-08-18 DIAGNOSIS — L988 Other specified disorders of the skin and subcutaneous tissue: Secondary | ICD-10-CM

## 2017-08-18 DIAGNOSIS — E039 Hypothyroidism, unspecified: Secondary | ICD-10-CM | POA: Insufficient documentation

## 2017-08-18 DIAGNOSIS — R1030 Lower abdominal pain, unspecified: Secondary | ICD-10-CM | POA: Diagnosis not present

## 2017-08-18 LAB — TSH: TSH: 4.6 u[IU]/mL — AB (ref 0.35–4.50)

## 2017-08-18 LAB — T4, FREE: FREE T4: 0.74 ng/dL (ref 0.60–1.60)

## 2017-08-18 MED ORDER — CLONAZEPAM 0.5 MG PO TABS: 0.5000 mg | ORAL_TABLET | Freq: Every day | ORAL | 1 refills | Status: DC

## 2017-08-18 NOTE — Telephone Encounter (Signed)
plz phone in. 

## 2017-08-18 NOTE — Assessment & Plan Note (Signed)
Palpable, s/p benign biopsy 08/2014.

## 2017-08-18 NOTE — Assessment & Plan Note (Signed)
?  hip arthritis - check xrays to eval arthritic burden. Not consistent with groin strain.

## 2017-08-18 NOTE — Assessment & Plan Note (Signed)
Overall improved. Reassuring evaluation. Anticipate resolving mastitis.

## 2017-08-18 NOTE — Telephone Encounter (Signed)
Called in refill to pharmacy 

## 2017-08-18 NOTE — Patient Instructions (Addendum)
I'm glad breast is doing better. Labs today to recheck thyroid.  You are doing well today.  R hip xray today.

## 2017-08-18 NOTE — Assessment & Plan Note (Signed)
Update labs.  

## 2017-08-18 NOTE — Progress Notes (Signed)
BP 120/62 (BP Location: Left Arm, Patient Position: Sitting, Cuff Size: Normal)   Pulse 69   Temp 97.7 F (36.5 C) (Oral)   Wt 133 lb 12 oz (60.7 kg)   SpO2 97%   BMI 24.07 kg/m    CC: 6 mo f/u visit Subjective:    Patient ID: Donna Vargas, female    DOB: [birth year], 71 y.o.   MRN: [id number]  HPI: Donna Vargas is a 71 y.o. female presenting on 08/18/2017 for 6 mo follow-up   Recent reassuring mammo/US L breast (nonspecific focal erythema) after breast skin erythema treated with keflex for possible mastitis. Skin on breast has continued to improve. Persistent slight pigment change.   We have been watching thyroid with recent TSH mildly elevated. Will recheck today. Pt denies hypothyroid symptoms (weight gain, cold intolerance, constipation). She actually has loose stools.   She continues sertraline 100mg  at bedtime and 1/2 klonopin at bedtime.   R inner groin pain over the past 1 week. Noted worse with squatting and worse as the day progresses. Denies inciting trauma/injury. She does strain while shoveling hay and horse manure. She also has lower back pain.   Relevant past medical, surgical, family and social history reviewed and updated as indicated. Interim medical history since our last visit reviewed. Allergies and medications reviewed and updated. Outpatient Medications Prior to Visit  Medication Sig Dispense Refill  . aspirin 81 MG tablet Take 81 mg by mouth daily.    . carvedilol (COREG) 6.25 MG tablet TAKE 1 TABLET BY MOUTH DAILY. 30 tablet 11  . clonazePAM (KLONOPIN) 0.5 MG tablet Take 1 tablet (0.5 mg total) by mouth at bedtime. 30 tablet 1  . cyanocobalamin (V-R VITAMIN B-12) 500 MCG tablet Take 1 tablet (500 mcg total) by mouth daily.    . diphenhydrAMINE (BENADRYL) 25 mg capsule Take 25 mg by mouth at bedtime.    Marland Kitchen EPIPEN 2-PAK 0.3 MG/0.3ML SOAJ injection   1  . levocetirizine (XYZAL) 5 MG tablet Take 5 mg by mouth at bedtime.    . montelukast  (SINGULAIR) 10 MG tablet TAKE 1 TABLET BY MOUTH AT BEDTIME. 30 tablet 11  . sertraline (ZOLOFT) 100 MG tablet Take 1 tablet (100 mg total) by mouth daily. 30 tablet 8  . cephALEXin (KEFLEX) 500 MG capsule Take 1 capsule (500 mg total) by mouth 3 (three) times daily. 21 capsule 0  . hydrocortisone 2.5 % cream Apply topically 2 (two) times daily. 30 g 0   No facility-administered medications prior to visit.      Per HPI unless specifically indicated in ROS section below Review of Systems     Objective:    BP 120/62 (BP Location: Left Arm, Patient Position: Sitting, Cuff Size: Normal)   Pulse 69   Temp 97.7 F (36.5 C) (Oral)   Wt 133 lb 12 oz (60.7 kg)   SpO2 97%   BMI 24.07 kg/m   Wt Readings from Last 3 Encounters:  08/18/17 133 lb 12 oz (60.7 kg)  07/09/17 134 lb 6.4 oz (61 kg)  03/10/17 129 lb 12 oz (58.9 kg)    Physical Exam  Constitutional: She is oriented to person, place, and time. She appears well-developed and well-nourished. No distress.  Neck: No thyromegaly (R nodule palpable) present.  Pulmonary/Chest: Right breast exhibits no inverted nipple, no mass, no nipple discharge, no skin change and no tenderness. Left breast exhibits skin change (slight postinflammatory thickening inferior to nipple). Left breast exhibits no inverted nipple,  no mass, no nipple discharge and no tenderness.  Musculoskeletal: She exhibits no edema.  L leg WNL Neg SLR bilaterally. Mild discomfort at inner groin with internal rotation of R hip.  No pain at SIJ, GTB or sciatic notch bilaterally.  No pain with testing of hip flexors/extenders/abductors against resistance  Lymphadenopathy:    She has no axillary adenopathy.       Right axillary: No lateral adenopathy present.       Left axillary: No lateral adenopathy present.      Right: No supraclavicular adenopathy present.       Left: No supraclavicular adenopathy present.  Neurological: She is alert and oriented to person, place, and  time.  Skin: Skin is warm and dry. No rash noted.  Psychiatric: She has a normal mood and affect.  Nursing note and vitals reviewed.      Assessment & Plan:   Problem List Items Addressed This Visit    Elevated TSH    Update labs.       Relevant Orders   TSH   T4, free   Lesion of skin of breast    Overall improved. Reassuring evaluation. Anticipate resolving mastitis.       Right groin pain - Primary    ?hip arthritis - check xrays to eval arthritic burden. Not consistent with groin strain.       Relevant Orders   DG HIP UNILAT WITH PELVIS 2-3 VIEWS RIGHT   Right thyroid nodule    Palpable, s/p benign biopsy 08/2014.           Follow up plan: Return if symptoms worsen or fail to improve.  [name], MD

## 2017-08-24 DIAGNOSIS — J3089 Other allergic rhinitis: Secondary | ICD-10-CM | POA: Diagnosis not present

## 2017-09-02 DIAGNOSIS — J3089 Other allergic rhinitis: Secondary | ICD-10-CM | POA: Diagnosis not present

## 2017-09-07 DIAGNOSIS — J3089 Other allergic rhinitis: Secondary | ICD-10-CM | POA: Diagnosis not present

## 2017-09-14 DIAGNOSIS — J3089 Other allergic rhinitis: Secondary | ICD-10-CM | POA: Diagnosis not present

## 2017-09-16 DIAGNOSIS — J3089 Other allergic rhinitis: Secondary | ICD-10-CM | POA: Diagnosis not present

## 2017-09-28 DIAGNOSIS — J3089 Other allergic rhinitis: Secondary | ICD-10-CM | POA: Diagnosis not present

## 2017-10-05 DIAGNOSIS — J3089 Other allergic rhinitis: Secondary | ICD-10-CM | POA: Diagnosis not present

## 2017-10-12 DIAGNOSIS — J3089 Other allergic rhinitis: Secondary | ICD-10-CM | POA: Diagnosis not present

## 2017-10-19 DIAGNOSIS — J3089 Other allergic rhinitis: Secondary | ICD-10-CM | POA: Diagnosis not present

## 2017-10-28 DIAGNOSIS — J3089 Other allergic rhinitis: Secondary | ICD-10-CM | POA: Diagnosis not present

## 2017-11-11 DIAGNOSIS — J3089 Other allergic rhinitis: Secondary | ICD-10-CM | POA: Diagnosis not present

## 2017-11-16 ENCOUNTER — Other Ambulatory Visit: Payer: Self-pay

## 2017-11-16 DIAGNOSIS — J3089 Other allergic rhinitis: Secondary | ICD-10-CM | POA: Diagnosis not present

## 2017-11-16 MED ORDER — CLONAZEPAM 0.5 MG PO TABS: 0.5000 mg | ORAL_TABLET | Freq: Every day | ORAL | 1 refills | Status: DC

## 2017-11-16 NOTE — Telephone Encounter (Signed)
Last filled:  10/14/17, #30 Last OV:  08/18/17 Next OV:  03/18/18

## 2017-11-16 NOTE — Telephone Encounter (Signed)
Sent electronically 

## 2017-12-02 DIAGNOSIS — J3089 Other allergic rhinitis: Secondary | ICD-10-CM | POA: Diagnosis not present

## 2017-12-06 ENCOUNTER — Ambulatory Visit (INDEPENDENT_AMBULATORY_CARE_PROVIDER_SITE_OTHER): Payer: Medicare Other | Admitting: Family Medicine

## 2017-12-06 ENCOUNTER — Encounter: Payer: Self-pay | Admitting: Family Medicine

## 2017-12-06 VITALS — BP 126/70 | HR 77 | Temp 98.4°F | Wt 141.0 lb

## 2017-12-06 DIAGNOSIS — M7712 Lateral epicondylitis, left elbow: Secondary | ICD-10-CM | POA: Diagnosis not present

## 2017-12-06 DIAGNOSIS — R7303 Prediabetes: Secondary | ICD-10-CM

## 2017-12-06 DIAGNOSIS — E038 Other specified hypothyroidism: Secondary | ICD-10-CM

## 2017-12-06 DIAGNOSIS — E039 Hypothyroidism, unspecified: Secondary | ICD-10-CM | POA: Diagnosis not present

## 2017-12-06 DIAGNOSIS — R140 Abdominal distension (gaseous): Secondary | ICD-10-CM | POA: Insufficient documentation

## 2017-12-06 DIAGNOSIS — I10 Essential (primary) hypertension: Secondary | ICD-10-CM | POA: Diagnosis not present

## 2017-12-06 LAB — HEMOGLOBIN A1C: HEMOGLOBIN A1C: 6 % (ref 4.6–6.5)

## 2017-12-06 LAB — TSH: TSH: 4.93 u[IU]/mL — ABNORMAL HIGH (ref 0.35–4.50)

## 2017-12-06 LAB — T4, FREE: Free T4: 0.56 ng/dL — ABNORMAL LOW (ref 0.60–1.60)

## 2017-12-06 MED ORDER — OMEPRAZOLE 40 MG PO CPDR: 40.0000 mg | DELAYED_RELEASE_CAPSULE | Freq: Every day | ORAL | 3 refills | Status: DC

## 2017-12-06 NOTE — Assessment & Plan Note (Signed)
She has taken herself off carvedilol (was previously taking 6.25mg  once daily) will monitor for now as she feels better off medication.

## 2017-12-06 NOTE — Assessment & Plan Note (Signed)
Exam most consistent with this. Supportive care reviewed, handout provided. Pt declines tennis strap today. Encouraged rest, exercises.

## 2017-12-06 NOTE — Assessment & Plan Note (Signed)
Continue to encourage limiting sodas

## 2017-12-06 NOTE — Assessment & Plan Note (Signed)
Benign abdominal exam, no red flags.  Anticipate diarrhea predominant IBS with GERD.   Will start omeprazole, start probiotic, avoid gas producing foods.  Update with effect.

## 2017-12-06 NOTE — Assessment & Plan Note (Addendum)
Update labs, consider thyroid replacement. Pt concerned about noted weight gain.

## 2017-12-06 NOTE — Progress Notes (Signed)
BP 126/70 (BP Location: Left Arm, Patient Position: Sitting, Cuff Size: Normal)   Pulse 77   Temp 98.4 F (36.9 C) (Oral)   Wt 141 lb (64 kg)   SpO2 97%   BMI 25.38 kg/m    CC: indigestion, elbow pain Subjective:    Patient ID: Donna Vargas, female    DOB: 1946-03-31, 72 y.o.   MRN: [id number]  HPI: Donna Vargas is a 72 y.o. female presenting on 12/06/2017 for Indigestion (Started about 4 wks ago and happens daily.  Also, feels bloated, belches and passing gas. Also, has to have a BM a few mins after a meal, sometimes diarrhea.) and Elbow Pain (Left elbow.  Started about 3 wks ago.  Painful when lifting items)   Prefers to go by Donna Vargas.  She self stopped carvedilol. She was not tolerating low blood pressures. Feels better off medication.   L elbow pain started 3 weeks ago "I think it's tendonitis". Tender at lateral elbow. Denies repetitive hand work. Painful to pick up anything heavy.  R handed.  4 wk h/o worsening indigestion. Describes acid reflux and increased bloating, belching, increased gassiness, stool urgency after meals. No diet changes. She does drink plenty of sodas. Loose stools. No triggering foods found. No abdominal pain or cramping. No fevers/chills, vomiting, constipation. Weight gain noted. No early satiety, dysphagia. No blood in stool.   H/o EGD 2009 - gastritis Amedeo Plenty).  Trouble fully emptying bladder - would like to discuss this at future visit.  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 Husband wants her to give up animals (cows, donkey).   COLONOSCOPY 10/2008 WNL rpt 10 yrs Amedeo Plenty)   Relevant past medical, surgical, family and social history reviewed and updated as indicated. Interim medical history since our last visit reviewed. Allergies and medications reviewed and updated. Outpatient Medications Prior to Visit  Medication Sig Dispense Refill  . aspirin 81 MG tablet Take 81 mg by  mouth daily.    . clonazePAM (KLONOPIN) 0.5 MG tablet Take 1 tablet (0.5 mg total) by mouth at bedtime. 30 tablet 1  . cyanocobalamin (V-R VITAMIN B-12) 500 MCG tablet Take 1 tablet (500 mcg total) by mouth daily.    . diphenhydrAMINE (BENADRYL) 25 mg capsule Take 25 mg by mouth at bedtime.    Marland Kitchen EPIPEN 2-PAK 0.3 MG/0.3ML SOAJ injection   1  . levocetirizine (XYZAL) 5 MG tablet Take 5 mg by mouth at bedtime.    . montelukast (SINGULAIR) 10 MG tablet TAKE 1 TABLET BY MOUTH AT BEDTIME. 30 tablet 11  . sertraline (ZOLOFT) 100 MG tablet Take 1 tablet (100 mg total) by mouth daily. 30 tablet 8  . carvedilol (COREG) 6.25 MG tablet TAKE 1 TABLET BY MOUTH DAILY. (Patient not taking: Reported on 12/06/2017) 30 tablet 11   No facility-administered medications prior to visit.      Per HPI unless specifically indicated in ROS section below Review of Systems     Objective:    BP 126/70 (BP Location: Left Arm, Patient Position: Sitting, Cuff Size: Normal)   Pulse 77   Temp 98.4 F (36.9 C) (Oral)   Wt 141 lb (64 kg)   SpO2 97%   BMI 25.38 kg/m   Wt Readings from Last 3 Encounters:  12/06/17 141 lb (64 kg)  08/18/17 133 lb 12 oz (60.7 kg)  07/09/17 134 lb 6.4 oz (61 kg)    Physical Exam  Constitutional:  She appears well-developed and well-nourished. No distress.  HENT:  Mouth/Throat: Oropharynx is clear and moist. No oropharyngeal exudate.  Eyes: Conjunctivae are normal. Pupils are equal, round, and reactive to light. No scleral icterus.  Neck: Normal range of motion. Neck supple. No thyromegaly present.  Cardiovascular: Normal rate, regular rhythm, normal heart sounds and intact distal pulses.  No murmur heard. Pulmonary/Chest: Effort normal and breath sounds normal. No respiratory distress. She has no wheezes. She has no rales.  Abdominal: Soft. Normal appearance and bowel sounds are normal. She exhibits no distension and no mass. There is no hepatosplenomegaly. There is tenderness (mild) in  the epigastric area. There is no rebound, no guarding and no CVA tenderness.  Musculoskeletal: She exhibits no edema.  FROM L wrist 2+ DP bilat Tender to palpation of L lateral epicondyle, tender with forced pronation against resistance  Lymphadenopathy:    She has no cervical adenopathy.  Skin: Skin is warm and dry. No rash noted. No erythema.  Psychiatric: She has a normal mood and affect.  Nursing note and vitals reviewed.  Results for orders placed or performed in visit on 08/18/17  TSH  Result Value Ref Range   TSH 4.60 (H) 0.35 - 4.50 uIU/mL  T4, free  Result Value Ref Range   Free T4 0.74 0.60 - 1.60 ng/dL      Assessment & Plan:   Problem List Items Addressed This Visit    Abdominal bloating - Primary    Benign abdominal exam, no red flags.  Anticipate diarrhea predominant IBS with GERD.   Will start omeprazole, start probiotic, avoid gas producing foods.  Update with effect.       Hypertension    She has taken herself off carvedilol (was previously taking 6.25mg  once daily) will monitor for now as she feels better off medication.       Left lateral epicondylitis    Exam most consistent with this. Supportive care reviewed, handout provided. Pt declines tennis strap today. Encouraged rest, exercises.       Prediabetes    Continue to encourage limiting sodas      Relevant Orders   Hemoglobin A1c   Subclinical hypothyroidism    Update labs, consider thyroid replacement. Pt concerned about noted weight gain.       Relevant Orders   TSH   T4, free       Follow up plan: No Follow-up on file.  [name], MD

## 2017-12-06 NOTE — Patient Instructions (Addendum)
Let's check your thyroid today.  We will watch blood pressure off medication.  For bloating and GI upset symptoms - I think you have combination of GERD (reflux) and irritable bowel syndrome.  Increase fiber and water Exclude gas producing foods (beans, onions, celery, carrots, raisins, bananas, apricots, prunes, brussel sprouts, wheat germ, pretzels) Consider trail of lactose free diet (back off milk) Trial of probiotic - align - for bloating/IBS symptoms (OTC). Let us know if not improving with this.  You have left tennis elbow - rest arm, do exercises provided today, and let us know if not improving with treatment.

## 2017-12-08 ENCOUNTER — Other Ambulatory Visit: Payer: Self-pay | Admitting: Family Medicine

## 2017-12-08 MED ORDER — LEVOTHYROXINE SODIUM 25 MCG PO TABS: 25.0000 ug | ORAL_TABLET | Freq: Every day | ORAL | 6 refills | Status: DC

## 2017-12-14 DIAGNOSIS — J3089 Other allergic rhinitis: Secondary | ICD-10-CM | POA: Diagnosis not present

## 2017-12-21 DIAGNOSIS — J3089 Other allergic rhinitis: Secondary | ICD-10-CM | POA: Diagnosis not present

## 2017-12-30 DIAGNOSIS — J3089 Other allergic rhinitis: Secondary | ICD-10-CM | POA: Diagnosis not present

## 2018-01-04 DIAGNOSIS — J3089 Other allergic rhinitis: Secondary | ICD-10-CM | POA: Diagnosis not present

## 2018-01-11 DIAGNOSIS — J3089 Other allergic rhinitis: Secondary | ICD-10-CM | POA: Diagnosis not present

## 2018-01-14 ENCOUNTER — Other Ambulatory Visit: Payer: Self-pay | Admitting: Family Medicine

## 2018-01-17 ENCOUNTER — Other Ambulatory Visit: Payer: Self-pay | Admitting: Family Medicine

## 2018-01-17 NOTE — Telephone Encounter (Signed)
Last filled:  12/15/17, #30 Last OV:  12/06/17 Next OV:  03/18/18

## 2018-01-18 NOTE — Telephone Encounter (Signed)
Eprescribed.

## 2018-01-28 DIAGNOSIS — J3089 Other allergic rhinitis: Secondary | ICD-10-CM | POA: Diagnosis not present

## 2018-02-15 DIAGNOSIS — J3089 Other allergic rhinitis: Secondary | ICD-10-CM | POA: Diagnosis not present

## 2018-02-24 DIAGNOSIS — J3089 Other allergic rhinitis: Secondary | ICD-10-CM | POA: Diagnosis not present

## 2018-03-08 DIAGNOSIS — J3089 Other allergic rhinitis: Secondary | ICD-10-CM | POA: Diagnosis not present

## 2018-03-11 ENCOUNTER — Ambulatory Visit: Payer: PRIVATE HEALTH INSURANCE

## 2018-03-17 ENCOUNTER — Other Ambulatory Visit: Payer: Self-pay | Admitting: Family Medicine

## 2018-03-17 ENCOUNTER — Ambulatory Visit (INDEPENDENT_AMBULATORY_CARE_PROVIDER_SITE_OTHER): Payer: Medicare Other

## 2018-03-17 VITALS — BP 112/80 | HR 66 | Temp 98.1°F | Ht 62.5 in | Wt 141.0 lb

## 2018-03-17 DIAGNOSIS — I10 Essential (primary) hypertension: Secondary | ICD-10-CM

## 2018-03-17 DIAGNOSIS — E038 Other specified hypothyroidism: Secondary | ICD-10-CM

## 2018-03-17 DIAGNOSIS — R7303 Prediabetes: Secondary | ICD-10-CM

## 2018-03-17 DIAGNOSIS — E041 Nontoxic single thyroid nodule: Secondary | ICD-10-CM

## 2018-03-17 DIAGNOSIS — Z23 Encounter for immunization: Secondary | ICD-10-CM

## 2018-03-17 DIAGNOSIS — E785 Hyperlipidemia, unspecified: Secondary | ICD-10-CM

## 2018-03-17 DIAGNOSIS — E039 Hypothyroidism, unspecified: Secondary | ICD-10-CM

## 2018-03-17 DIAGNOSIS — Z0000 Encounter for general adult medical examination without abnormal findings: Secondary | ICD-10-CM

## 2018-03-17 LAB — HEMOGLOBIN A1C: Hgb A1c MFr Bld: 6.3 % (ref 4.6–6.5)

## 2018-03-17 LAB — COMPREHENSIVE METABOLIC PANEL
ALT: 13 U/L (ref 0–35)
AST: 19 U/L (ref 0–37)
Albumin: 4 g/dL (ref 3.5–5.2)
Alkaline Phosphatase: 72 U/L (ref 39–117)
BILIRUBIN TOTAL: 0.4 mg/dL (ref 0.2–1.2)
BUN: 20 mg/dL (ref 6–23)
CALCIUM: 9.5 mg/dL (ref 8.4–10.5)
CO2: 28 mEq/L (ref 19–32)
Chloride: 105 mEq/L (ref 96–112)
Creatinine, Ser: 0.85 mg/dL (ref 0.40–1.20)
GFR: 70.38 mL/min (ref >60.00)
GLUCOSE: 100 mg/dL — AB (ref 70–99)
POTASSIUM: 4.5 meq/L (ref 3.5–5.1)
Sodium: 139 mEq/L (ref 135–145)
Total Protein: 7.4 g/dL (ref 6.0–8.3)

## 2018-03-17 LAB — LIPID PANEL
CHOL/HDL RATIO: 4
Cholesterol: 241 mg/dL — ABNORMAL HIGH (ref 0–200)
HDL: 56 mg/dL (ref >39.00)
LDL Cholesterol: 166 mg/dL — ABNORMAL HIGH (ref 0–99)
NonHDL: 184.79
TRIGLYCERIDES: 95 mg/dL (ref 0.0–149.0)
VLDL: 19 mg/dL (ref 0.0–40.0)

## 2018-03-17 LAB — T4, FREE: FREE T4: 0.59 ng/dL — AB (ref 0.60–1.60)

## 2018-03-17 LAB — TSH: TSH: 4.66 u[IU]/mL — AB (ref 0.35–4.50)
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
## 2018-03-17 NOTE — Patient Instructions (Signed)
Donna Vargas , Thank you for taking time to come for your Medicare Wellness Visit. I appreciate your ongoing commitment to your health goals. Please review the following plan we discussed and let me know if I can assist you in the future.   These are the goals we discussed: Goals    . 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        This is a list of the screening recommended for you and due dates:  Health Maintenance  Topic Date Due  . Flu Shot  06/30/2018  . Colon Cancer Screening  11/07/2018  . Mammogram  07/23/2019  . DTaP/Tdap/Td vaccine (2 - Td) 06/30/2024  . Tetanus Vaccine  06/30/2024  . DEXA scan (bone density measurement)  Completed  .  Hepatitis C: One time screening is recommended by Center for Disease Control  (CDC) for  adults born from [birth year] through 1963.   Completed  . Pneumonia vaccines  Completed   Preventive Care for Adults  A healthy lifestyle and preventive care can promote health and wellness. Preventive health guidelines for adults include the following key practices.  . A routine yearly physical is a good way to check with your health care provider about your health and preventive screening. It is a chance to share any concerns and updates on your health and to receive a thorough exam.  . Visit your dentist for a routine exam and preventive care every 6 months. Brush your teeth twice a day and floss once a day. Good oral hygiene prevents tooth decay and gum disease.  . The frequency of eye exams is based on your age, health, family medical history, use  of contact lenses, and other factors. Follow your health care provider's recommendations for frequency of eye exams.  . Eat a healthy diet. Foods like vegetables, fruits, whole grains, low-fat dairy products, and lean protein foods contain the nutrients you need without too many calories. Decrease your intake of foods high in  solid fats, added sugars, and salt. Eat the right amount of calories for you. Get information about a proper diet from your health care provider, if necessary.  . Regular physical exercise is one of the most important things you can do for your health. Most adults should get at least 150 minutes of moderate-intensity exercise (any activity that increases your heart rate and causes you to sweat) each week. In addition, most adults need muscle-strengthening exercises on 2 or more days a week.  Silver Sneakers may be a benefit available to you. To determine eligibility, you may visit the website: www.silversneakers.com or contact program at 5180663321 Mon-Fri between 8AM-8PM.   . Maintain a healthy weight. The body mass index (BMI) is a screening tool to identify possible weight problems. It provides an estimate of body fat based on height and weight. Your health care provider can find your BMI and can help you achieve or maintain a healthy weight.   For adults 20 years and older: ? A BMI below 18.5 is considered underweight. ? A BMI of 18.5 to 24.9 is normal. ? A BMI of 25 to 29.9 is considered overweight. ? A BMI of 30 and above is considered obese.   . Maintain normal blood lipids and cholesterol levels by exercising and minimizing your intake of saturated fat. Eat a balanced diet with plenty of fruit and vegetables. Blood tests for lipids and  cholesterol should begin at age 60 and be repeated every 5 years. If your lipid or cholesterol levels are high, you are over 50, or you are at high risk for heart disease, you may need your cholesterol levels checked more frequently. Ongoing high lipid and cholesterol levels should be treated with medicines if diet and exercise are not working.  . If you smoke, find out from your health care provider how to quit. If you do not use tobacco, please do not start.  . If you choose to drink alcohol, please do not consume more than 2 drinks per day. One drink  is considered to be 12 ounces (355 mL) of beer, 5 ounces (148 mL) of wine, or 1.5 ounces (44 mL) of liquor.  . If you are 40-45 years old, ask your health care provider if you should take aspirin to prevent strokes.  . Use sunscreen. Apply sunscreen liberally and repeatedly throughout the day. You should seek shade when your shadow is shorter than you. Protect yourself by wearing long sleeves, pants, a wide-brimmed hat, and sunglasses year round, whenever you are outdoors.  . Once a month, do a whole body skin exam, using a mirror to look at the skin on your back. Tell your health care provider of new moles, moles that have irregular borders, moles that are larger than a pencil eraser, or moles that have changed in shape or color.

## 2018-03-18 ENCOUNTER — Encounter: Payer: PRIVATE HEALTH INSURANCE | Admitting: Family Medicine

## 2018-03-21 NOTE — Progress Notes (Signed)
I reviewed health advisor's note, was available for consultation, and agree with documentation and plan.  

## 2018-03-22 ENCOUNTER — Ambulatory Visit (INDEPENDENT_AMBULATORY_CARE_PROVIDER_SITE_OTHER): Payer: Medicare Other | Admitting: Family Medicine

## 2018-03-22 ENCOUNTER — Encounter: Payer: Self-pay | Admitting: Family Medicine

## 2018-03-22 VITALS — BP 120/68 | HR 77 | Temp 98.2°F | Ht 63.0 in | Wt 142.0 lb

## 2018-03-22 DIAGNOSIS — E041 Nontoxic single thyroid nodule: Secondary | ICD-10-CM

## 2018-03-22 DIAGNOSIS — R7303 Prediabetes: Secondary | ICD-10-CM | POA: Diagnosis not present

## 2018-03-22 DIAGNOSIS — E785 Hyperlipidemia, unspecified: Secondary | ICD-10-CM

## 2018-03-22 DIAGNOSIS — F331 Major depressive disorder, recurrent, moderate: Secondary | ICD-10-CM | POA: Diagnosis not present

## 2018-03-22 DIAGNOSIS — E039 Hypothyroidism, unspecified: Secondary | ICD-10-CM | POA: Diagnosis not present

## 2018-03-22 DIAGNOSIS — I10 Essential (primary) hypertension: Secondary | ICD-10-CM | POA: Diagnosis not present

## 2018-03-22 MED ORDER — LEVOTHYROXINE SODIUM 50 MCG PO TABS: 50.0000 ug | ORAL_TABLET | Freq: Every day | ORAL | 3 refills | Status: DC

## 2018-03-22 NOTE — Assessment & Plan Note (Signed)
Chronic, stable on current regimen of sertraline and klonopin.

## 2018-03-22 NOTE — Assessment & Plan Note (Addendum)
Chronic, stable off meds. Will resolve.

## 2018-03-22 NOTE — Progress Notes (Signed)
BP 120/68 (BP Location: Left Arm, Patient Position: Sitting, Cuff Size: Normal)   Pulse 77   Temp 98.2 F (36.8 C) (Oral)   Ht 5\' 3"  (1.6 m)   Wt 142 lb (64.4 kg)   SpO2 97%   BMI 25.15 kg/m    CC: AMW f/u visit Subjective:    Patient ID: Donna Vargas, female    DOB: 1946/08/29, 72 y.o.   MRN: [id number]  HPI: Donna Vargas is a 72 y.o. female presenting on 03/22/2018 for Annual Exam (Pt 2.)   Saw Katha Cabal last week for medicare wellness visit. Note reviewed.  She has returned to whole klonopin 0.5mg  at bedtime.  New spot L posterior lateral tongue noted today, tender. Advised to monitor this and let me know if persisting past 1-2 wks.  Preventative: COLONOSCOPY 10/2008 WNL rpt 10 yrs Amedeo Plenty). Due for repeat.  TOTAL ABDOMINAL HYSTERECTOMY Date: 1995 both ovaries removed. CA125 level elevated - but no cancer. Pap smear 2014 - continued despite hysterectomy. Always normal. We have stopped cervical cancer screening.  Breast cancer screening - normal mammo/US 06/2017. Breast exams at home.  DEXA - had one and normal per patient. Thinks ~2013. Declines recheck for now.  Flu shot declines Prevnar 01/2017, pneumovax 02/2018 Tdap 06/2014 Shingrix - declines. Has had shingles.  Advanced directive: living will scanned 01/2015. Working on [employer] form. Would want husband to be HCPOA. Has not filled out yet.  Seat belt use discussed.  Sunscreen use discussed. No changing moles on skin. Sees derm regularly.  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 Interim medical history since our last visit reviewed. Allergies and medications reviewed  and updated. Outpatient Medications Prior to Visit  Medication Sig Dispense Refill  . aspirin 81 MG tablet Take 81 mg by mouth daily.    . clonazePAM (KLONOPIN) 0.5 MG tablet TAKE 1 TABLET BY MOUTH AT BEDTIME 30 tablet 1  . cyanocobalamin (V-R VITAMIN B-12) 500 MCG tablet Take 1 tablet (500 mcg total) by mouth daily.    . diphenhydrAMINE (BENADRYL) 25 mg capsule Take 25 mg by mouth at bedtime.    Marland Kitchen EPIPEN 2-PAK 0.3 MG/0.3ML SOAJ injection   1  . omeprazole (PRILOSEC) 40 MG capsule Take 1 capsule (40 mg total) by mouth daily. Take daily for 2 weeks then as needed (for GERD) 30 capsule 3  . sertraline (ZOLOFT) 100 MG tablet TAKE 1 TABLET BY MOUTH DAILY. 30 tablet 2  . levothyroxine (SYNTHROID, LEVOTHROID) 25 MCG tablet Take 1 tablet (25 mcg total) by mouth daily before breakfast. 30 tablet 6   No facility-administered medications prior to visit.      Per HPI unless specifically indicated in ROS section below Review of Systems     Objective:    BP 120/68 (BP Location: Left Arm, Patient Position: Sitting, Cuff Size: Normal)   Pulse 77   Temp 98.2 F (36.8 C) (Oral)   Ht 5\' 3"  (1.6 m)   Wt 142 lb (64.4 kg)   SpO2 97%   BMI 25.15 kg/m   Wt Readings from Last 3 Encounters:  03/22/18 142 lb (64.4 kg)  03/17/18 141 lb (64 kg)  12/06/17 141 lb (  64 kg)    Physical Exam  Constitutional: She is oriented to person, place, and time. She appears well-developed and well-nourished. No distress.  HENT:  Head: Normocephalic and atraumatic.  Right Ear: Hearing, tympanic membrane, external ear and ear canal normal.  Left Ear: Hearing, tympanic membrane, external ear and ear canal normal.  Nose: Nose normal.  Mouth/Throat: Uvula is midline, oropharynx is clear and moist and mucous membranes are normal. No oropharyngeal exudate, posterior oropharyngeal edema or posterior oropharyngeal erythema.  Eyes: Pupils are equal, round, and reactive to light. Conjunctivae and EOM are normal. No scleral  icterus.  Neck: Normal range of motion. Neck supple. No thyromegaly present.  Cardiovascular: Normal rate, regular rhythm, normal heart sounds and intact distal pulses.  No murmur heard. Pulses:      Radial pulses are 2+ on the right side, and 2+ on the left side.  Pulmonary/Chest: Effort normal and breath sounds normal. No respiratory distress. She has no wheezes. She has no rales.  Abdominal: Soft. Bowel sounds are normal. She exhibits no distension and no mass. There is no tenderness. There is no rebound and no guarding.  Musculoskeletal: Normal range of motion. She exhibits no edema.  Lymphadenopathy:    She has no cervical adenopathy.  Neurological: She is alert and oriented to person, place, and time.  CN grossly intact, station and gait intact  Skin: Skin is warm and dry. No rash noted.  Psychiatric: She has a normal mood and affect. Her behavior is normal. Judgment and thought content normal.  Nursing note and vitals reviewed.  Results for orders placed or performed in visit on 03/17/18  T4, free  Result Value Ref Range   Free T4 0.59 (L) 0.60 - 1.60 ng/dL  Hemoglobin A1c  Result Value Ref Range   Hgb A1c MFr Bld 6.3 4.6 - 6.5 %  TSH  Result Value Ref Range   TSH 4.66 (H) 0.35 - 4.50 uIU/mL  Comprehensive metabolic panel  Result Value Ref Range   Sodium 139 135 - 145 mEq/L   Potassium 4.5 3.5 - 5.1 mEq/L   Chloride 105 96 - 112 mEq/L   CO2 28 19 - 32 mEq/L   Glucose, Bld 100 (H) 70 - 99 mg/dL   BUN 20 6 - 23 mg/dL   Creatinine, Ser 0.85 0.40 - 1.20 mg/dL   Total Bilirubin 0.4 0.2 - 1.2 mg/dL   Alkaline Phosphatase 72 39 - 117 U/L   AST 19 0 - 37 U/L   ALT 13 0 - 35 U/L   Total Protein 7.4 6.0 - 8.3 g/dL   Albumin 4.0 3.5 - 5.2 g/dL   Calcium 9.5 8.4 - 10.5 mg/dL   GFR 70.38 >60.00 mL/min  Lipid panel  Result Value Ref Range   Cholesterol 241 (H) 0 - 200 mg/dL   Triglycerides 95.0 0.0 - 149.0 mg/dL   HDL 56.00 >39.00 mg/dL   VLDL 19.0 0.0 - 40.0 mg/dL   LDL  Cholesterol 166 (H) 0 - 99 mg/dL   Total CHOL/HDL Ratio 4    NonHDL 184.79       Assessment & Plan:   Problem List Items Addressed This Visit    HLD (hyperlipidemia) - Primary    Chronic, off meds.  The 10-year ASCVD risk score Mikey Bussing DC Brooke Bonito., et al., 2013) is: 8%   Values used to calculate the score:     Age: 89 years     Sex: Female     Is Non-Hispanic African American:  No     Diabetic: No     Tobacco smoker: No     Systolic Blood Pressure: 829 mmHg     Is BP treated: No     HDL Cholesterol: 56 mg/dL     Total Cholesterol: 241 mg/dL       RESOLVED: Hypertension    Chronic, stable off meds. Will resolve.       Hypothyroidism    TFTs remain low - will increase levothyroxine to 51mcg daily. RTC 3-4 mo lab visit TFT check.       Relevant Medications   levothyroxine (SYNTHROID, LEVOTHROID) 50 MCG tablet   MDD (major depressive disorder), recurrent episode, moderate (HCC)    Chronic, stable on current regimen of sertraline and klonopin.       Prediabetes    Encouraged limiting added sugars in diet.       Right thyroid nodule    S/p benign biopsy 2015.       Relevant Medications   levothyroxine (SYNTHROID, LEVOTHROID) 50 MCG tablet       Meds ordered this encounter  Medications  . levothyroxine (SYNTHROID, LEVOTHROID) 50 MCG tablet    Sig: Take 1 tablet (50 mcg total) by mouth daily before breakfast.    Dispense:  90 tablet    Refill:  3   No orders of the defined types were placed in this encounter.   Follow up plan: Return if symptoms worsen or fail to improve.  [name], MD

## 2018-03-22 NOTE — Assessment & Plan Note (Signed)
TFTs remain low - will increase levothyroxine to 30mcg daily. RTC 3-4 mo lab visit TFT check.

## 2018-03-22 NOTE — Patient Instructions (Addendum)
If you don't receive a letter by December 2019, call [facility] to schedule repeat colonoscopy.  Increase thyroid medicine to 12mg daily - new dose sent to pharmacy.  Watch added sugars in the diet. Cholesterol levels were a bit worse - we will continue to watch this. Remember - more fruits and vegetables, more fish, less red meat and dairy products.  More soy, nuts, beans, barley, lentils, oats and plant sterol ester enriched margarine instead of butter.  Health Maintenance, Female Adopting a healthy lifestyle and getting preventive care can go a long way to promote health and wellness. Talk with your health care provider about what schedule of regular examinations is right for you. This is a good chance for you to check in with your provider about disease prevention and staying healthy. In between checkups, there are plenty of things you can do on your own. Experts have done a lot of research about which lifestyle changes and preventive measures are most likely to keep you healthy. Ask your health care provider for more information. Weight and diet Eat a healthy diet  Be sure to include plenty of vegetables, fruits, low-fat dairy products, and lean protein.  Do not eat a lot of foods high in solid fats, added sugars, or salt.  Get regular exercise. This is one of the most important things you can do for your health. ? Most adults should exercise for at least 150 minutes each week. The exercise should increase your heart rate and make you sweat (moderate-intensity exercise). ? Most adults should also do strengthening exercises at least twice a week. This is in addition to the moderate-intensity exercise.  Maintain a healthy weight  Body mass index (BMI) is a measurement that can be used to identify possible weight problems. It estimates body fat based on height and weight. Your health care provider can help determine your BMI and help you achieve or maintain a healthy weight.  For females 221 years of age and older: ? A BMI below 18.5 is considered underweight. ? A BMI of 18.5 to 24.9 is normal. ? A BMI of 25 to 29.9 is considered overweight. ? A BMI of 30 and above is considered obese.  Watch levels of cholesterol and blood lipids  You should start having your blood tested for lipids and cholesterol at 70years of age, then have this test every 5 years.  You may need to have your cholesterol levels checked more often if: ? Your lipid or cholesterol levels are high. ? You are older than 70years of age. ? You are at high risk for heart disease.  Cancer screening Lung Cancer  Lung cancer screening is recommended for adults [id number] old who are at high risk for lung cancer because of a history of smoking.  A yearly low-dose CT scan of the lungs is recommended for people who: ? Currently smoke. ? Have quit within the past 15 years. ? Have at least a 30-pack-year history of smoking. A pack year is smoking an average of one pack of cigarettes a day for 1 year.  Yearly screening should continue until it has been 15 years since you quit.  Yearly screening should stop if you develop a health problem that would prevent you from having lung cancer treatment.  Breast Cancer  Practice breast self-awareness. This means understanding how your breasts normally appear and feel.  It also means doing regular breast self-exams. Let your health care provider know about any changes, no matter  how small.  If you are in your 20s or 30s, you should have a clinical breast exam (CBE) by a health care provider every 1-3 years as part of a regular health exam.  If you are 37 or older, have a CBE every year. Also consider having a breast X-ray (mammogram) every year.  If you have a family history of breast cancer, talk to your health care provider about genetic screening.  If you are at high risk for breast cancer, talk to your health care provider about having an MRI and a mammogram every  year.  Breast cancer gene (BRCA) assessment is recommended for women who have family members with BRCA-related cancers. BRCA-related cancers include: ? Breast. ? Ovarian. ? Tubal. ? Peritoneal cancers.  Results of the assessment will determine the need for genetic counseling and BRCA1 and BRCA2 testing.  Cervical Cancer Your health care provider may recommend that you be screened regularly for cancer of the pelvic organs (ovaries, uterus, and vagina). This screening involves a pelvic examination, including checking for microscopic changes to the surface of your cervix (Pap test). You may be encouraged to have this screening done every 3 years, beginning at age 19.  For women ages 40-65, health care providers may recommend pelvic exams and Pap testing every 3 years, or they may recommend the Pap and pelvic exam, combined with testing for human papilloma virus (HPV), every 5 years. Some types of HPV increase your risk of cervical cancer. Testing for HPV may also be done on women of any age with unclear Pap test results.  Other health care providers may not recommend any screening for nonpregnant women who are considered low risk for pelvic cancer and who do not have symptoms. Ask your health care provider if a screening pelvic exam is right for you.  If you have had past treatment for cervical cancer or a condition that could lead to cancer, you need Pap tests and screening for cancer for at least 20 years after your treatment. If Pap tests have been discontinued, your risk factors (such as having a new sexual partner) need to be reassessed to determine if screening should resume. Some women have medical problems that increase the chance of getting cervical cancer. In these cases, your health care provider may recommend more frequent screening and Pap tests.  Colorectal Cancer  This type of cancer can be detected and often prevented.  Routine colorectal cancer screening usually begins at 70  years of age and continues through 70 years of age.  Your health care provider may recommend screening at an earlier age if you have risk factors for colon cancer.  Your health care provider may also recommend using home test kits to check for hidden blood in the stool.  A small camera at the end of a tube can be used to examine your colon directly (sigmoidoscopy or colonoscopy). This is done to check for the earliest forms of colorectal cancer.  Routine screening usually begins at age 4.  Direct examination of the colon should be repeated every 5-10 years through 70 years of age. However, you may need to be screened more often if early forms of precancerous polyps or small growths are found.  Skin Cancer  Check your skin from head to toe regularly.  Tell your health care provider about any new moles or changes in moles, especially if there is a change in a mole's shape or color.  Also tell your health care provider if you have a  mole that is larger than the size of a pencil eraser.  Always use sunscreen. Apply sunscreen liberally and repeatedly throughout the day.  Protect yourself by wearing long sleeves, pants, a wide-brimmed hat, and sunglasses whenever you are outside.  Heart disease, diabetes, and high blood pressure  High blood pressure causes heart disease and increases the risk of stroke. High blood pressure is more likely to develop in: ? People who have blood pressure in the high end of the normal range (130-139/85-89 mm Hg). ? People who are overweight or obese. ? People who are African American.  If you are 48-13 years of age, have your blood pressure checked every 3-5 years. If you are 67 years of age or older, have your blood pressure checked every year. You should have your blood pressure measured twice-once when you are at a hospital or clinic, and once when you are not at a hospital or clinic. Record the average of the two measurements. To check your blood pressure  when you are not at a hospital or clinic, you can use: ? An automated blood pressure machine at a pharmacy. ? A home blood pressure monitor.  If you are between 44 years and 38 years old, ask your health care provider if you should take aspirin to prevent strokes.  Have regular diabetes screenings. This involves taking a blood sample to check your fasting blood sugar level. ? If you are at a normal weight and have a low risk for diabetes, have this test once every three years after 70 years of age. ? If you are overweight and have a high risk for diabetes, consider being tested at a younger age or more often. Preventing infection Hepatitis B  If you have a higher risk for hepatitis B, you should be screened for this virus. You are considered at high risk for hepatitis B if: ? You were born in a country where hepatitis B is common. Ask your health care provider which countries are considered high risk. ? Your parents were born in a high-risk country, and you have not been immunized against hepatitis B (hepatitis B vaccine). ? You have HIV or AIDS. ? You use needles to inject street drugs. ? You live with someone who has hepatitis B. ? You have had sex with someone who has hepatitis B. ? You get hemodialysis treatment. ? You take certain medicines for conditions, including cancer, organ transplantation, and autoimmune conditions.  Hepatitis C  Blood testing is recommended for: ? Everyone born from [birth year] through 1963. ? Anyone with known risk factors for hepatitis C.  Sexually transmitted infections (STIs)  You should be screened for sexually transmitted infections (STIs) including gonorrhea and chlamydia if: ? You are sexually active and are younger than 70 years of age. ? You are older than 70 years of age and your health care provider tells you that you are at risk for this type of infection. ? Your sexual activity has changed since you were last screened and you are at an increased  risk for chlamydia or gonorrhea. Ask your health care provider if you are at risk.  If you do not have HIV, but are at risk, it may be recommended that you take a prescription medicine daily to prevent HIV infection. This is called pre-exposure prophylaxis (PrEP). You are considered at risk if: ? You are sexually active and do not regularly use condoms or know the HIV status of your partner(s). ? You take drugs by injection. ? You  are sexually active with a partner who has HIV.  Talk with your health care provider about whether you are at high risk of being infected with HIV. If you choose to begin PrEP, you should first be tested for HIV. You should then be tested every 3 months for as long as you are taking PrEP. Pregnancy  If you are premenopausal and you may become pregnant, ask your health care provider about preconception counseling.  If you may become pregnant, take 400 to 800 micrograms (mcg) of folic acid every day.  If you want to prevent pregnancy, talk to your health care provider about birth control (contraception). Osteoporosis and menopause  Osteoporosis is a disease in which the bones lose minerals and strength with aging. This can result in serious bone fractures. Your risk for osteoporosis can be identified using a bone density scan.  If you are 19 years of age or older, or if you are at risk for osteoporosis and fractures, ask your health care provider if you should be screened.  Ask your health care provider whether you should take a calcium or vitamin D supplement to lower your risk for osteoporosis.  Menopause may have certain physical symptoms and risks.  Hormone replacement therapy may reduce some of these symptoms and risks. Talk to your health care provider about whether hormone replacement therapy is right for you. Follow these instructions at home:  Schedule regular health, dental, and eye exams.  Stay current with your immunizations.  Do not use any  tobacco products including cigarettes, chewing tobacco, or electronic cigarettes.  If you are pregnant, do not drink alcohol.  If you are breastfeeding, limit how much and how often you drink alcohol.  Limit alcohol intake to no more than 1 drink per day for nonpregnant women. One drink equals 12 ounces of beer, 5 ounces of wine, or 1 ounces of hard liquor.  Do not use street drugs.  Do not share needles.  Ask your health care provider for help if you need support or information about quitting drugs.  Tell your health care provider if you often feel depressed.  Tell your health care provider if you have ever been abused or do not feel safe at home. This information is not intended to replace advice given to you by your health care provider. Make sure you discuss any questions you have with your health care provider. Document Released: 06/01/2011 Document Revised: 04/23/2016 Document Reviewed: 08/20/2015 Elsevier Interactive Patient Education  [employer].

## 2018-03-22 NOTE — Assessment & Plan Note (Signed)
S/p benign biopsy 2015.

## 2018-03-22 NOTE — Assessment & Plan Note (Signed)
Chronic, off meds.  The 10-year ASCVD risk score Mikey Bussing DC Brooke Bonito., et al., 2013) is: 8%   Values used to calculate the score:     Age: 72 years     Sex: Female     Is Non-Hispanic African American: No     Diabetic: No     Tobacco smoker: No     Systolic Blood Pressure: 977 mmHg     Is BP treated: No     HDL Cholesterol: 56 mg/dL     Total Cholesterol: 241 mg/dL

## 2018-03-22 NOTE — Assessment & Plan Note (Signed)
Encouraged limiting added sugars in diet.  

## 2018-04-05 ENCOUNTER — Other Ambulatory Visit: Payer: Self-pay | Admitting: Family Medicine

## 2018-04-05 NOTE — Telephone Encounter (Signed)
Last filled 02-25-18 #30 Last OV 03-22-18 Next OV 04-05-19  Forwarding to Dr Damita Dunnings in Dr Synthia Innocent absence

## 2018-04-06 NOTE — Telephone Encounter (Signed)
Sent. Thanks.   

## 2018-05-06 ENCOUNTER — Other Ambulatory Visit: Payer: Self-pay | Admitting: *Deleted

## 2018-05-06 ENCOUNTER — Other Ambulatory Visit: Payer: Self-pay | Admitting: Family Medicine

## 2018-05-06 MED ORDER — CLONAZEPAM 0.5 MG PO TABS: 0.5000 mg | ORAL_TABLET | Freq: Every day | ORAL | 0 refills | Status: DC

## 2018-05-06 NOTE — Telephone Encounter (Signed)
Faxed refill request.  Name of Medication: Clonazepam Name of Pharmacy: [facility] or Written Date and Quantity:  30 tablet 0 04/06/2018  Last Office Visit and Type: 03/22/18  Hyperlipidemia Next Office Visit and Type: 07/25/18 Labs Last Controlled Substance Agreement Date: 09/08/16 Last UDS: 06/08/16

## 2018-05-06 NOTE — Telephone Encounter (Signed)
Eprescribed.

## 2018-06-14 ENCOUNTER — Other Ambulatory Visit: Payer: Self-pay | Admitting: Family Medicine

## 2018-06-14 NOTE — Telephone Encounter (Signed)
Name of Medication:  Clonazepam Name of Pharmacy: [facility] or Written Date and Quantity: 05/06/18, #30 Last Office Visit and Type: 03/22/18, f/u Next Office Visit and Type: 04/05/19, CPE Last Controlled Substance Agreement Date: 06/08/16 Last UDS: 06/08/16

## 2018-06-16 NOTE — Telephone Encounter (Signed)
Eprescribed.

## 2018-07-24 ENCOUNTER — Other Ambulatory Visit: Payer: Self-pay | Admitting: Family Medicine

## 2018-07-24 DIAGNOSIS — R7303 Prediabetes: Secondary | ICD-10-CM

## 2018-07-24 DIAGNOSIS — E039 Hypothyroidism, unspecified: Secondary | ICD-10-CM

## 2018-07-25 ENCOUNTER — Other Ambulatory Visit (INDEPENDENT_AMBULATORY_CARE_PROVIDER_SITE_OTHER): Payer: Medicare Other

## 2018-07-25 DIAGNOSIS — E039 Hypothyroidism, unspecified: Secondary | ICD-10-CM

## 2018-07-25 DIAGNOSIS — R7303 Prediabetes: Secondary | ICD-10-CM | POA: Diagnosis not present

## 2018-07-25 LAB — HEMOGLOBIN A1C: Hgb A1c MFr Bld: 6.3 % (ref 4.6–6.5)

## 2018-07-25 LAB — T4, FREE: Free T4: 0.61 ng/dL (ref 0.60–1.60)

## 2018-07-25 LAB — TSH: TSH: 3.25 u[IU]/mL (ref 0.35–4.50)

## 2018-07-27 ENCOUNTER — Other Ambulatory Visit: Payer: Self-pay | Admitting: Family Medicine

## 2018-07-27 NOTE — Telephone Encounter (Signed)
Name of Medication: Clonazepam Name of Pharmacy: [facility] or Written Date and Quantity: 06/16/18, #30 Last Office Visit and Type: 03/22/18, f/u Next Office Visit and Type: 04/05/19, CPE Last Controlled Substance Agreement Date: 06/08/16 Last UDS: 06/08/16

## 2018-07-28 NOTE — Telephone Encounter (Signed)
Eprescribed.

## 2018-08-24 ENCOUNTER — Other Ambulatory Visit: Payer: Self-pay | Admitting: Family Medicine

## 2018-08-24 DIAGNOSIS — Z1231 Encounter for screening mammogram for malignant neoplasm of breast: Secondary | ICD-10-CM

## 2018-08-29 ENCOUNTER — Other Ambulatory Visit: Payer: Self-pay | Admitting: Family Medicine

## 2018-08-29 ENCOUNTER — Ambulatory Visit
Admission: RE | Admit: 2018-08-29 | Discharge: 2018-08-29 | Disposition: A | Discharge status: Home / Self Care | Payer: Medicare Other | Source: Ambulatory Visit | Attending: Family Medicine | Admitting: Family Medicine

## 2018-08-29 DIAGNOSIS — Z1231 Encounter for screening mammogram for malignant neoplasm of breast: Secondary | ICD-10-CM | POA: Diagnosis not present

## 2018-08-29 LAB — HM MAMMOGRAPHY

## 2018-08-29 NOTE — Telephone Encounter (Signed)
Klonipin 0.5 refill Last Refill:06/2918 #  Last OV: unseen in chart when last addressed PCP: Danise Mina Pharmacy:[facility]

## 2018-08-29 NOTE — Addendum Note (Signed)
Addended by: Helene Shoe on: 08/29/2018 01:31 PM   Modules accepted: Orders

## 2018-08-29 NOTE — Telephone Encounter (Signed)
Name of Medication: klonopin 0.5 mg Name of Pharmacy: [facility] or Written Date and Quantity: # 30 on 07/28/18 Last Office Visit and Type: 03/22/18 annual Next Office Visit and Type: 04/05/2019 CPX

## 2018-08-29 NOTE — Telephone Encounter (Signed)
Copied from [place] [id number]. Topic: Quick Communication - Rx Refill/Question >> Aug 29, 2018 12:32 PM Reyne Dumas L wrote: Medication:   clonazePAM (KLONOPIN) 0.5 MG tablet   Has the patient contacted their pharmacy? No - controlled substance (Agent: If no, request that the patient contact the pharmacy for the refill.) (Agent: If yes, when and what did the pharmacy advise?)  Preferred Pharmacy (with phone number or street name): [place], [place] - [place] [phone] (Phone) [phone] (Fax)  Agent: Please be advised that RX refills may take up to 3 business days. We ask that you follow-up with your pharmacy.

## 2018-08-30 ENCOUNTER — Encounter: Payer: Self-pay | Admitting: Family Medicine

## 2018-08-30 ENCOUNTER — Other Ambulatory Visit: Payer: Self-pay | Admitting: Family Medicine

## 2018-08-30 NOTE — Telephone Encounter (Signed)
Name of Medication: Clonazepam Name of Pharmacy: [facility] or Written Date and Quantity: 07/29/18, #30 Last Office Visit and Type: 03/22/18, CPE Next Office Visit and Type: 04/05/19, CPE Last Controlled Substance Agreement Date: 06/08/16 Last UDS: 06/08/16

## 2018-08-31 NOTE — Telephone Encounter (Signed)
E prescrubed

## 2018-09-06 ENCOUNTER — Ambulatory Visit (INDEPENDENT_AMBULATORY_CARE_PROVIDER_SITE_OTHER)
Admission: RE | Admit: 2018-09-06 | Discharge: 2018-09-06 | Disposition: A | Discharge status: Home / Self Care | Payer: Medicare Other | Source: Ambulatory Visit | Attending: Family Medicine | Admitting: Family Medicine

## 2018-09-06 ENCOUNTER — Encounter: Payer: Self-pay | Admitting: Family Medicine

## 2018-09-06 ENCOUNTER — Ambulatory Visit (INDEPENDENT_AMBULATORY_CARE_PROVIDER_SITE_OTHER): Payer: Medicare Other | Admitting: Family Medicine

## 2018-09-06 VITALS — BP 156/82 | HR 67 | Temp 97.9°F | Ht 63.0 in | Wt 147.2 lb

## 2018-09-06 DIAGNOSIS — M5442 Lumbago with sciatica, left side: Secondary | ICD-10-CM | POA: Diagnosis not present

## 2018-09-06 DIAGNOSIS — G8929 Other chronic pain: Secondary | ICD-10-CM

## 2018-09-06 DIAGNOSIS — R002 Palpitations: Secondary | ICD-10-CM | POA: Diagnosis not present

## 2018-09-06 DIAGNOSIS — M5441 Lumbago with sciatica, right side: Secondary | ICD-10-CM | POA: Diagnosis not present

## 2018-09-06 DIAGNOSIS — I10 Essential (primary) hypertension: Secondary | ICD-10-CM

## 2018-09-06 DIAGNOSIS — M545 Low back pain: Secondary | ICD-10-CM | POA: Diagnosis not present

## 2018-09-06 MED ORDER — AMLODIPINE BESYLATE 5 MG PO TABS: 5.0000 mg | ORAL_TABLET | Freq: Every day | ORAL | 6 refills | Status: DC

## 2018-09-06 NOTE — Assessment & Plan Note (Signed)
Previously stable off meds, today BP elevated in office. Pt endorses home readings running 253 systolic on average. Will start amlodipine 5mg  daily. RTC 4 mo f/u visit.

## 2018-09-06 NOTE — Assessment & Plan Note (Signed)
Ongoing for months. Benign exam. Suspect spinal stenosis - check baseline lumbar films today. Rx supportively with rest, tylenol, stretching exercises provided today. Discussed PT referral vs lumbar MRI (if worsening bowel/bladder control trouble). Pt agrees with plan.

## 2018-09-06 NOTE — Patient Instructions (Addendum)
Blood pressure too high today - start amlodipine 5mg  daily in the morning. Return in 1 month for follow up visit HTN. For lower back - xray today. I wonder about spinal stenosis. Continue regular walking, do exercises provided today, continue tylenol as needed for lower back pain. Consider physical therapy course. If worsening symptoms including trouble with bowel and bladder, let me know for MRI.   Spinal Stenosis Spinal stenosis occurs when the open space (spinal canal) between the bones of your spine (vertebrae) narrows, putting pressure on the spinal cord or nerves. What are the causes? This condition is caused by areas of bone pushing into the central canals of your vertebrae. This condition may be present at birth (congenital), or it may be caused by:  Arthritic deterioration of your vertebrae (spinal degeneration). This usually starts around age 16.  Injury or trauma to the spine.  Tumors in the spine.  Calcium deposits in the spine.  What are the signs or symptoms? Symptoms of this condition include:  Pain in the neck or back that is generally worse with activities, particularly when standing and walking.  Numbness, tingling, hot or cold sensations, weakness, or weariness in your legs.  Pain going up and down the leg (sciatica).  Frequent episodes of falling.  A foot-slapping gait that leads to muscle weakness.  In more serious cases, you may develop:  Problemspassing stool or passing urine.  Difficulty having sex.  Loss of feeling in part or all of your leg.  Symptoms may come on slowly and get worse over time. How is this diagnosed? This condition is diagnosed based on your medical history and a physical exam. Tests will also be done, such as:  MRI.  CT scan.  X-ray.  How is this treated? Treatment for this condition often focuses on managing your pain and any other symptoms. Treatment may include:  Practicing good posture to lessen pressure on your  nerves.  Exercising to strengthen muscles, build endurance, improve balance, and maintain good joint movement (range of motion).  Losing weight, if needed.  Taking medicines to reduce swelling, inflammation, or pain.  Assistive devices, such as a corset or brace.  In some cases, surgery may be needed. The most common procedure is decompression laminectomy. This is done to remove excess bone that puts pressure on your nerve roots. Follow these instructions at home: Managing pain, stiffness, and swelling  Do all exercises and stretches as told by your health care provider.  Practice good posture. If you were given a brace or a corset, wear it as told by your health care provider.  Do not do any activities that cause pain. Ask your health care provider what activities are safe for you.  Do not lift anything that is heavier than 10 lb (4.5 kg) or the limit that your health care provider tells you.  Maintain a healthy weight. Talk with your health care provider if you need help losing weight.  If directed, apply heat to the affected area as often as told by your health care provider. Use the heat source that your health care provider recommends, such as a moist heat pack or a heating pad. ? Place a towel between your skin and the heat source. ? Leave the heat on for 20-30 minutes. ? Remove the heat if your skin turns bright red. This is especially important if you are not able to feel pain, heat, or cold. You may have a greater risk of getting burned. General instructions  Take  over-the-counter and prescription medicines only as told by your health care provider.  Do not use any products that contain nicotine or tobacco, such as cigarettes and e-cigarettes. If you need help quitting, ask your health care provider.  Eat a healthy diet. This includes plenty of fruits and vegetables, whole grains, and low-fat (lean) protein.  Keep all follow-up visits as told by your health care provider.  This is important. Contact a health care provider if:  Your symptoms do not get better or they get worse.  You have a fever. Get help right away if:  You have new or worse pain in your neck or upper back.  You have severe pain that cannot be controlled with medicines.  You are dizzy.  You have vision problems, blurred vision, or double vision.  You have a severe headache that is worse when you stand.  You have nausea or you vomit.  You develop new or worse numbness or tingling in your back or legs.  You have pain, redness, swelling, or warmth in your arm or leg. Summary  Spinal stenosis occurs when the open space (spinal canal) between the bones of your spine (vertebrae) narrows. This narrowing puts pressure on the spinal cord or nerves.  Spinal stenosis can cause numbness, weakness, or pain in the neck, back, and legs.  This condition may be caused by a birth defect, arthritic deterioration of your vertebrae, injury, tumors, or calcium deposits.  This condition is usually diagnosed with MRIs, CT scans, and X-rays. This information is not intended to replace advice given to you by your health care provider. Make sure you discuss any questions you have with your health care provider. Document Released: 02/06/2004 Document Revised: 10/21/2016 Document Reviewed: 10/21/2016 Elsevier Interactive Patient Education  [employer].

## 2018-09-06 NOTE — Assessment & Plan Note (Signed)
Endorses isolated episodes of malaise associated with HR 140s, that improves within several minutes. Advised to start monitoring this closely, consider cards referral if more frequent episodes. Will reassess next month with better BP control.

## 2018-09-06 NOTE — Progress Notes (Signed)
BP (!) 156/82 (BP Location: Right Arm, Patient Position: Sitting, Cuff Size: Normal)   Pulse 67   Temp 97.9 F (36.6 C) (Oral)   Ht 5\' 3"  (1.6 m)   Wt 147 lb 4 oz (66.8 kg)   SpO2 96%   BMI 26.08 kg/m   BP Readings from Last 3 Encounters:  09/06/18 (!) 156/82  03/22/18 120/68  03/17/18 112/80  bp on recheck 200/92  CC: back pain Subjective:    Patient ID: Donna Vargas, female    DOB: 1946-07-07, 72 y.o.   MRN: [id number]  HPI: Donna Vargas is a 72 y.o. female presenting on 09/06/2018 for Back Pain (C/o bilateral lower back pain radiating down into right leg. Started some months ago and has worsened, espesially the longer she is on her feet. )   About 1 year history of lower back pain with radiation down bilateral legs, worsening over last few months. "legs feel funny" like they're going numb. Worse walking on hard surfaces or with prolonged standing, worse as day progresses. Yesterday especially bad. Treating with tylenol. Denies inciting trauma/injury.   No weakness of legs, no saddle anesthesia. No fevers/chills. Chronic stress/urge incontinence symptoms with leaking upon awakening, worse over last 1-2 months. More trouble controlling both bladder and bowel recently.   She has inversion table at home but hasn't used this.  No h/o back problems.  Also endorses few episodes of malaise upon awakening associated with HR 140s, BP was normotensive. No chest pain with this, headaches or dyspnea or dizziness. This has happened about once a month for the past 2 months.   Weight gain noted.   Relevant past medical, surgical, family and social history reviewed and updated as indicated. Interim medical history since our last visit reviewed. Allergies and medications reviewed and updated. Outpatient Medications Prior to Visit  Medication Sig Dispense Refill  . aspirin 81 MG tablet Take 81 mg by mouth daily.    . clonazePAM (KLONOPIN) 0.5 MG tablet TAKE 1 TABLET BY  MOUTH AT BEDTIME. 30 tablet 0  . cyanocobalamin (V-R VITAMIN B-12) 500 MCG tablet Take 1 tablet (500 mcg total) by mouth daily.    . diphenhydrAMINE (BENADRYL) 25 mg capsule Take 25 mg by mouth at bedtime.    Marland Kitchen EPIPEN 2-PAK 0.3 MG/0.3ML SOAJ injection   1  . levothyroxine (SYNTHROID, LEVOTHROID) 50 MCG tablet Take 1 tablet (50 mcg total) by mouth daily before breakfast. 90 tablet 3  . omeprazole (PRILOSEC) 40 MG capsule Take 1 capsule (40 mg total) by mouth daily. Take daily for 2 weeks then as needed (for GERD) 30 capsule 3  . sertraline (ZOLOFT) 100 MG tablet TAKE 1 TABLET BY MOUTH DAILY. 30 tablet 4   No facility-administered medications prior to visit.      Per HPI unless specifically indicated in ROS section below Review of Systems     Objective:    BP (!) 156/82 (BP Location: Right Arm, Patient Position: Sitting, Cuff Size: Normal)   Pulse 67   Temp 97.9 F (36.6 C) (Oral)   Ht 5\' 3"  (1.6 m)   Wt 147 lb 4 oz (66.8 kg)   SpO2 96%   BMI 26.08 kg/m   Wt Readings from Last 3 Encounters:  09/06/18 147 lb 4 oz (66.8 kg)  03/22/18 142 lb (64.4 kg)  03/17/18 141 lb (64 kg)    Physical Exam  Constitutional: She is oriented to person, place, and time. She appears well-developed and well-nourished. No distress.  HENT:  Mouth/Throat: Oropharynx is clear and moist. No oropharyngeal exudate.  Cardiovascular: Normal rate, regular rhythm and normal heart sounds.  No murmur heard. Pulmonary/Chest: Effort normal and breath sounds normal. No respiratory distress. She has no wheezes. She has no rales.  Musculoskeletal: She exhibits no edema.  No pain midline spine No paraspinous mm tenderness Neg SLR bilaterally. No pain with int/ext rotation at hip. Neg FABER. No pain at SIJ, GTB or sciatic notch bilaterally.   Neurological: She is alert and oriented to person, place, and time. She has normal strength. No sensory deficit. Coordination and gait normal.  Reflex Scores:      Patellar  reflexes are 2+ on the right side and 2+ on the left side.      Achilles reflexes are 2+ on the right side and 2+ on the left side. 5/5 strength BLE Able to heel and toe walk  Skin: Skin is warm and dry. No rash noted.  Psychiatric: She has a normal mood and affect.  Nursing note and vitals reviewed.   Lab Results  Component Value Date   TSH 3.25 07/25/2018    Lab Results  Component Value Date   WBC 6.2 01/30/2016   HGB 13.2 01/30/2016   HCT 39.5 01/30/2016   MCV 87.6 01/30/2016   PLT 219.0 01/30/2016    Lab Results  Component Value Date   CREATININE 0.85 03/17/2018   Vargas 20 03/17/2018   NA 139 03/17/2018   K 4.5 03/17/2018   CL 105 03/17/2018   CO2 28 03/17/2018       Assessment & Plan:   Problem List Items Addressed This Visit    Palpitations    Endorses isolated episodes of malaise associated with HR 140s, that improves within several minutes. Advised to start monitoring this closely, consider cards referral if more frequent episodes. Will reassess next month with better BP control.      Hypertension    Previously stable off meds, today BP elevated in office. Pt endorses home readings running 426 systolic on average. Will start amlodipine 5mg  daily. RTC 4 mo f/u visit.       Relevant Medications   amLODipine (NORVASC) 5 MG tablet   Chronic bilateral low back pain with bilateral sciatica - Primary    Ongoing for months. Benign exam. Suspect spinal stenosis - check baseline lumbar films today. Rx supportively with rest, tylenol, stretching exercises provided today. Discussed PT referral vs lumbar MRI (if worsening bowel/bladder control trouble). Pt agrees with plan.       Relevant Orders   DG Lumbar Spine Complete       Meds ordered this encounter  Medications  . amLODipine (NORVASC) 5 MG tablet    Sig: Take 1 tablet (5 mg total) by mouth daily.    Dispense:  30 tablet    Refill:  6   Orders Placed This Encounter  Procedures  . DG Lumbar Spine Complete      Standing Status:   Future    Number of Occurrences:   1    Standing Expiration Date:   11/07/2019    Order Specific Question:   Reason for Exam (SYMPTOM  OR DIAGNOSIS REQUIRED)    Answer:   lower back pain    Order Specific Question:   Preferred imaging location?    Answer:   [facility]    Order Specific Question:   Radiology Contrast Protocol - do NOT remove file path    Answer:   [email or web address]  Follow up plan: Return in about 4 weeks (around 10/04/2018), or if symptoms worsen or fail to improve, for follow up visit.  [name], MD

## 2018-09-12 ENCOUNTER — Telehealth: Payer: Self-pay

## 2018-09-12 NOTE — Telephone Encounter (Signed)
Copied from [place] [id number]. Topic: General - Other >> Sep 12, 2018 11:14 AM Janace Aris A wrote: Reason for CRM: patient would like to have her X-ray results given to her.

## 2018-09-12 NOTE — Telephone Encounter (Signed)
Released via mychart. plz call to ensure she got message:  Your lumbar xray returned showing scoliosis of the spine without fracture. There was some wear and tear arthritis with possible bulging discs throughout the spine. The worst seemed to be on the left at your intervertebral disc between L3 and L4.  I think your pain is likely coming from degenerative arthritis changes of the spine.  Let me know if you're interested in a course of physical therapy. Let me know if worsening pain to consider MRI of the lower back.

## 2018-09-13 NOTE — Telephone Encounter (Signed)
Spoke with pt relaying results and message per Dr. Henriette Combs understanding. Pt declines PT at this time.  Also, pt says she forgot to mention at [place] that she has numbness in both heels but it is worse in the left.

## 2018-10-05 ENCOUNTER — Encounter: Payer: Self-pay | Admitting: Family Medicine

## 2018-10-05 ENCOUNTER — Ambulatory Visit (INDEPENDENT_AMBULATORY_CARE_PROVIDER_SITE_OTHER): Payer: Medicare Other | Admitting: Family Medicine

## 2018-10-05 VITALS — BP 136/68 | HR 87 | Temp 98.4°F | Ht 63.0 in | Wt 147.8 lb

## 2018-10-05 DIAGNOSIS — R002 Palpitations: Secondary | ICD-10-CM

## 2018-10-05 DIAGNOSIS — N3946 Mixed incontinence: Secondary | ICD-10-CM | POA: Insufficient documentation

## 2018-10-05 DIAGNOSIS — M5442 Lumbago with sciatica, left side: Secondary | ICD-10-CM

## 2018-10-05 DIAGNOSIS — M5441 Lumbago with sciatica, right side: Secondary | ICD-10-CM | POA: Diagnosis not present

## 2018-10-05 DIAGNOSIS — G8929 Other chronic pain: Secondary | ICD-10-CM

## 2018-10-05 DIAGNOSIS — I10 Essential (primary) hypertension: Secondary | ICD-10-CM

## 2018-10-05 LAB — POC URINALSYSI DIPSTICK (AUTOMATED)
BILIRUBIN UA: NEGATIVE
GLUCOSE UA: NEGATIVE
Ketones, UA: NEGATIVE
NITRITE UA: NEGATIVE
Protein, UA: NEGATIVE
RBC UA: NEGATIVE
Urobilinogen, UA: 0.2 E.U./dL
pH, UA: 6 (ref 5.0–8.0)

## 2018-10-05 MED ORDER — OXYBUTYNIN CHLORIDE 5 MG PO TABS: 5.0000 mg | ORAL_TABLET | Freq: Two times a day (BID) | ORAL | 1 refills | Status: DC | PRN

## 2018-10-05 MED ORDER — AMLODIPINE BESYLATE 5 MG PO TABS: 5.0000 mg | ORAL_TABLET | Freq: Every day | ORAL | 3 refills | Status: DC

## 2018-10-05 NOTE — Assessment & Plan Note (Addendum)
Worsening per patient. Describes both stress and urge symptoms. Has not been previously evaluated for this. Check UA today. Will trial oxybutynin 5mg  bid prn. Update with effect. RTC 2-3 mo f/u.

## 2018-10-05 NOTE — Assessment & Plan Note (Addendum)
No episodes since commencement of amlodipine. Will let me know if recurrent tachyarrhythmia for cardiology referral.

## 2018-10-05 NOTE — Assessment & Plan Note (Signed)
Improved on amlodipine and tolerating well will continue

## 2018-10-05 NOTE — Progress Notes (Addendum)
BP 136/68 (BP Location: Left Arm, Patient Position: Sitting, Cuff Size: Normal)   Pulse 87   Temp 98.4 F (36.9 C) (Oral)   Ht 5\' 3"  (1.6 m)   Wt 147 lb 12 oz (67 kg)   SpO2 96%   BMI 26.17 kg/m    CC: 1 mo f/u visit Subjective:    Patient ID: Donna Vargas, female    DOB: Sep 20, 1946, 72 y.o.   MRN: [id number]  HPI: Donna Vargas is a 72 y.o. female presenting on 10/05/2018 for Hypertension (Here for 1 mo f/u.)   HTN - Compliant with current antihypertensive regimen of amlodipine 5mg .  Does check blood pressures at home: well controlled as well. No low blood pressure readings or symptoms of dizziness/syncope. Denies HA, vision changes, CP/tightness, SOB, leg swelling.  No further palpitations since amlodipine was started.   Ongoing lower back pain with prolonged walking/standing. Inversion table did significantly help. Noted some numbness of L>R heel.   Longstanding history of urinary incontinence. Incomplete emptying, stress incontinence symptoms, and urge incontinence. Sometimes has to push on bladder to completely void. No UTI sxs. Has not tried anything for this. Has not been previously evaluated for this. S/p hysterectomy.   Relevant past medical, surgical, family and social history reviewed and updated as indicated. Interim medical history since our last visit reviewed. Allergies and medications reviewed and updated. Outpatient Medications Prior to Visit  Medication Sig Dispense Refill  . aspirin 81 MG tablet Take 81 mg by mouth daily.    . clonazePAM (KLONOPIN) 0.5 MG tablet TAKE 1 TABLET BY MOUTH AT BEDTIME. 30 tablet 0  . cyanocobalamin (V-R VITAMIN B-12) 500 MCG tablet Take 1 tablet (500 mcg total) by mouth daily.    . diphenhydrAMINE (BENADRYL) 25 mg capsule Take 25 mg by mouth at bedtime.    Marland Kitchen EPIPEN 2-PAK 0.3 MG/0.3ML SOAJ injection   1  . levothyroxine (SYNTHROID, LEVOTHROID) 50 MCG tablet Take 1 tablet (50 mcg total) by mouth daily before breakfast.  90 tablet 3  . omeprazole (PRILOSEC) 40 MG capsule Take 1 capsule (40 mg total) by mouth daily. Take daily for 2 weeks then as needed (for GERD) 30 capsule 3  . sertraline (ZOLOFT) 100 MG tablet TAKE 1 TABLET BY MOUTH DAILY. 30 tablet 4  . amLODipine (NORVASC) 5 MG tablet Take 1 tablet (5 mg total) by mouth daily. 30 tablet 6   No facility-administered medications prior to visit.      Per HPI unless specifically indicated in ROS section below Review of Systems     Objective:    BP 136/68 (BP Location: Left Arm, Patient Position: Sitting, Cuff Size: Normal)   Pulse 87   Temp 98.4 F (36.9 C) (Oral)   Ht 5\' 3"  (1.6 m)   Wt 147 lb 12 oz (67 kg)   SpO2 96%   BMI 26.17 kg/m   Wt Readings from Last 3 Encounters:  10/05/18 147 lb 12 oz (67 kg)  09/06/18 147 lb 4 oz (66.8 kg)  03/22/18 142 lb (64.4 kg)    Physical Exam  Constitutional: She appears well-developed and well-nourished. No distress.  HENT:  Head: Normocephalic and atraumatic.  Mouth/Throat: Oropharynx is clear and moist. No oropharyngeal exudate.  Cardiovascular: Normal rate, regular rhythm and normal heart sounds.  No murmur heard. Pulmonary/Chest: Effort normal and breath sounds normal. No respiratory distress. She has no wheezes. She has no rales.  Musculoskeletal: She exhibits no edema.  Psychiatric: She has a normal  mood and affect.  Nursing note and vitals reviewed.  DG Lumbar Spine Complete CLINICAL DATA:  72 year old female with low back pain. No injury. Initial encounter.  EXAM: LUMBAR SPINE - COMPLETE 4+ VIEW  COMPARISON:  None.  FINDINGS: Scoliosis lumbar spine convex right. No compression fracture or clear pars defect.  T11-12 mild disc narrowing and osteophyte.  T12-L1 mild to moderate disc space narrowing.  L1-2 mild disc space narrowing.  L2-3 moderate disc space narrowing greater on left.  L3-4 marked left-sided and moderate right-sided disc space narrowing. Left lateral osteophyte.  Minimal L3 retrolisthesis.  L4-5 mild to moderate right-sided and mild left-sided disc space narrowing. Minimal L4 retrolisthesis.  L5-S1 facet degenerative changes. Minimal anterior slip L5. Mild L5-S1 disc space narrowing.  Sacroiliac joints intact.  IMPRESSION: Scoliosis lumbar spine convex right with superimposed degenerative changes as detailed above. Disc space narrowing most notable on the left at the L3-4 level.  Electronically Signed   By: Genia Del M.D.   On: 09/06/2018 11:46      EKG - NSR rate 75, normal axis, intervals, no acute ST/T changes, good R wave progression Assessment & Plan:   Problem List Items Addressed This Visit    Palpitations    No episodes since commencement of amlodipine. Will let me know if recurrent tachyarrhythmia for cardiology referral.      Mixed urge and stress incontinence    Worsening per patient. Describes both stress and urge symptoms. Has not been previously evaluated for this. Check UA today. Will trial oxybutynin 5mg  bid prn. Update with effect. RTC 2-3 mo f/u.      Relevant Medications   oxybutynin (DITROPAN) 5 MG tablet   Other Relevant Orders   POCT Urinalysis Dipstick (Automated)   Hypertension - Primary    Improved on amlodipine and tolerating well - will continue.      Relevant Medications   amLODipine (NORVASC) 5 MG tablet   Other Relevant Orders   EKG 12-Lead   Chronic bilateral low back pain with bilateral sciatica    Recent lumbar films showed scoliosis of lumbar spine with DDD changes, narrowing most notable at L L3/4 level. Endorsing ongoing heel numbness, but denies any red flag symptoms (reviewed with patient). Inversion table use has been helpful. She will let me know if desires further evaluation in the form of PT or MRI.           Meds ordered this encounter  Medications  . amLODipine (NORVASC) 5 MG tablet    Sig: Take 1 tablet (5 mg total) by mouth daily.    Dispense:  90 tablet    Refill:  3    . oxybutynin (DITROPAN) 5 MG tablet    Sig: Take 1 tablet (5 mg total) by mouth 2 (two) times daily as needed (incontinence).    Dispense:  40 tablet    Refill:  1   Orders Placed This Encounter  Procedures  . POCT Urinalysis Dipstick (Automated)  . EKG 12-Lead    Follow up plan: Return in about 3 months (around 01/05/2019) for follow up visit.  [name], MD

## 2018-10-05 NOTE — Assessment & Plan Note (Signed)
Recent lumbar films showed scoliosis of lumbar spine with DDD changes, narrowing most notable at L L3/4 level. Endorsing ongoing heel numbness, but denies any red flag symptoms (reviewed with patient). Inversion table use has been helpful. She will let me know if desires further evaluation in the form of PT or MRI.

## 2018-10-05 NOTE — Patient Instructions (Addendum)
EKG today Urinalysis today Continue amlodipine - blood pressures looking better! For urinary symptoms - may try oxybutynin 5mg  twice daily as needed.  Return in 2-3 months for follow up visit.

## 2018-10-06 ENCOUNTER — Other Ambulatory Visit: Payer: Self-pay | Admitting: Family Medicine

## 2018-10-06 NOTE — Telephone Encounter (Signed)
Name of Medication: Clonazepam Name of Pharmacy: [facility] or Written Date and Quantity: 08/31/18, #30/0 Last Office Visit and Type: 10/05/18, f/u Next Office Visit and Type: 01/11/19, 3 mo f/u Last Controlled Substance Agreement Date: 06/08/16 Last UDS: 06/08/16

## 2018-10-07 LAB — URINE CULTURE
MICRO NUMBER:: 91336769
SPECIMEN QUALITY:: ADEQUATE

## 2018-10-08 NOTE — Telephone Encounter (Signed)
Eprescribed.

## 2018-11-08 ENCOUNTER — Other Ambulatory Visit: Payer: Self-pay | Admitting: Family Medicine

## 2018-11-08 NOTE — Telephone Encounter (Signed)
Name of Medication: Clonazepam Name of Pharmacy: [facility] or Written Date and Quantity: 10/08/18 #30 Last Office Visit and Type: 10/05/18 Next Office Visit and Type: 01/11/2019 Last Controlled Substance Agreement Date: 06/08/16 Last UDS: 06/08/16

## 2018-11-09 NOTE — Telephone Encounter (Signed)
Eprescribed.

## 2018-12-08 ENCOUNTER — Encounter: Payer: Self-pay | Admitting: Family Medicine

## 2018-12-08 DIAGNOSIS — M5442 Lumbago with sciatica, left side: Principal | ICD-10-CM

## 2018-12-08 DIAGNOSIS — G8929 Other chronic pain: Secondary | ICD-10-CM

## 2018-12-08 DIAGNOSIS — M419 Scoliosis, unspecified: Secondary | ICD-10-CM

## 2018-12-08 DIAGNOSIS — M5441 Lumbago with sciatica, right side: Principal | ICD-10-CM

## 2018-12-10 DIAGNOSIS — M419 Scoliosis, unspecified: Secondary | ICD-10-CM | POA: Insufficient documentation

## 2018-12-13 ENCOUNTER — Other Ambulatory Visit: Payer: Self-pay | Admitting: Family Medicine

## 2018-12-14 ENCOUNTER — Telehealth: Payer: Self-pay | Admitting: Family Medicine

## 2018-12-14 MED ORDER — AMLODIPINE BESYLATE 10 MG PO TABS: 10.0000 mg | ORAL_TABLET | Freq: Every day | ORAL | 0 refills | Status: DC

## 2018-12-14 NOTE — Telephone Encounter (Signed)
Pt called office stating the last few days her BP has been high. Her last reading was 149/93. Pt request a note sent to Dr.G. Pt wants to know should would he increase the dosage on her bp medication or does she need to be seen. Please advise

## 2018-12-14 NOTE — Telephone Encounter (Signed)
Spoke with pt relaying Dr. Synthia Innocent message. Pt verbalizes understanding and expresses her thanks for the call back.

## 2018-12-14 NOTE — Telephone Encounter (Signed)
I called and spoke with patient to obtain further details.    She states over the past week she has "just not been feeling right and can tell her bp has been running higher".  She denies and chest pain, sob, dizziness, headaches or other abnormality just a general "not feeling right".  Her bp has been ranging 140's - 170 (on one occasion) with diastollics generally in the 90's in recent days.  She says she has been very busy over the holidays with increased stress and wondered if related.   She has follow up for her HTN in February (2/12) and was wondering if she should increase her bp pill in the meantime.  She denies any other changes to diet, health or medications and no other symptoms at the present.    Please advise.

## 2018-12-14 NOTE — Telephone Encounter (Signed)
Agree - let's increase amlodipine to 10mg  daily, new dose at pharmacy. May double up on current dose. Watch for ankle swelling with higher dose.

## 2018-12-19 DIAGNOSIS — D485 Neoplasm of uncertain behavior of skin: Secondary | ICD-10-CM | POA: Diagnosis not present

## 2018-12-19 DIAGNOSIS — L821 Other seborrheic keratosis: Secondary | ICD-10-CM | POA: Diagnosis not present

## 2018-12-19 DIAGNOSIS — L439 Lichen planus, unspecified: Secondary | ICD-10-CM | POA: Diagnosis not present

## 2018-12-19 DIAGNOSIS — L820 Inflamed seborrheic keratosis: Secondary | ICD-10-CM | POA: Diagnosis not present

## 2018-12-19 DIAGNOSIS — L814 Other melanin hyperpigmentation: Secondary | ICD-10-CM | POA: Diagnosis not present

## 2018-12-19 DIAGNOSIS — L570 Actinic keratosis: Secondary | ICD-10-CM | POA: Diagnosis not present

## 2018-12-19 DIAGNOSIS — D225 Melanocytic nevi of trunk: Secondary | ICD-10-CM | POA: Diagnosis not present

## 2019-01-11 ENCOUNTER — Encounter: Payer: Self-pay | Admitting: Family Medicine

## 2019-01-11 ENCOUNTER — Ambulatory Visit (INDEPENDENT_AMBULATORY_CARE_PROVIDER_SITE_OTHER): Payer: Medicare Other | Admitting: Family Medicine

## 2019-01-11 VITALS — BP 120/72 | HR 75 | Temp 98.2°F | Ht 63.0 in | Wt 148.0 lb

## 2019-01-11 DIAGNOSIS — I10 Essential (primary) hypertension: Secondary | ICD-10-CM

## 2019-01-11 DIAGNOSIS — R6889 Other general symptoms and signs: Secondary | ICD-10-CM | POA: Insufficient documentation

## 2019-01-11 DIAGNOSIS — E041 Nontoxic single thyroid nodule: Secondary | ICD-10-CM | POA: Diagnosis not present

## 2019-01-11 DIAGNOSIS — K219 Gastro-esophageal reflux disease without esophagitis: Secondary | ICD-10-CM | POA: Diagnosis not present

## 2019-01-11 DIAGNOSIS — N3946 Mixed incontinence: Secondary | ICD-10-CM

## 2019-01-11 DIAGNOSIS — M5441 Lumbago with sciatica, right side: Secondary | ICD-10-CM | POA: Diagnosis not present

## 2019-01-11 DIAGNOSIS — M419 Scoliosis, unspecified: Secondary | ICD-10-CM

## 2019-01-11 DIAGNOSIS — G8929 Other chronic pain: Secondary | ICD-10-CM | POA: Diagnosis not present

## 2019-01-11 DIAGNOSIS — M5442 Lumbago with sciatica, left side: Secondary | ICD-10-CM

## 2019-01-11 MED ORDER — OXYBUTYNIN CHLORIDE ER 5 MG PO TB24: 5.0000 mg | ORAL_TABLET | Freq: Every day | ORAL | 3 refills | Status: DC

## 2019-01-11 MED ORDER — CLONAZEPAM 0.5 MG PO TABS: 0.5000 mg | ORAL_TABLET | Freq: Every day | ORAL | 1 refills | Status: DC

## 2019-01-11 MED ORDER — OMEPRAZOLE 40 MG PO CPDR: 40.0000 mg | DELAYED_RELEASE_CAPSULE | Freq: Every day | ORAL | 3 refills | Status: DC

## 2019-01-11 NOTE — Assessment & Plan Note (Signed)
Chronic, improved on amlodipine 10mg  dose. Monitor GI symptoms on higher amlodipine dose.

## 2019-01-11 NOTE — Assessment & Plan Note (Signed)
Reviewed dietary choices to control reflux symptoms.  Will restart scheduled omeprazole 40mg  daily for 3 weeks then PRN.  EGD from 2009 showed gastritis. Upcoming GI appt to discuss colonoscopy/EGD ([facility]).

## 2019-01-11 NOTE — Assessment & Plan Note (Signed)
Stable, s/p benign biopsy 2015. No pain or symptoms with palpation.

## 2019-01-11 NOTE — Assessment & Plan Note (Signed)
See above. Oxybutynin IR has helped but likely causing too much dry mouth effect.  Trial extended release oxybutynin.  Will not prescribe other antimuscarinics or myrbetriq as pt requests generic due to cost concerns.  She declines further evaluation/referral at this time.

## 2019-01-11 NOTE — Progress Notes (Signed)
BP 120/72 (BP Location: Right Arm, Patient Position: Sitting, Cuff Size: Normal)   Pulse 75   Temp 98.2 F (36.8 C) (Oral)   Ht 5\' 3"  (1.6 m)   Wt 148 lb (67.1 kg)   SpO2 98%   BMI 26.22 kg/m    CC: 3 mo f/u visit Subjective:    Patient ID: Donna Vargas, female    DOB: Apr 15, 1946, 73 y.o.   MRN: [id number]  HPI: Tiah Heckel is a 73 y.o. female presenting on 01/11/2019 for Urinary Incontinence (Here for 3 mo f/u. Reports improvement. ) and Gastroesophageal Reflux (States she is having to take omeprazole more often. )    Urinary incontinence - described both stress and urge incontinence. UA was normal last visit. Started on oxybutynin 5mg  BID PRN last visit - this has helped some but she still notes trouble in am's and with lifting things. Declines increased dose or uro/gyn eval at this time.   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 We also discussed lumbar support brace - she has started using this. Inversion table helps as well.   HTN - last month we increased amlodipine to 10mg  daily due to elevated BP readings. Tolerating this well.   Requests clonazepam refilled - trying to cut down, to 1/2 tab at bedtime.   GERD - worsening over last 1 month, only taking omeprazole 40mg  as needed. She feels throat and tongue swelling at night time. Hasn't found food to trigger. No diet changes recently. Avoids spicy foods.   Upcoming colonoscopy and EGD ([facility]).      Relevant past medical, surgical, family and social history reviewed and updated as indicated. Interim medical history since our last visit reviewed. Allergies and medications reviewed and updated. Outpatient Medications Prior to Visit  Medication Sig Dispense Refill  . amLODipine (NORVASC) 10 MG tablet Take 1 tablet (10 mg total) by mouth daily. 90 tablet 0  .  aspirin 81 MG tablet Take 81 mg by mouth daily.    . cyanocobalamin (V-R VITAMIN B-12) 500 MCG tablet Take 1 tablet (500 mcg total) by mouth daily.    . diphenhydrAMINE (BENADRYL) 25 mg capsule Take 25 mg by mouth at bedtime.    Marland Kitchen EPIPEN 2-PAK 0.3 MG/0.3ML SOAJ injection as needed.   1  . levothyroxine (SYNTHROID, LEVOTHROID) 50 MCG tablet Take 1 tablet (50 mcg total) by mouth daily before breakfast. 90 tablet 3  . sertraline (ZOLOFT) 100 MG tablet TAKE 1 TABLET BY MOUTH DAILY. 30 tablet 4  . clonazePAM (KLONOPIN) 0.5 MG tablet TAKE 1 TABLET BY MOUTH AT BEDTIME. 30 tablet 1  . omeprazole (PRILOSEC) 40 MG capsule Take 1 capsule (40 mg total) by mouth daily. Take daily for 2 weeks then as needed (for GERD) 30 capsule 3  . oxybutynin (DITROPAN) 5 MG tablet Take 1 tablet (5 mg total) by mouth 2 (two) times daily as needed (incontinence). 40 tablet 1   No facility-administered medications prior to visit.      Per HPI unless specifically indicated in ROS section below Review of Systems Objective:    BP 120/72 (BP Location: Right Arm, Patient Position: Sitting, Cuff Size: Normal)   Pulse 75   Temp 98.2 F (36.8 C) (Oral)   Ht 5\' 3"  (1.6 m)   Wt 148 lb (67.1 kg)   SpO2 98%  BMI 26.22 kg/m   Wt Readings from Last 3 Encounters:  01/11/19 148 lb (67.1 kg)  10/05/18 147 lb 12 oz (67 kg)  09/06/18 147 lb 4 oz (66.8 kg)    Physical Exam Vitals signs and nursing note reviewed.  Constitutional:      Appearance: Normal appearance. She is not ill-appearing.  HENT:     Mouth/Throat:     Mouth: Mucous membranes are moist.     Dentition: Normal dentition.     Tongue: No lesions.     Palate: No lesions.     Pharynx: Oropharynx is clear. No oropharyngeal exudate or posterior oropharyngeal erythema.  Eyes:     Extraocular Movements: Extraocular movements intact.     Pupils: Pupils are equal, round, and reactive to light.  Neck:     Musculoskeletal: Normal range of motion and neck supple. No  neck rigidity or muscular tenderness.     Thyroid: Thyroid mass (asxs R thyroid nodule) present. No thyroid tenderness.  Cardiovascular:     Rate and Rhythm: Normal rate and regular rhythm.     Pulses: Normal pulses.     Heart sounds: Normal heart sounds. No murmur.  Pulmonary:     Effort: Pulmonary effort is normal. No respiratory distress.     Breath sounds: Normal breath sounds. No wheezing, rhonchi or rales.  Lymphadenopathy:     Cervical: No cervical adenopathy.  Neurological:     Mental Status: She is alert.  Psychiatric:        Mood and Affect: Mood normal.        Behavior: Behavior normal.        Thought Content: Thought content normal.       Results for orders placed or performed in visit on 10/05/18  Urine Culture  Result Value Ref Range   MICRO NUMBER: [id number]    SPECIMEN QUALITY: ADEQUATE    Sample Source URINE    STATUS: FINAL    Result:      Three or more organisms present, each greater than 10,000 cu/mL. May represent normal flora contamination from external genitalia. No further testing is required.  POCT Urinalysis Dipstick (Automated)  Result Value Ref Range   Color, UA yellow    Clarity, UA clear    Glucose, UA Negative Negative   Bilirubin, UA negative    Ketones, UA negative    Spec Grav, UA >=1.030 (A) 1.010 - 1.025   Blood, UA negative    pH, UA 6.0 5.0 - 8.0   Protein, UA Negative Negative   Urobilinogen, UA 0.2 0.2 or 1.0 E.U./dL   Nitrite, UA negative    Leukocytes, UA Moderate (2+) (A) Negative   Lab Results  Component Value Date   TSH 3.25 07/25/2018    Assessment & Plan:   Problem List Items Addressed This Visit    Sensation of swollen throat - Primary    Anticipate largely related to dry mouth side effect of anticholinergic IR oxybutynin. Will trial extended release form of medication and update with effect. Pt agrees with plan.  Known R thyroid nodule but asxs with palpation, and she had reassuring benign biopsy 2015       Right  thyroid nodule    Stable, s/p benign biopsy 2015. No pain or symptoms with palpation.       Mixed urge and stress incontinence    See above. Oxybutynin IR has helped but likely causing too much dry mouth effect.  Trial extended release oxybutynin.  Will not  prescribe other antimuscarinics or myrbetriq as pt requests generic due to cost concerns.  She declines further evaluation/referral at this time.       Relevant Medications   oxybutynin (DITROPAN-XL) 5 MG 24 hr tablet   Lumbar scoliosis    By xray - has started HEP through direction of her friend who is a physical therapist.      Hypertension    Chronic, improved on amlodipine 10mg  dose. Monitor GI symptoms on higher amlodipine dose.       GERD (gastroesophageal reflux disease)    Reviewed dietary choices to control reflux symptoms.  Will restart scheduled omeprazole 40mg  daily for 3 weeks then PRN.  EGD from 2009 showed gastritis. Upcoming GI appt to discuss colonoscopy/EGD ([facility]).       Relevant Medications   omeprazole (PRILOSEC) 40 MG capsule   Chronic bilateral low back pain with bilateral sciatica    Stable period as she has started using lumbar support brace and doing HEP through direction of her physical therapist friend. She also continues inversion table use.       Relevant Medications   clonazePAM (KLONOPIN) 0.5 MG tablet       Meds ordered this encounter  Medications  . clonazePAM (KLONOPIN) 0.5 MG tablet    Sig: Take 1 tablet (0.5 mg total) by mouth at bedtime.    Dispense:  30 tablet    Refill:  1  . omeprazole (PRILOSEC) 40 MG capsule    Sig: Take 1 capsule (40 mg total) by mouth daily. Take daily for 3 weeks then as needed (for GERD)    Dispense:  30 capsule    Refill:  3  . oxybutynin (DITROPAN-XL) 5 MG 24 hr tablet    Sig: Take 1 tablet (5 mg total) by mouth at bedtime.    Dispense:  30 tablet    Refill:  3   No orders of the defined types were placed in this encounter.   Patient  Instructions  I think a lot of the throat symptoms are coming from oxybutynin.  Stop oxybutynin immediate release, trial oxybutynin ER (extended release) 5mg  nightly.  For reflux, start omeprazole 40mg  daily for 3 weeks then as needed. Try to take 30 min before largest meal.  If ongoing throat trouble despite above, return to see me.  Good to see you today.    Follow up plan: No follow-ups on file.  [name], MD

## 2019-01-11 NOTE — Assessment & Plan Note (Signed)
Stable period as she has started using lumbar support brace and doing HEP through direction of her physical therapist friend. She also continues inversion table use.

## 2019-01-11 NOTE — Assessment & Plan Note (Signed)
Anticipate largely related to dry mouth side effect of anticholinergic IR oxybutynin. Will trial extended release form of medication and update with effect. Pt agrees with plan.  Known R thyroid nodule but asxs with palpation, and she had reassuring benign biopsy 2015

## 2019-01-11 NOTE — Patient Instructions (Addendum)
I think a lot of the throat symptoms are coming from oxybutynin.  Stop oxybutynin immediate release, trial oxybutynin ER (extended release) 5mg  nightly.  For reflux, start omeprazole 40mg  daily for 3 weeks then as needed. Try to take 30 min before largest meal.  If ongoing throat trouble despite above, return to see me.  Good to see you today.

## 2019-01-11 NOTE — Assessment & Plan Note (Signed)
By xray - has started HEP through direction of her friend who is a physical therapist.

## 2019-01-18 DIAGNOSIS — K210 Gastro-esophageal reflux disease with esophagitis: Secondary | ICD-10-CM | POA: Diagnosis not present

## 2019-01-18 DIAGNOSIS — Z1211 Encounter for screening for malignant neoplasm of colon: Secondary | ICD-10-CM | POA: Diagnosis not present

## 2019-01-29 HISTORY — PX: ESOPHAGOGASTRODUODENOSCOPY: SHX1529

## 2019-01-29 HISTORY — PX: COLONOSCOPY: SHX174

## 2019-02-16 DIAGNOSIS — Z1211 Encounter for screening for malignant neoplasm of colon: Secondary | ICD-10-CM | POA: Diagnosis not present

## 2019-02-16 DIAGNOSIS — K449 Diaphragmatic hernia without obstruction or gangrene: Secondary | ICD-10-CM | POA: Diagnosis not present

## 2019-02-16 DIAGNOSIS — K2970 Gastritis, unspecified, without bleeding: Secondary | ICD-10-CM | POA: Diagnosis not present

## 2019-02-16 DIAGNOSIS — K219 Gastro-esophageal reflux disease without esophagitis: Secondary | ICD-10-CM | POA: Diagnosis not present

## 2019-02-16 DIAGNOSIS — K5730 Diverticulosis of large intestine without perforation or abscess without bleeding: Secondary | ICD-10-CM | POA: Diagnosis not present

## 2019-02-16 HISTORY — PX: COLONOSCOPY: SHX174

## 2019-02-16 HISTORY — PX: ESOPHAGOGASTRODUODENOSCOPY: SHX1529

## 2019-03-20 DIAGNOSIS — J3089 Other allergic rhinitis: Secondary | ICD-10-CM | POA: Diagnosis not present

## 2019-03-25 ENCOUNTER — Other Ambulatory Visit: Payer: Self-pay | Admitting: Family Medicine

## 2019-03-26 NOTE — Telephone Encounter (Signed)
Eprescribed.

## 2019-03-26 NOTE — Telephone Encounter (Signed)
plz phone in due to E prescribing error.  

## 2019-03-27 NOTE — Telephone Encounter (Signed)
Spoke with [facility] providing refill info. Says they will get it ready.

## 2019-03-29 ENCOUNTER — Ambulatory Visit: Payer: PRIVATE HEALTH INSURANCE

## 2019-04-05 ENCOUNTER — Encounter: Payer: PRIVATE HEALTH INSURANCE | Admitting: Family Medicine

## 2019-04-21 ENCOUNTER — Other Ambulatory Visit: Payer: Self-pay | Admitting: Family Medicine

## 2019-05-31 ENCOUNTER — Other Ambulatory Visit: Payer: Self-pay | Admitting: Family Medicine

## 2019-05-31 NOTE — Telephone Encounter (Signed)
Name of Medication: Clonazepam Name of Pharmacy: [facility] or Written Date and Quantity: 05/01/19, #30 Last Office Visit and Type: 01/11/19, f/u Next Office Visit and Type: 08/08/19, CPE Pt 2 Last Controlled Substance Agreement Date: 06/08/16 Last UDS: 06/08/16

## 2019-06-01 NOTE — Telephone Encounter (Signed)
Eprescribed.

## 2019-06-27 ENCOUNTER — Other Ambulatory Visit: Payer: Self-pay | Admitting: Family Medicine

## 2019-06-29 ENCOUNTER — Encounter: Payer: Self-pay | Admitting: Family Medicine

## 2019-06-29 DIAGNOSIS — G8929 Other chronic pain: Secondary | ICD-10-CM

## 2019-07-06 ENCOUNTER — Other Ambulatory Visit: Payer: Self-pay | Admitting: Family Medicine

## 2019-07-18 ENCOUNTER — Ambulatory Visit
Admission: RE | Admit: 2019-07-18 | Discharge: 2019-07-18 | Disposition: A | Discharge status: Home / Self Care | Payer: Medicare Other | Source: Ambulatory Visit | Attending: Family Medicine | Admitting: Family Medicine

## 2019-07-18 ENCOUNTER — Other Ambulatory Visit: Payer: Self-pay

## 2019-07-18 DIAGNOSIS — M5442 Lumbago with sciatica, left side: Secondary | ICD-10-CM | POA: Insufficient documentation

## 2019-07-18 DIAGNOSIS — G8929 Other chronic pain: Secondary | ICD-10-CM | POA: Diagnosis not present

## 2019-07-18 DIAGNOSIS — M545 Low back pain: Secondary | ICD-10-CM | POA: Diagnosis not present

## 2019-07-18 DIAGNOSIS — M5441 Lumbago with sciatica, right side: Secondary | ICD-10-CM | POA: Insufficient documentation

## 2019-07-19 ENCOUNTER — Other Ambulatory Visit: Payer: Self-pay | Admitting: Family Medicine

## 2019-07-19 DIAGNOSIS — G8929 Other chronic pain: Secondary | ICD-10-CM

## 2019-07-19 DIAGNOSIS — M5126 Other intervertebral disc displacement, lumbar region: Secondary | ICD-10-CM

## 2019-07-19 DIAGNOSIS — M5442 Lumbago with sciatica, left side: Secondary | ICD-10-CM

## 2019-07-26 DIAGNOSIS — M79651 Pain in right thigh: Secondary | ICD-10-CM | POA: Diagnosis not present

## 2019-07-26 DIAGNOSIS — I10 Essential (primary) hypertension: Secondary | ICD-10-CM | POA: Diagnosis not present

## 2019-07-26 DIAGNOSIS — M545 Low back pain: Secondary | ICD-10-CM | POA: Diagnosis not present

## 2019-07-26 DIAGNOSIS — G8929 Other chronic pain: Secondary | ICD-10-CM | POA: Diagnosis not present

## 2019-07-26 DIAGNOSIS — Z6826 Body mass index (BMI) 26.0-26.9, adult: Secondary | ICD-10-CM | POA: Diagnosis not present

## 2019-07-31 ENCOUNTER — Other Ambulatory Visit: Payer: Self-pay | Admitting: Family Medicine

## 2019-07-31 DIAGNOSIS — R7303 Prediabetes: Secondary | ICD-10-CM

## 2019-07-31 DIAGNOSIS — I10 Essential (primary) hypertension: Secondary | ICD-10-CM

## 2019-07-31 DIAGNOSIS — E039 Hypothyroidism, unspecified: Secondary | ICD-10-CM

## 2019-07-31 DIAGNOSIS — E785 Hyperlipidemia, unspecified: Secondary | ICD-10-CM

## 2019-08-02 ENCOUNTER — Ambulatory Visit: Payer: PRIVATE HEALTH INSURANCE

## 2019-08-02 ENCOUNTER — Other Ambulatory Visit (INDEPENDENT_AMBULATORY_CARE_PROVIDER_SITE_OTHER): Payer: Medicare Other

## 2019-08-02 ENCOUNTER — Other Ambulatory Visit: Payer: Self-pay | Admitting: Family Medicine

## 2019-08-02 DIAGNOSIS — E039 Hypothyroidism, unspecified: Secondary | ICD-10-CM | POA: Diagnosis not present

## 2019-08-02 DIAGNOSIS — R7303 Prediabetes: Secondary | ICD-10-CM | POA: Diagnosis not present

## 2019-08-02 DIAGNOSIS — E785 Hyperlipidemia, unspecified: Secondary | ICD-10-CM

## 2019-08-02 DIAGNOSIS — I10 Essential (primary) hypertension: Secondary | ICD-10-CM

## 2019-08-02 LAB — COMPREHENSIVE METABOLIC PANEL
ALT: 9 U/L (ref 0–35)
AST: 15 U/L (ref 0–37)
Albumin: 4 g/dL (ref 3.5–5.2)
Alkaline Phosphatase: 78 U/L (ref 39–117)
BUN: 15 mg/dL (ref 6–23)
CO2: 27 mEq/L (ref 19–32)
Calcium: 9.2 mg/dL (ref 8.4–10.5)
Chloride: 106 mEq/L (ref 96–112)
Creatinine, Ser: 0.8 mg/dL (ref 0.40–1.20)
GFR: 70.74 mL/min (ref >60.00)
Glucose, Bld: 93 mg/dL (ref 70–99)
Potassium: 4.2 mEq/L (ref 3.5–5.1)
Sodium: 141 mEq/L (ref 135–145)
Total Bilirubin: 0.3 mg/dL (ref 0.2–1.2)
Total Protein: 7.2 g/dL (ref 6.0–8.3)

## 2019-08-02 LAB — LIPID PANEL
Cholesterol: 241 mg/dL — ABNORMAL HIGH (ref 0–200)
HDL: 47.8 mg/dL (ref >39.00)
LDL Cholesterol: 172 mg/dL — ABNORMAL HIGH (ref 0–99)
NonHDL: 193.22
Total CHOL/HDL Ratio: 5
Triglycerides: 107 mg/dL (ref 0.0–149.0)
VLDL: 21.4 mg/dL (ref 0.0–40.0)

## 2019-08-02 LAB — HEMOGLOBIN A1C: Hgb A1c MFr Bld: 6.4 % (ref 4.6–6.5)

## 2019-08-02 LAB — TSH: TSH: 3.35 u[IU]/mL (ref 0.35–4.50)

## 2019-08-02 LAB — MICROALBUMIN / CREATININE URINE RATIO
Creatinine,U: 172 mg/dL
Microalb Creat Ratio: 1.4 mg/g (ref 0.0–30.0)
Microalb, Ur: 2.3 mg/dL — ABNORMAL HIGH (ref 0.0–1.9)

## 2019-08-02 LAB — T4, FREE: Free T4: 0.72 ng/dL (ref 0.60–1.60)

## 2019-08-02 NOTE — Addendum Note (Signed)
Addended by: Cloyd Stagers on: 08/02/2019 11:02 AM   Modules accepted: Orders

## 2019-08-02 NOTE — Telephone Encounter (Signed)
Eprescribed.

## 2019-08-08 ENCOUNTER — Other Ambulatory Visit: Payer: Self-pay

## 2019-08-08 ENCOUNTER — Encounter: Payer: Self-pay | Admitting: Family Medicine

## 2019-08-08 ENCOUNTER — Ambulatory Visit (INDEPENDENT_AMBULATORY_CARE_PROVIDER_SITE_OTHER): Payer: Medicare Other | Admitting: Family Medicine

## 2019-08-08 VITALS — BP 126/68 | HR 79 | Temp 97.6°F | Ht 62.0 in | Wt 149.1 lb

## 2019-08-08 DIAGNOSIS — Z1231 Encounter for screening mammogram for malignant neoplasm of breast: Secondary | ICD-10-CM

## 2019-08-08 DIAGNOSIS — E039 Hypothyroidism, unspecified: Secondary | ICD-10-CM

## 2019-08-08 DIAGNOSIS — E785 Hyperlipidemia, unspecified: Secondary | ICD-10-CM | POA: Diagnosis not present

## 2019-08-08 DIAGNOSIS — E2839 Other primary ovarian failure: Secondary | ICD-10-CM

## 2019-08-08 DIAGNOSIS — R7303 Prediabetes: Secondary | ICD-10-CM | POA: Diagnosis not present

## 2019-08-08 DIAGNOSIS — N3946 Mixed incontinence: Secondary | ICD-10-CM | POA: Diagnosis not present

## 2019-08-08 DIAGNOSIS — F331 Major depressive disorder, recurrent, moderate: Secondary | ICD-10-CM

## 2019-08-08 DIAGNOSIS — R0989 Other specified symptoms and signs involving the circulatory and respiratory systems: Secondary | ICD-10-CM

## 2019-08-08 DIAGNOSIS — Z7189 Other specified counseling: Secondary | ICD-10-CM | POA: Diagnosis not present

## 2019-08-08 DIAGNOSIS — I10 Essential (primary) hypertension: Secondary | ICD-10-CM

## 2019-08-08 DIAGNOSIS — K219 Gastro-esophageal reflux disease without esophagitis: Secondary | ICD-10-CM | POA: Diagnosis not present

## 2019-08-08 DIAGNOSIS — Z1239 Encounter for other screening for malignant neoplasm of breast: Secondary | ICD-10-CM

## 2019-08-08 DIAGNOSIS — Z0000 Encounter for general adult medical examination without abnormal findings: Secondary | ICD-10-CM | POA: Diagnosis not present

## 2019-08-08 MED ORDER — SERTRALINE HCL 100 MG PO TABS: 150.0000 mg | ORAL_TABLET | Freq: Every day | ORAL | 1 refills | Status: DC

## 2019-08-08 NOTE — Assessment & Plan Note (Signed)

## 2019-08-08 NOTE — Patient Instructions (Addendum)
We will request records of colonoscopy last year from [facility] in [place].  We will order mammogram and bone density scan on same day.  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 You are doing well today! Return as needed or in 1 year for next wellness visit.  We will refer you to counselor and increase sertraline to 1.5 tablets daily. Return as needed or in 2-3 months for follow up visit.    Health Maintenance After Age 71 After age 40, you are at a higher risk for certain long-term diseases and infections as well as injuries from falls. Falls are a major cause of broken bones and head injuries in people who are older than age 93. Getting regular preventive care can help to keep you healthy and well. Preventive care includes getting regular testing and making lifestyle changes as recommended by your health care provider. Talk with your health care provider about:  Which screenings and tests you should have. A screening is a test that checks for a disease when you have no symptoms.  A diet and exercise plan that is right for you. What should I know about screenings and tests to prevent falls? Screening and testing are the best ways to find a health problem early. Early diagnosis and treatment give you the best chance of managing medical conditions that are common after age 43. Certain conditions and lifestyle choices may make you more likely to have a fall. Your health care provider may recommend:  Regular vision checks. Poor vision and conditions such as cataracts can make you more likely to have a fall. If you wear glasses, make sure to get your prescription updated if your vision changes.  Medicine review. Work with your health care provider to regularly review all of the medicines you are taking, including over-the-counter medicines. Ask your health care provider about any side effects that may make you more likely to have a fall. Tell your  health care provider if any medicines that you take make you feel dizzy or sleepy.  Osteoporosis screening. Osteoporosis is a condition that causes the bones to get weaker. This can make the bones weak and cause them to break more easily.  Blood pressure screening. Blood pressure changes and medicines to control blood pressure can make you feel dizzy.  Strength and balance checks. Your health care provider may recommend certain tests to check your strength and balance while standing, walking, or changing positions.  Foot health exam. Foot pain and numbness, as well as not wearing proper footwear, can make you more likely to have a fall.  Depression screening. You may be more likely to have a fall if you have a fear of falling, feel emotionally low, or feel unable to do activities that you used to do.  Alcohol use screening. Using too much alcohol can affect your balance and may make you more likely to have a fall. What actions can I take to lower my risk of falls? General instructions  Talk with your health care provider about your risks for falling. Tell your health care provider if: ? You fall. Be sure to tell your health care provider about all falls, even ones that seem minor. ? You feel dizzy, sleepy, or off-balance.  Take over-the-counter and prescription medicines only as told by your health care provider. These include any supplements.  Eat a healthy diet and maintain a healthy weight. A healthy diet includes low-fat dairy products, low-fat (lean)  meats, and fiber from whole grains, beans, and lots of fruits and vegetables. Home safety  Remove any tripping hazards, such as rugs, cords, and clutter.  Install safety equipment such as grab bars in bathrooms and safety rails on stairs.  Keep rooms and walkways well-lit. Activity   Follow a regular exercise program to stay fit. This will help you maintain your balance. Ask your health care provider what types of exercise are  appropriate for you.  If you need a cane or walker, use it as recommended by your health care provider.  Wear supportive shoes that have nonskid soles. Lifestyle  Do not drink alcohol if your health care provider tells you not to drink.  If you drink alcohol, limit how much you have: ? 0-1 drink a day for women. ? 0-2 drinks a day for men.  Be aware of how much alcohol is in your drink. In the [place] one drink equals one typical bottle of beer (12 oz), one-half glass of wine (5 oz), or one shot of hard liquor (1 oz).  Do not use any products that contain nicotine or tobacco, such as cigarettes and e-cigarettes. If you need help quitting, ask your health care provider. Summary  Having a healthy lifestyle and getting preventive care can help to protect your health and wellness after age 58.  Screening and testing are the best way to find a health problem early and help you avoid having a fall. Early diagnosis and treatment give you the best chance for managing medical conditions that are more common for people who are older than age 70.  Falls are a major cause of broken bones and head injuries in people who are older than age 49. Take precautions to prevent a fall at home.  Work with your health care provider to learn what changes you can make to improve your health and wellness and to prevent falls. This information is not intended to replace advice given to you by your health care provider. Make sure you discuss any questions you have with your health care provider. Document Released: 09/29/2017 Document Revised: 03/09/2019 Document Reviewed: 09/29/2017 Elsevier Patient Education  2020 [employer].
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
## 2019-08-08 NOTE — Assessment & Plan Note (Signed)
Advanced directive: living will scanned 01/2015. Working on HCPOA form. Would want husband to be HCPOA. Has not filled out yet.  

## 2019-08-09 DIAGNOSIS — I6529 Occlusion and stenosis of unspecified carotid artery: Secondary | ICD-10-CM | POA: Insufficient documentation

## 2019-08-09 NOTE — Assessment & Plan Note (Signed)
Chronic, stable on current regimen - continue. 

## 2019-08-09 NOTE — Assessment & Plan Note (Signed)
Encouraged low sugar/ carb diet. Noted increasing trend in A1c

## 2019-08-09 NOTE — Assessment & Plan Note (Addendum)
Chronic, off meds. Reviewed elevated chol levels with patient. Will also updated carotid US given bruit heard - discussed may recommend starting statin. Will await [place] results.  The 10-year ASCVD risk score Mikey Bussing DC Brooke Bonito., et al., 2013) is: 13.2%   Values used to calculate the score:     Age: 73 years     Sex: Female     Is Non-Hispanic African American: No     Diabetic: No     Tobacco smoker: No     Systolic Blood Pressure: 123XX123 mmHg     Is BP treated: Yes     HDL Cholesterol: 47.8 mg/dL     Total Cholesterol: 241 mg/dL

## 2019-08-09 NOTE — Assessment & Plan Note (Signed)
Chronic, deterioration noted predominantly anxiety. Will trial sertraline 150mg  dose and refer to counselor. Pt agrees with plan.

## 2019-08-09 NOTE — Assessment & Plan Note (Signed)
Chronic, stable. Continue current regimen of levothyroxine 39mcg daily.

## 2019-08-09 NOTE — Assessment & Plan Note (Signed)
Check carotid US. 

## 2019-08-09 NOTE — Assessment & Plan Note (Signed)
Continue prilosec 40mg  daily.

## 2019-08-09 NOTE — Assessment & Plan Note (Signed)
Denies significant trouble at this time - doing well on oxybutyning 5mg  XL nightly.

## 2019-09-05 ENCOUNTER — Ambulatory Visit (INDEPENDENT_AMBULATORY_CARE_PROVIDER_SITE_OTHER): Payer: Medicare Other

## 2019-09-05 ENCOUNTER — Other Ambulatory Visit: Payer: Self-pay

## 2019-09-05 DIAGNOSIS — R0989 Other specified symptoms and signs involving the circulatory and respiratory systems: Secondary | ICD-10-CM | POA: Diagnosis not present

## 2019-09-12 ENCOUNTER — Encounter: Payer: Self-pay | Admitting: Family Medicine

## 2019-09-26 ENCOUNTER — Ambulatory Visit (INDEPENDENT_AMBULATORY_CARE_PROVIDER_SITE_OTHER): Payer: Medicare Other | Admitting: Psychology

## 2019-09-26 DIAGNOSIS — F3289 Other specified depressive episodes: Secondary | ICD-10-CM

## 2019-09-26 DIAGNOSIS — F418 Other specified anxiety disorders: Secondary | ICD-10-CM | POA: Diagnosis not present

## 2019-10-01 DIAGNOSIS — U071 COVID-19: Secondary | ICD-10-CM

## 2019-10-01 HISTORY — DX: COVID-19: U07.1

## 2019-10-02 ENCOUNTER — Other Ambulatory Visit: Payer: Self-pay | Admitting: Family Medicine

## 2019-10-02 NOTE — Telephone Encounter (Signed)
Last office visit 08/08/2019 for [place].  Last refilled 08/02/2019 for #30 with 1 refills. Next Appt: 10/23/2019 for 2 month follow up.

## 2019-10-04 NOTE — Telephone Encounter (Signed)
ERx 

## 2019-10-10 ENCOUNTER — Ambulatory Visit (INDEPENDENT_AMBULATORY_CARE_PROVIDER_SITE_OTHER): Payer: Medicare Other | Admitting: Psychology

## 2019-10-10 DIAGNOSIS — F418 Other specified anxiety disorders: Secondary | ICD-10-CM

## 2019-10-10 DIAGNOSIS — F3289 Other specified depressive episodes: Secondary | ICD-10-CM | POA: Diagnosis not present

## 2019-10-17 ENCOUNTER — Ambulatory Visit
Admission: RE | Admit: 2019-10-17 | Discharge: 2019-10-17 | Disposition: A | Discharge status: Home / Self Care | Payer: Medicare Other | Source: Ambulatory Visit | Attending: Family Medicine | Admitting: Family Medicine

## 2019-10-17 DIAGNOSIS — E2839 Other primary ovarian failure: Secondary | ICD-10-CM

## 2019-10-17 DIAGNOSIS — Z1239 Encounter for other screening for malignant neoplasm of breast: Secondary | ICD-10-CM | POA: Insufficient documentation

## 2019-10-17 DIAGNOSIS — Z1231 Encounter for screening mammogram for malignant neoplasm of breast: Secondary | ICD-10-CM | POA: Diagnosis present

## 2019-10-17 DIAGNOSIS — M85852 Other specified disorders of bone density and structure, left thigh: Secondary | ICD-10-CM | POA: Diagnosis not present

## 2019-10-18 ENCOUNTER — Encounter: Payer: Self-pay | Admitting: Family Medicine

## 2019-10-18 LAB — HM MAMMOGRAPHY

## 2019-10-19 ENCOUNTER — Encounter: Payer: Self-pay | Admitting: Family Medicine

## 2019-10-19 DIAGNOSIS — M8580 Other specified disorders of bone density and structure, unspecified site: Secondary | ICD-10-CM | POA: Insufficient documentation

## 2019-10-19 HISTORY — DX: Other specified disorders of bone density and structure, unspecified site: M85.80

## 2019-10-23 ENCOUNTER — Ambulatory Visit: Payer: Medicare Other | Admitting: Family Medicine

## 2019-10-23 ENCOUNTER — Telehealth: Payer: Self-pay

## 2019-10-23 NOTE — Telephone Encounter (Signed)
Per appt note; pt has already cancelled appt for today for 2 mth FU; per note pts granddaughter has + covid and pt is waiting on her test results.FYI to Dr Darnell Level.

## 2019-10-23 NOTE — Telephone Encounter (Signed)
Spoke with pt asking how she doing with counseling and sertraline.  States she seems to be doing better.   Pt informed of pos COVID results today.  Says she started with what she thought was cold sxs about 3 wks ago.   Denies any fever, SOB or body aches.  Just has minor cough and runny nose.

## 2019-10-23 NOTE — Telephone Encounter (Signed)
Noted. This was a mood f/u visit. plz call to see how she's doing with counseling and sertraline 150mg  daily. Would also want to find out about results of her covid test.

## 2019-10-23 NOTE — Telephone Encounter (Signed)
[facility] Night - Client Nonclinical Telephone Record AccessNurse Client [facility] Night - Client Client Site [place] - Night Contact Type Call Who Is Calling Patient / Member / Family / Caregiver Caller Name Riven Forst Caller Phone Number [phone] Patient Name Ece Hardwicke Patient DOB Feb 29, 1946 Call Type Message Only Information Provided Reason for Call Request for General Office Information Initial Comment Unsure if they should keep their appt due to being exposed to covid. Additional Comment Disp. Time Disposition Final User 10/23/2019 7:14:21 AM General Information Provided Yes Wisdom, Melynda Call Closed By: Mauri Pole Transaction Date/Time: 10/23/2019 7:11:48 AM (ET)

## 2019-10-25 NOTE — Telephone Encounter (Signed)
Don't recommend second covid test at this time.  Just recommend self isolation for 10 days from symptom onset, should be able to return to care for mother once has completed 10 days, and is fever free for 24 hours and respiratory symptoms improving. When was first day of symptoms?

## 2019-10-25 NOTE — Telephone Encounter (Signed)
Spoke with pt relaying Dr. Synthia Innocent message.  Verbalizes understanding and states her sxs started about 10/01/19.  Fyi to Dr. Darnell Level.

## 2019-10-25 NOTE — Telephone Encounter (Signed)
Spoke with pt to get update.  States she is just really tired and some drainage.  Says the cough has improved.  Tested at [facility] on 10/20/19.  States she received a letter from them with pos results and that it was reported to local HD.  Pt is asking when she can have a 2nd test to make sure she is neg since she helps care for her elderly mother.

## 2019-10-25 NOTE — Telephone Encounter (Addendum)
plz call for update on symptoms.  Where did she get tested? Do we need to report positive result to HD?

## 2019-10-30 ENCOUNTER — Ambulatory Visit: Payer: Medicare Other | Admitting: Psychology

## 2019-11-21 ENCOUNTER — Encounter: Payer: Self-pay | Admitting: Family Medicine

## 2019-11-21 ENCOUNTER — Ambulatory Visit (INDEPENDENT_AMBULATORY_CARE_PROVIDER_SITE_OTHER): Payer: Medicare Other | Admitting: Family Medicine

## 2019-11-21 ENCOUNTER — Other Ambulatory Visit: Payer: Self-pay

## 2019-11-21 VITALS — BP 118/70 | HR 72 | Temp 97.9°F | Ht 62.0 in | Wt 149.3 lb

## 2019-11-21 DIAGNOSIS — H6123 Impacted cerumen, bilateral: Secondary | ICD-10-CM

## 2019-11-21 DIAGNOSIS — I6523 Occlusion and stenosis of bilateral carotid arteries: Secondary | ICD-10-CM

## 2019-11-21 DIAGNOSIS — H9319 Tinnitus, unspecified ear: Secondary | ICD-10-CM | POA: Insufficient documentation

## 2019-11-21 DIAGNOSIS — H9313 Tinnitus, bilateral: Secondary | ICD-10-CM | POA: Diagnosis not present

## 2019-11-21 NOTE — Patient Instructions (Addendum)
Ear irrigation performed today successfully Stop aspirin.  Let me know if ongoing trouble with ringing in ear (tinnitus) and hearing loss - if that happens we will refer you to audiologist.   Tinnitus Tinnitus refers to hearing a sound when there is no actual source for that sound. This is often described as ringing in the ears. However, people with this condition may hear a variety of noises, in one ear or in both ears. The sounds of tinnitus can be soft, loud, or somewhere in between. Tinnitus can last for a few seconds or can be constant for days. It may go away without treatment and come back at various times. When tinnitus is constant or happens often, it can lead to other problems, such as trouble sleeping and trouble concentrating. Almost everyone experiences tinnitus at some point. Tinnitus that is long-lasting (chronic) or comes back often (recurs) may require medical attention. What are the causes? The cause of tinnitus is often not known. In some cases, it can result from other problems or conditions, including:  Exposure to loud noises from machinery, music, or other sources.  Hearing loss.  Ear or sinus infections.  Earwax buildup.  An object (foreign body) stuck in the ear.  Taking certain medicines.  Drinking alcohol or caffeine.  High blood pressure.  Heart diseases.  Anemia.  Allergies.  Meniere's disease.  Thyroid problems.  Tumors.  A weak, bulging blood vessel (aneurysm) near the ear.  Depression or other mood disorders. What are the signs or symptoms? The main symptom of tinnitus is hearing a sound when there is no source for that sound. It may sound like:  Buzzing.  Roaring.  Ringing.  Blowing air, like the sound heard when you listen to a seashell.  Hissing.  Whistling.  Sizzling.  Humming.  Running water.  A musical note.  Tapping. Symptoms may affect only one ear (unilateral) or both ears (bilateral). How is this  diagnosed? Tinnitus is diagnosed based on your symptoms, your medical history, and a physical exam. Your health care provider may do a thorough hearing test (audiologic exam) if your tinnitus:  Is unilateral.  Causes hearing difficulties.  Lasts 6 months or longer. You may work with a health care provider who specializes in hearing disorders (audiologist). You may be asked questions about your symptoms and how they affect your daily life. You may have other tests done, such as:  CT scan.  MRI.  An imaging test of how blood flows through your blood vessels (angiogram). How is this treated? Treating an underlying medical condition can sometimes make tinnitus go away. If your tinnitus continues, other treatments may include:  Medicines, such as antidepressants or sleeping aids.  Sound generators to mask the tinnitus. These include: ? Tabletop sound machines that play relaxing sounds to help you fall asleep. ? Wearable devices that fit in your ear and play sounds or music. ? Acoustic neural stimulation. This involves using headphones to listen to music that contains an auditory signal. Over time, listening to this signal may change some pathways in your brain and make you less sensitive to tinnitus. This treatment is used for very severe cases when no other treatment is working.  Therapy and counseling to help you manage the stress of living with tinnitus.  Using hearing aids or cochlear implants if your tinnitus is related to hearing loss. Hearing aids are worn in the outer ear. Cochlear implants are surgically placed in the inner ear. Follow these instructions at home: Managing symptoms  When possible, avoid being in loud places and being exposed to loud sounds.  Wear hearing protection, such as earplugs, when you are exposed to loud noises.  Use a white noise machine, a humidifier, or other devices to mask the sound of tinnitus.  Practice techniques for reducing stress,  such as meditation, yoga, or deep breathing. Work with your health care provider if you need help with managing stress.  Sleep with your head slightly raised. This may reduce the impact of tinnitus. General instructions  Do not use stimulants, such as nicotine, alcohol, or caffeine. Talk with your health care provider about other stimulants to avoid. Stimulants are substances that can make you feel alert and attentive by increasing certain activities in the body (such as heart rate and blood pressure). These substances may make tinnitus worse.  Take over-the-counter and prescription medicines only as told by your health care provider.  Try to get plenty of sleep each night.  Keep all follow-up visits as told by your health care provider. This is important. Contact a health care provider if:  Your tinnitus continues for 3 weeks or longer without stopping.  Your symptoms get worse or do not get better with home care.  You develop tinnitus after a head injury.  You have tinnitus along with any of the following: ? Dizziness. ? Loss of balance. ? Nausea and vomiting. Summary  Tinnitus refers to hearing a sound when there is no actual source for that sound. This is often described as ringing in the ears.  Symptoms may affect only one ear (unilateral) or both ears (bilateral).  Use a white noise machine, a humidifier, or other devices to mask the sound of tinnitus.  Do not use stimulants, such as nicotine, alcohol, or caffeine. Talk with your health care provider about other stimulants to avoid. These substances may make tinnitus worse. This information is not intended to replace advice given to you by your health care provider. Make sure you discuss any questions you have with your health care provider. Document Released: 11/16/2005 Document Revised: 10/29/2017 Document Reviewed: 08/26/2017 Elsevier Patient Education  2020 [employer].

## 2019-11-21 NOTE — Assessment & Plan Note (Addendum)
S/p successful cerumen irrigation today. Reviewed this could be an early sign of hearing loss. reviewed medications - benzo, ppi, aspirin could all contribute. Reviewed with patient as well as pros/cons of continued aspirin therapy. Should do just as we off low dose aspiring - will monitor tinnitus off aspirin.

## 2019-11-21 NOTE — Assessment & Plan Note (Signed)
S/p successful irrigation with improved hearing endorsed by patient immediately afterwards. To let me know if ongoing trouble for audiology evaluation.

## 2019-11-21 NOTE — Assessment & Plan Note (Signed)
Mild. Ok to trial off aspirin.

## 2019-11-21 NOTE — Progress Notes (Signed)
This visit was conducted in person.  BP 118/70 (BP Location: Left Arm, Patient Position: Sitting, Cuff Size: Normal)   Pulse 72   Temp 97.9 F (36.6 C) (Temporal)   Ht 5\' 2"  (1.575 m)   Wt 149 lb 5 oz (67.7 kg)   SpO2 97%   BMI 27.31 kg/m    Hearing Screening   125Hz  250Hz  500Hz  1000Hz  2000Hz  3000Hz  4000Hz  6000Hz  8000Hz   Right ear:   25 40 40  0    Left ear:   25 0 40  0     CC: tinnitus Subjective:    Patient ID: Donna Vargas, female    DOB: 11/30/1946, 73 y.o.   MRN: [id number]  HPI: Donna Vargas is a 73 y.o. female presenting on 11/21/2019 for Tinnitus (C/o ringing in bilateral ears and hearing loss. )   Several month h/o tinnitus and decreased hearing worse this past month.  No ear pain, drainage from ears, vertigo, nausea.   On omeprazole 40mg  every day PRN (none recently).  On klonopin 0.5mg  every night.  On aspirin 81mg  daily for mild carotid stenosis.   Covid-19 infection 10/2019 - recovered well.      Relevant past medical, surgical, family and social history reviewed and updated as indicated. Interim medical history since our last visit reviewed. Allergies and medications reviewed and updated. Outpatient Medications Prior to Visit  Medication Sig Dispense Refill  . amLODipine (NORVASC) 10 MG tablet TAKE 1 TABLET BY MOUTH DAILY. 90 tablet 0  . clonazePAM (KLONOPIN) 0.5 MG tablet TAKE 1 TABLET BY MOUTH AT BEDTIME. 30 tablet 1  . cyanocobalamin (V-R VITAMIN B-12) 500 MCG tablet Take 1 tablet (500 mcg total) by mouth daily.    . diphenhydrAMINE (BENADRYL) 25 mg capsule Take 25 mg by mouth at bedtime.    Marland Kitchen EPIPEN 2-PAK 0.3 MG/0.3ML SOAJ injection as needed.   1  . levothyroxine (SYNTHROID) 50 MCG tablet TAKE 1 TABLET BY MOUTH DAILY BEFORE BREAKFAST. 90 tablet 1  . omeprazole (PRILOSEC) 40 MG capsule Take 1 capsule (40 mg total) by mouth daily. Take daily for 3 weeks then as needed (for GERD) 30 capsule 3  . oxybutynin (DITROPAN-XL) 5 MG 24 hr  tablet Take 1 tablet (5 mg total) by mouth at bedtime. 30 tablet 3  . sertraline (ZOLOFT) 100 MG tablet Take 1.5 tablets (150 mg total) by mouth daily. 135 tablet 1  . aspirin 81 MG tablet Take 81 mg by mouth daily.     No facility-administered medications prior to visit.     Per HPI unless specifically indicated in ROS section below Review of Systems Objective:    BP 118/70 (BP Location: Left Arm, Patient Position: Sitting, Cuff Size: Normal)   Pulse 72   Temp 97.9 F (36.6 C) (Temporal)   Ht 5\' 2"  (1.575 m)   Wt 149 lb 5 oz (67.7 kg)   SpO2 97%   BMI 27.31 kg/m   Wt Readings from Last 3 Encounters:  11/21/19 149 lb 5 oz (67.7 kg)  08/08/19 149 lb 1 oz (67.6 kg)  01/11/19 148 lb (67.1 kg)    Physical Exam Vitals and nursing note reviewed.  Constitutional:      Appearance: Normal appearance. She is not ill-appearing.  HENT:     Head: Normocephalic and atraumatic.     Right Ear: Tympanic membrane, ear canal and external ear normal. There is impacted cerumen.     Left Ear: Tympanic membrane, ear canal and external ear  normal. There is impacted cerumen.     Ears:     Comments: Cerumen impaction bilateral ears s/p successful irrigation, able to visualize clear canals and pearly grey TMs with good light reflex bilaterally Neurological:     Mental Status: She is alert.       Results for orders placed or performed in visit on 10/18/19  HM MAMMOGRAPHY  Result Value Ref Range   HM Mammogram 0-4 Bi-Rad 0-4 Bi-Rad, Self Reported Normal   Lab Results  Component Value Date   TSH 3.35 08/02/2019    Assessment & Plan:  This visit occurred during the SARS-CoV-2 public health emergency.  Safety protocols were in place, including screening questions prior to the visit, additional usage of staff PPE, and extensive cleaning of exam room while observing appropriate contact time as indicated for disinfecting solutions.   Problem List Items Addressed This Visit    Tinnitus    S/p  successful cerumen irrigation today. Reviewed this could be an early sign of hearing loss. reviewed medications - benzo, ppi, aspirin could all contribute. Reviewed with patient as well as pros/cons of continued aspirin therapy. Should do just as we off low dose aspiring - will monitor tinnitus off aspirin.       Hearing loss due to cerumen impaction - Primary    S/p successful irrigation with improved hearing endorsed by patient immediately afterwards. To let me know if ongoing trouble for audiology evaluation.       Carotid stenosis    Mild. Ok to trial off aspirin.           No orders of the defined types were placed in this encounter.  No orders of the defined types were placed in this encounter.   Patient instructions: Ear irrigation performed today successfully Stop aspirin.  Let me know if ongoing trouble with ringing in ear (tinnitus) and hearing loss - if that happens we will refer you to audiologist.   Follow up plan: No follow-ups on file.  [name], MD

## 2019-12-02 ENCOUNTER — Other Ambulatory Visit: Payer: Self-pay | Admitting: Family Medicine

## 2019-12-04 NOTE — Telephone Encounter (Signed)
Refill request for Klonopin, last filled 11-02-2019.  Please advise.

## 2019-12-06 NOTE — Telephone Encounter (Signed)
ERx 

## 2019-12-11 ENCOUNTER — Other Ambulatory Visit: Payer: Self-pay | Admitting: Family Medicine

## 2020-01-24 ENCOUNTER — Encounter: Payer: Self-pay | Admitting: Family Medicine

## 2020-01-24 DIAGNOSIS — H9313 Tinnitus, bilateral: Secondary | ICD-10-CM

## 2020-01-24 DIAGNOSIS — H6123 Impacted cerumen, bilateral: Secondary | ICD-10-CM

## 2020-02-08 ENCOUNTER — Other Ambulatory Visit: Payer: Self-pay | Admitting: Family Medicine

## 2020-02-09 NOTE — Telephone Encounter (Signed)
Name of Medication: Clonazepam Name of Pharmacy: [facility] or Written Date and Quantity: 01/06/20, #30 Last Office Visit and Type: 11/21/19, acute hearing prob Next Office Visit and Type: none Last Controlled Substance Agreement Date: 06/08/16 Last UDS: 06/08/16

## 2020-02-12 NOTE — Telephone Encounter (Signed)
ERx 

## 2020-02-20 DIAGNOSIS — H90A32 Mixed conductive and sensorineural hearing loss, unilateral, left ear with restricted hearing on the contralateral side: Secondary | ICD-10-CM | POA: Diagnosis not present

## 2020-02-20 DIAGNOSIS — H9319 Tinnitus, unspecified ear: Secondary | ICD-10-CM | POA: Diagnosis not present

## 2020-02-20 DIAGNOSIS — H903 Sensorineural hearing loss, bilateral: Secondary | ICD-10-CM | POA: Diagnosis not present

## 2020-04-19 ENCOUNTER — Other Ambulatory Visit: Payer: Self-pay | Admitting: Family Medicine

## 2020-04-19 NOTE — Telephone Encounter (Signed)
ERx 

## 2020-04-19 NOTE — Telephone Encounter (Signed)
Name of Medication: Clonazepam Name of Pharmacy: [facility] or Written Date and Quantity: 03/19/20, #30 Last Office Visit and Type: 11/21/19, acute hearing issues;  08/08/19, AWV Next Office Visit and Type: none Last Controlled Substance Agreement Date: 06/08/16 Last UDS: 06/08/16

## 2020-04-25 ENCOUNTER — Other Ambulatory Visit: Payer: Self-pay | Admitting: Family Medicine

## 2020-04-26 DIAGNOSIS — Z23 Encounter for immunization: Secondary | ICD-10-CM | POA: Diagnosis not present

## 2020-05-21 ENCOUNTER — Other Ambulatory Visit: Payer: Self-pay | Admitting: Family Medicine

## 2020-06-12 ENCOUNTER — Ambulatory Visit (INDEPENDENT_AMBULATORY_CARE_PROVIDER_SITE_OTHER): Payer: Medicare Other | Admitting: Family Medicine

## 2020-06-12 ENCOUNTER — Encounter: Payer: Self-pay | Admitting: Family Medicine

## 2020-06-12 ENCOUNTER — Other Ambulatory Visit: Payer: Self-pay

## 2020-06-12 VITALS — BP 128/70 | HR 70 | Temp 97.7°F | Ht 62.0 in | Wt 152.3 lb

## 2020-06-12 DIAGNOSIS — E039 Hypothyroidism, unspecified: Secondary | ICD-10-CM | POA: Diagnosis not present

## 2020-06-12 DIAGNOSIS — M5442 Lumbago with sciatica, left side: Secondary | ICD-10-CM | POA: Diagnosis not present

## 2020-06-12 DIAGNOSIS — E041 Nontoxic single thyroid nodule: Secondary | ICD-10-CM

## 2020-06-12 DIAGNOSIS — G8929 Other chronic pain: Secondary | ICD-10-CM | POA: Diagnosis not present

## 2020-06-12 DIAGNOSIS — M5441 Lumbago with sciatica, right side: Secondary | ICD-10-CM

## 2020-06-12 DIAGNOSIS — R5382 Chronic fatigue, unspecified: Secondary | ICD-10-CM | POA: Insufficient documentation

## 2020-06-12 DIAGNOSIS — E538 Deficiency of other specified B group vitamins: Secondary | ICD-10-CM | POA: Diagnosis not present

## 2020-06-12 DIAGNOSIS — R002 Palpitations: Secondary | ICD-10-CM

## 2020-06-12 DIAGNOSIS — M5126 Other intervertebral disc displacement, lumbar region: Secondary | ICD-10-CM | POA: Diagnosis not present

## 2020-06-12 DIAGNOSIS — M899 Disorder of bone, unspecified: Secondary | ICD-10-CM

## 2020-06-12 LAB — VITAMIN D 25 HYDROXY (VIT D DEFICIENCY, FRACTURES): VITD: 29.9 ng/mL — ABNORMAL LOW (ref 30.00–100.00)

## 2020-06-12 LAB — CBC WITH DIFFERENTIAL/PLATELET
Basophils Absolute: 0.1 10*3/uL (ref 0.0–0.1)
Basophils Relative: 0.8 % (ref 0.0–3.0)
Eosinophils Absolute: 0.2 10*3/uL (ref 0.0–0.7)
Eosinophils Relative: 3.9 % (ref 0.0–5.0)
HCT: 39.6 % (ref 36.0–46.0)
Hemoglobin: 13.2 g/dL (ref 12.0–15.0)
Lymphocytes Relative: 24.2 % (ref 12.0–46.0)
Lymphs Abs: 1.5 10*3/uL (ref 0.7–4.0)
MCHC: 33.3 g/dL (ref 30.0–36.0)
MCV: 91.7 fl (ref 78.0–100.0)
Monocytes Absolute: 0.6 10*3/uL (ref 0.1–1.0)
Monocytes Relative: 9.7 % (ref 3.0–12.0)
Neutro Abs: 3.8 10*3/uL (ref 1.4–7.7)
Neutrophils Relative %: 61.4 % (ref 43.0–77.0)
Platelets: 231 10*3/uL (ref 150.0–400.0)
RBC: 4.31 Mil/uL (ref 3.87–5.11)
RDW: 14.4 % (ref 11.5–15.5)
WBC: 6.3 10*3/uL (ref 4.0–10.5)

## 2020-06-12 LAB — TSH: TSH: 2.59 u[IU]/mL (ref 0.35–4.50)

## 2020-06-12 LAB — T4, FREE: Free T4: 0.69 ng/dL (ref 0.60–1.60)

## 2020-06-12 LAB — VITAMIN B12: Vitamin B-12: 566 pg/mL (ref 211–911)

## 2020-06-12 NOTE — Assessment & Plan Note (Signed)
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 She saw Dr Kathyrn Sheriff neurosurgery 07/2019, declined steroid injection at that time. Offered PT vs return to spine clinic. She opts to try formal PT in [place].

## 2020-06-12 NOTE — Assessment & Plan Note (Addendum)
Associated with weight gain (only 2 lbs in the past year). Check blood work for reversible causes of fatigue. Discussed sertraline dose - she is only taking 100mg  1 tab daily. Med list updated.

## 2020-06-12 NOTE — Patient Instructions (Addendum)
For lower back pain - we could refer for formal physical therapy or refer you back to spine center to consider steroid shots - let me know what you think.  Labs today. We will be in touch with results.

## 2020-06-12 NOTE — Assessment & Plan Note (Addendum)
Isolated episodes noted recently, last 1 month ago.  Check TSH, CBC today.  Update EKG at next physical.

## 2020-06-12 NOTE — Assessment & Plan Note (Signed)
Update b12 - could contribute to fatigue.

## 2020-06-12 NOTE — Assessment & Plan Note (Signed)
Chronic, s/p benign biopsy 2015, largely unchanged.

## 2020-06-12 NOTE — Assessment & Plan Note (Signed)
Notes increasing fatigue, heat intolerance, weight gain. Update TFTs. She continues levothyroxine 59mcg daily.

## 2020-06-12 NOTE — Progress Notes (Addendum)
This visit was conducted in person.  BP 128/70 (BP Location: Left Arm, Patient Position: Sitting, Cuff Size: Normal)   Pulse 70   Temp 97.7 F (36.5 C) (Temporal)   Ht 5\' 2"  (1.575 m)   Wt 152 lb 5 oz (69.1 kg)   SpO2 98%   BMI 27.86 kg/m    CC: discuss thyroid Subjective:    Patient ID: Donna Vargas, female    DOB: Apr 23, 1946, 74 y.o.   MRN: [id number]  HPI: Donna Vargas is a 74 y.o. female presenting on 06/12/2020 for Hypothyroidism (Here for f/u.  Concerned about wt gain. ) and Back Pain (C/o low back pain radiating down bilateral legs. Wants to proceed with MRI. )   Noticed trouble with gaining weight recently - up 3 lbs since 10/2019. She feels she's gained 30 lbs in the last year. Notes easy fatigue. Heat intolerance. No constipation, occasional loose stools. No skin or hair changes.  Episode of palpitations when she awoke about 3-4 wks ago, lasted less than a few minutes.   Notes lower back pain down R>L leg - points to lateral leg to knee. Worse with prolonged standing, better with sitting and supine. Symptoms ongoing over a year. Previously had PT (through friend) home exercises, lumbar support brace, and she has inversion table at home she uses. Chronic L foot numbness at heel. No paresthesias, weakness, saddle anesthesia. No bowel/bladder incontinence.  MRI LUMBAR SPINE WITHOUT CONTRAST IMPRESSION: 1. At L2-3 there is a broad-based disc bulge with a left paracentral/foraminal disc protrusion with mass effect on the left intraspinal L3 nerve root. Mild bilateral facet arthropathy. Mild left foraminal stenosis. Electronically Signed   By: Kathreen Devoid   On: 07/18/2019 15:33  H/o COVID-19 infection 10/2019 - symptoms did fully resolve.      Relevant past medical, surgical, family and social history reviewed and updated as indicated. Interim medical history since our last visit reviewed. Allergies and medications reviewed and updated. Outpatient  Medications Prior to Visit  Medication Sig Dispense Refill  . amLODipine (NORVASC) 10 MG tablet TAKE 1 TABLET BY MOUTH DAILY. 90 tablet 2  . clonazePAM (KLONOPIN) 0.5 MG tablet TAKE 1 TABLET BY MOUTH AT BEDTIME. 30 tablet 1  . diphenhydrAMINE (BENADRYL) 25 mg capsule Take 25 mg by mouth at bedtime.    Marland Kitchen levothyroxine (SYNTHROID) 50 MCG tablet TAKE 1 TABLET BY MOUTH DAILY BEFORE BREAKFAST. 90 tablet 1  . omeprazole (PRILOSEC) 40 MG capsule Take 1 capsule (40 mg total) by mouth daily as needed. 30 capsule 3  . sertraline (ZOLOFT) 100 MG tablet Take 1 tablet (100 mg total) by mouth daily.    . sertraline (ZOLOFT) 100 MG tablet TAKE 1 AND 1/2 TABLETS BY MOUTH DAILY 135 tablet 1  . cyanocobalamin (V-R VITAMIN B-12) 500 MCG tablet Take 1 tablet (500 mcg total) by mouth daily. (Patient not taking: Reported on 06/12/2020)    . oxybutynin (DITROPAN-XL) 5 MG 24 hr tablet Take 1 tablet (5 mg total) by mouth at bedtime. (Patient not taking: Reported on 06/12/2020) 30 tablet 3  . EPIPEN 2-PAK 0.3 MG/0.3ML SOAJ injection as needed.   1   No facility-administered medications prior to visit.     Per HPI unless specifically indicated in ROS section below Review of Systems Objective:  BP 128/70 (BP Location: Left Arm, Patient Position: Sitting, Cuff Size: Normal)   Pulse 70   Temp 97.7 F (36.5 C) (Temporal)   Ht 5\' 2"  (1.575 m)  Wt 152 lb 5 oz (69.1 kg)   SpO2 98%   BMI 27.86 kg/m   Wt Readings from Last 3 Encounters:  06/12/20 152 lb 5 oz (69.1 kg)  11/21/19 149 lb 5 oz (67.7 kg)  08/08/19 149 lb 1 oz (67.6 kg)      Physical Exam Vitals and nursing note reviewed.  Constitutional:      Appearance: Normal appearance. She is not ill-appearing.  Neck:     Thyroid: Thyroid mass (R thyroid nodule, chronic) present. No thyromegaly or thyroid tenderness.  Cardiovascular:     Rate and Rhythm: Normal rate and regular rhythm.     Pulses: Normal pulses.     Heart sounds: Normal heart sounds. No murmur  heard.   Pulmonary:     Effort: Pulmonary effort is normal. No respiratory distress.     Breath sounds: Normal breath sounds. No wheezing, rhonchi or rales.  Musculoskeletal:     Cervical back: Normal range of motion and neck supple.     Right lower leg: No edema.     Left lower leg: No edema.  Skin:    General: Skin is warm and dry.     Findings: No rash.  Neurological:     Mental Status: She is alert.  Psychiatric:        Mood and Affect: Mood normal.        Behavior: Behavior normal.       Results for orders placed or performed in visit on 10/18/19  HM MAMMOGRAPHY  Result Value Ref Range   HM Mammogram 0-4 Bi-Rad 0-4 Bi-Rad, Self Reported Normal   Assessment & Plan:  This visit occurred during the SARS-CoV-2 public health emergency.  Safety protocols were in place, including screening questions prior to the visit, additional usage of staff PPE, and extensive cleaning of exam room while observing appropriate contact time as indicated for disinfecting solutions.   Problem List Items Addressed This Visit    Right thyroid nodule    Chronic, s/p benign biopsy 2015, largely unchanged.      Palpitations    Isolated episodes noted recently, last 1 month ago.  Check TSH, CBC today.  Update EKG at next physical.       Lumbar herniated disc   Relevant Orders   Ambulatory referral to Physical Therapy   Low serum vitamin B12    Update b12 - could contribute to fatigue.       Relevant Orders   Vitamin B12   Hypothyroidism    Notes increasing fatigue, heat intolerance, weight gain. Update TFTs. She continues levothyroxine 18mcg daily.       Relevant Orders   TSH   T4, free   Chronic fatigue - Primary    Associated with weight gain (only 2 lbs in the past year). Check blood work for reversible causes of fatigue. Discussed sertraline dose - she is only taking 100mg  1 tab daily. Med list updated.       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 She saw Dr Kathyrn Sheriff neurosurgery 07/2019, declined steroid injection at that time. Offered PT vs return to spine clinic. She opts to try formal PT in House.  Relevant Medications   sertraline (ZOLOFT) 100 MG tablet   Other Relevant Orders   Ambulatory referral to Physical Therapy    Other Visit Diagnoses    Disorder of bone       Relevant Orders   VITAMIN D 25 Hydroxy (Vit-D Deficiency, Fractures)       No orders of the defined types were placed in this encounter.  Orders Placed This Encounter  Procedures  . Vitamin B12  . TSH  . T4, free  . CBC with Differential/Platelet  . VITAMIN D 25 Hydroxy (Vit-D Deficiency, Fractures)  . Ambulatory referral to Physical Therapy    Referral Priority:   Routine    Referral Type:   Physical Medicine    Referral Reason:   Specialty Services Required    Requested Specialty:   Physical Therapy    Number of Visits Requested:   1    Patient Instructions  For lower back pain - we could refer for formal physical therapy or refer you back to spine center to consider steroid shots - let me know what you think.  Labs today. We will be in touch with results.    Follow up plan: Return in 2 months (on 08/13/2020), or if symptoms worsen or fail to improve, for medicare wellness visit.  [name], MD

## 2020-06-18 DIAGNOSIS — M545 Low back pain: Secondary | ICD-10-CM | POA: Diagnosis not present

## 2020-06-20 ENCOUNTER — Other Ambulatory Visit: Payer: Self-pay | Admitting: Family Medicine

## 2020-06-20 DIAGNOSIS — M545 Low back pain: Secondary | ICD-10-CM | POA: Diagnosis not present

## 2020-06-20 NOTE — Telephone Encounter (Signed)
[facility]

## 2020-06-20 NOTE — Telephone Encounter (Signed)
Name of Medication: Clonazepam Name of Pharmacy: [facility] or Written Date and Quantity: 05/21/20, #30 Last Office Visit and Type: 06/12/20, chronic fatigue Next Office Visit and Type: 08/19/20, AWV prt 2 Last Controlled Substance Agreement Date: 06/08/16 Last UDS: 06/08/16

## 2020-06-24 DIAGNOSIS — Z961 Presence of intraocular lens: Secondary | ICD-10-CM | POA: Diagnosis not present

## 2020-06-24 DIAGNOSIS — I10 Essential (primary) hypertension: Secondary | ICD-10-CM | POA: Diagnosis not present

## 2020-06-24 DIAGNOSIS — H11153 Pinguecula, bilateral: Secondary | ICD-10-CM | POA: Diagnosis not present

## 2020-06-24 DIAGNOSIS — H35033 Hypertensive retinopathy, bilateral: Secondary | ICD-10-CM | POA: Diagnosis not present

## 2020-06-24 DIAGNOSIS — H524 Presbyopia: Secondary | ICD-10-CM | POA: Diagnosis not present

## 2020-06-25 DIAGNOSIS — M545 Low back pain: Secondary | ICD-10-CM | POA: Diagnosis not present

## 2020-06-27 DIAGNOSIS — M545 Low back pain: Secondary | ICD-10-CM | POA: Diagnosis not present

## 2020-07-02 DIAGNOSIS — M545 Low back pain: Secondary | ICD-10-CM | POA: Diagnosis not present

## 2020-07-04 DIAGNOSIS — M545 Low back pain: Secondary | ICD-10-CM | POA: Diagnosis not present

## 2020-08-13 ENCOUNTER — Ambulatory Visit (INDEPENDENT_AMBULATORY_CARE_PROVIDER_SITE_OTHER): Payer: Medicare Other

## 2020-08-13 ENCOUNTER — Other Ambulatory Visit: Payer: Self-pay

## 2020-08-13 ENCOUNTER — Other Ambulatory Visit: Payer: Self-pay | Admitting: Family Medicine

## 2020-08-13 ENCOUNTER — Other Ambulatory Visit (INDEPENDENT_AMBULATORY_CARE_PROVIDER_SITE_OTHER): Payer: Medicare Other

## 2020-08-13 DIAGNOSIS — I10 Essential (primary) hypertension: Secondary | ICD-10-CM

## 2020-08-13 DIAGNOSIS — E785 Hyperlipidemia, unspecified: Secondary | ICD-10-CM

## 2020-08-13 DIAGNOSIS — E538 Deficiency of other specified B group vitamins: Secondary | ICD-10-CM

## 2020-08-13 DIAGNOSIS — E039 Hypothyroidism, unspecified: Secondary | ICD-10-CM

## 2020-08-13 DIAGNOSIS — R7303 Prediabetes: Secondary | ICD-10-CM

## 2020-08-13 DIAGNOSIS — Z0000 Encounter for general adult medical examination without abnormal findings: Secondary | ICD-10-CM

## 2020-08-13 LAB — LIPID PANEL
Cholesterol: 243 mg/dL — ABNORMAL HIGH (ref 0–200)
HDL: 42.8 mg/dL (ref >39.00)
LDL Cholesterol: 170 mg/dL — ABNORMAL HIGH (ref 0–99)
NonHDL: 200.18
Total CHOL/HDL Ratio: 6
Triglycerides: 152 mg/dL — ABNORMAL HIGH (ref 0.0–149.0)
VLDL: 30.4 mg/dL (ref 0.0–40.0)

## 2020-08-13 LAB — HEMOGLOBIN A1C: Hgb A1c MFr Bld: 6.2 % (ref 4.6–6.5)

## 2020-08-13 LAB — COMPREHENSIVE METABOLIC PANEL
ALT: 10 U/L (ref 0–35)
AST: 15 U/L (ref 0–37)
Albumin: 4.1 g/dL (ref 3.5–5.2)
Alkaline Phosphatase: 67 U/L (ref 39–117)
BUN: 16 mg/dL (ref 6–23)
CO2: 28 mEq/L (ref 19–32)
Calcium: 9 mg/dL (ref 8.4–10.5)
Chloride: 105 mEq/L (ref 96–112)
Creatinine, Ser: 0.76 mg/dL (ref 0.40–1.20)
GFR: 74.83 mL/min (ref >60.00)
Glucose, Bld: 96 mg/dL (ref 70–99)
Potassium: 3.9 mEq/L (ref 3.5–5.1)
Sodium: 141 mEq/L (ref 135–145)
Total Bilirubin: 0.4 mg/dL (ref 0.2–1.2)
Total Protein: 7.2 g/dL (ref 6.0–8.3)

## 2020-08-13 LAB — MICROALBUMIN / CREATININE URINE RATIO
Creatinine,U: 85 mg/dL
Microalb Creat Ratio: 0.8 mg/g (ref 0.0–30.0)
Microalb, Ur: <0.7 mg/dL (ref 0.0–1.9)

## 2020-08-13 LAB — TSH: TSH: 4.76 u[IU]/mL — ABNORMAL HIGH (ref 0.35–4.50)

## 2020-08-13 LAB — VITAMIN B12: Vitamin B-12: 445 pg/mL (ref 211–911)

## 2020-08-13 NOTE — Patient Instructions (Signed)
Donna Vargas , Thank you for taking time to come for your Medicare Wellness Visit. I appreciate your ongoing commitment to your health goals. Please review the following plan we discussed and let me know if I can assist you in the future.   Screening recommendations/referrals: Colonoscopy: Up to date, completed 02/16/2019, due 01/2029 Mammogram: Up to date, completed 10/18/2019, due 09/2020 Bone Density: Up to date, completed 10/17/2019, due 09/2021 Recommended yearly ophthalmology/optometry visit for glaucoma screening and checkup Recommended yearly dental visit for hygiene and checkup  Vaccinations: Influenza vaccine: declined Pneumococcal vaccine: Completed series Tdap vaccine: Up to date, completed 06/30/2014, due 06/2024 Shingles vaccine: declined  Covid-19:Completed series  Advanced directives: Please bring a copy of your POA (Power of [place]) and/or Living Will to your next appointment.   Conditions/risks identified: hypertension, hyperlipidemia  Next appointment: Follow up in one year for your annual wellness visit    Preventive Care 27 Years and Older, Female Preventive care refers to lifestyle choices and visits with your health care provider that can promote health and wellness. What does preventive care include?  A yearly physical exam. This is also called an annual well check.  Dental exams once or twice a year.  Routine eye exams. Ask your health care provider how often you should have your eyes checked.  Personal lifestyle choices, including:  Daily care of your teeth and gums.  Regular physical activity.  Eating a healthy diet.  Avoiding tobacco and drug use.  Limiting alcohol use.  Practicing safe sex.  Taking low-dose aspirin every day.  Taking vitamin and mineral supplements as recommended by your health care provider. What happens during an annual well check? The services and screenings done by your health care provider during your annual well check  will depend on your age, overall health, lifestyle risk factors, and family history of disease. Counseling  Your health care provider may ask you questions about your:  Alcohol use.  Tobacco use.  Drug use.  Emotional well-being.  Home and relationship well-being.  Sexual activity.  Eating habits.  History of falls.  Memory and ability to understand (cognition).  Work and work Statistician.  Reproductive health. Screening  You may have the following tests or measurements:  Height, weight, and BMI.  Blood pressure.  Lipid and cholesterol levels. These may be checked every 5 years, or more frequently if you are over 69 years old.  Skin check.  Lung cancer screening. You may have this screening every year starting at age 50 if you have a 30-pack-year history of smoking and currently smoke or have quit within the past 15 years.  Fecal occult blood test (FOBT) of the stool. You may have this test every year starting at age 89.  Flexible sigmoidoscopy or colonoscopy. You may have a sigmoidoscopy every 5 years or a colonoscopy every 10 years starting at age 25.  Hepatitis C blood test.  Hepatitis B blood test.  Sexually transmitted disease (STD) testing.  Diabetes screening. This is done by checking your blood sugar (glucose) after you have not eaten for a while (fasting). You may have this done every 1-3 years.  Bone density scan. This is done to screen for osteoporosis. You may have this done starting at age 49.  Mammogram. This may be done every 1-2 years. Talk to your health care provider about how often you should have regular mammograms. Talk with your health care provider about your test results, treatment options, and if necessary, the need for more tests. Vaccines  Your health care provider may recommend certain vaccines, such as:  Influenza vaccine. This is recommended every year.  Tetanus, diphtheria, and acellular pertussis (Tdap, Td) vaccine. You may  need a Td booster every 10 years.  Zoster vaccine. You may need this after age 58.  Pneumococcal 13-valent conjugate (PCV13) vaccine. One dose is recommended after age 26.  Pneumococcal polysaccharide (PPSV23) vaccine. One dose is recommended after age 40. Talk to your health care provider about which screenings and vaccines you need and how often you need them. This information is not intended to replace advice given to you by your health care provider. Make sure you discuss any questions you have with your health care provider. Document Released: 12/13/2015 Document Revised: 08/05/2016 Document Reviewed: 09/17/2015 Elsevier Interactive Patient Education  2017 [place] Prevention in the Home Falls can cause injuries. They can happen to people of all ages. There are many things you can do to make your home safe and to help prevent falls. What can I do on the outside of my home?  Regularly fix the edges of walkways and driveways and fix any cracks.  Remove anything that might make you trip as you walk through a door, such as a raised step or threshold.  Trim any bushes or trees on the path to your home.  Use bright outdoor lighting.  Clear any walking paths of anything that might make someone trip, such as rocks or tools.  Regularly check to see if handrails are loose or broken. Make sure that both sides of any steps have handrails.  Any raised decks and porches should have guardrails on the edges.  Have any leaves, snow, or ice cleared regularly.  Use sand or salt on walking paths during winter.  Clean up any spills in your garage right away. This includes oil or grease spills. What can I do in the bathroom?  Use night lights.  Install grab bars by the toilet and in the tub and shower. Do not use towel bars as grab bars.  Use non-skid mats or decals in the tub or shower.  If you need to sit down in the shower, use a plastic, non-slip stool.  Keep the floor  dry. Clean up any water that spills on the floor as soon as it happens.  Remove soap buildup in the tub or shower regularly.  Attach bath mats securely with double-sided non-slip rug tape.  Do not have throw rugs and other things on the floor that can make you trip. What can I do in the bedroom?  Use night lights.  Make sure that you have a light by your bed that is easy to reach.  Do not use any sheets or blankets that are too big for your bed. They should not hang down onto the floor.  Have a firm chair that has side arms. You can use this for support while you get dressed.  Do not have throw rugs and other things on the floor that can make you trip. What can I do in the kitchen?  Clean up any spills right away.  Avoid walking on wet floors.  Keep items that you use a lot in easy-to-reach places.  If you need to reach something above you, use a strong step stool that has a grab bar.  Keep electrical cords out of the way.  Do not use floor polish or wax that makes floors slippery. If you must use wax, use non-skid floor wax.  Do  not have throw rugs and other things on the floor that can make you trip. What can I do with my stairs?  Do not leave any items on the stairs.  Make sure that there are handrails on both sides of the stairs and use them. Fix handrails that are broken or loose. Make sure that handrails are as long as the stairways.  Check any carpeting to make sure that it is firmly attached to the stairs. Fix any carpet that is loose or worn.  Avoid having throw rugs at the top or bottom of the stairs. If you do have throw rugs, attach them to the floor with carpet tape.  Make sure that you have a light switch at the top of the stairs and the bottom of the stairs. If you do not have them, ask someone to add them for you. What else can I do to help prevent falls?  Wear shoes that:  Do not have high heels.  Have rubber bottoms.  Are comfortable and fit you  well.  Are closed at the toe. Do not wear sandals.  If you use a stepladder:  Make sure that it is fully opened. Do not climb a closed stepladder.  Make sure that both sides of the stepladder are locked into place.  Ask someone to hold it for you, if possible.  Clearly mark and make sure that you can see:  Any grab bars or handrails.  First and last steps.  Where the edge of each step is.  Use tools that help you move around (mobility aids) if they are needed. These include:  Canes.  Walkers.  Scooters.  Crutches.  Turn on the lights when you go into a dark area. Replace any light bulbs as soon as they burn out.  Set up your furniture so you have a clear path. Avoid moving your furniture around.  If any of your floors are uneven, fix them.  If there are any pets around you, be aware of where they are.  Review your medicines with your doctor. Some medicines can make you feel dizzy. This can increase your chance of falling. Ask your doctor what other things that you can do to help prevent falls. This information is not intended to replace advice given to you by your health care provider. Make sure you discuss any questions you have with your health care provider. Document Released: 09/12/2009 Document Revised: 04/23/2016 Document Reviewed: 12/21/2014 Elsevier Interactive Patient Education  2017 [employer].
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
## 2020-08-13 NOTE — Progress Notes (Signed)
PCP notes:  Health Maintenance: Flu- declined   Abnormal Screenings: none   Patient concerns: none   Nurse concerns: none   Next PCP appt.: 08/19/2020 @ 10:30 am

## 2020-08-19 ENCOUNTER — Encounter: Payer: Self-pay | Admitting: Family Medicine

## 2020-08-19 ENCOUNTER — Other Ambulatory Visit: Payer: Self-pay

## 2020-08-19 ENCOUNTER — Ambulatory Visit (INDEPENDENT_AMBULATORY_CARE_PROVIDER_SITE_OTHER): Payer: Medicare Other | Admitting: Family Medicine

## 2020-08-19 VITALS — BP 124/72 | HR 72 | Temp 97.7°F | Ht 62.5 in | Wt 149.0 lb

## 2020-08-19 DIAGNOSIS — E785 Hyperlipidemia, unspecified: Secondary | ICD-10-CM

## 2020-08-19 DIAGNOSIS — J3089 Other allergic rhinitis: Secondary | ICD-10-CM

## 2020-08-19 DIAGNOSIS — I6523 Occlusion and stenosis of bilateral carotid arteries: Secondary | ICD-10-CM

## 2020-08-19 DIAGNOSIS — I10 Essential (primary) hypertension: Secondary | ICD-10-CM | POA: Diagnosis not present

## 2020-08-19 DIAGNOSIS — M85859 Other specified disorders of bone density and structure, unspecified thigh: Secondary | ICD-10-CM

## 2020-08-19 DIAGNOSIS — K219 Gastro-esophageal reflux disease without esophagitis: Secondary | ICD-10-CM

## 2020-08-19 DIAGNOSIS — R7303 Prediabetes: Secondary | ICD-10-CM

## 2020-08-19 DIAGNOSIS — F331 Major depressive disorder, recurrent, moderate: Secondary | ICD-10-CM | POA: Diagnosis not present

## 2020-08-19 DIAGNOSIS — R002 Palpitations: Secondary | ICD-10-CM | POA: Diagnosis not present

## 2020-08-19 DIAGNOSIS — G8929 Other chronic pain: Secondary | ICD-10-CM

## 2020-08-19 DIAGNOSIS — N3946 Mixed incontinence: Secondary | ICD-10-CM

## 2020-08-19 DIAGNOSIS — E538 Deficiency of other specified B group vitamins: Secondary | ICD-10-CM

## 2020-08-19 DIAGNOSIS — M5442 Lumbago with sciatica, left side: Secondary | ICD-10-CM | POA: Diagnosis not present

## 2020-08-19 DIAGNOSIS — Z7189 Other specified counseling: Secondary | ICD-10-CM

## 2020-08-19 DIAGNOSIS — M5441 Lumbago with sciatica, right side: Secondary | ICD-10-CM

## 2020-08-19 DIAGNOSIS — E039 Hypothyroidism, unspecified: Secondary | ICD-10-CM

## 2020-08-19 DIAGNOSIS — E559 Vitamin D deficiency, unspecified: Secondary | ICD-10-CM | POA: Insufficient documentation

## 2020-08-19 MED ORDER — VITAMIN D3 25 MCG (1000 UT) PO CAPS: 1.0000 | ORAL_CAPSULE | Freq: Every day | ORAL | Status: DC

## 2020-08-19 MED ORDER — ATORVASTATIN CALCIUM 20 MG PO TABS: 20.0000 mg | ORAL_TABLET | ORAL | 3 refills | Status: DC

## 2020-08-19 MED ORDER — CLONAZEPAM 0.5 MG PO TABS
0.5000 mg | ORAL_TABLET | Freq: Every day | ORAL | 1 refills | Status: DC
Start: 2020-08-19 — End: 2020-10-17

## 2020-08-19 MED ORDER — FEXOFENADINE HCL 180 MG PO TABS: 180.0000 mg | ORAL_TABLET | Freq: Every day | ORAL | Status: DC

## 2020-08-19 NOTE — Assessment & Plan Note (Signed)
Mild - rec start 1000 IU daily.

## 2020-08-19 NOTE — Assessment & Plan Note (Signed)
Reviewed recent DEXA as well as calcium, vit D recommendations, regular weight bearing exercise. Consider repeat DEXA 2025.

## 2020-08-19 NOTE — Assessment & Plan Note (Signed)
Benefited from formal PT through [facility].

## 2020-08-19 NOTE — Assessment & Plan Note (Addendum)
Chronic, off meds. Reviewed ASCVD risk with patient. Agrees to trial statin - will start atorvastatin 20mg  once weekly.  The 10-year ASCVD risk score Mikey Bussing DC Brooke Bonito., et al., 2013) is: 14.3%   Values used to calculate the score:     Age: 74 years     Sex: Female     Is Non-Hispanic African American: No     Diabetic: No     Tobacco smoker: No     Systolic Blood Pressure: 158 mmHg     Is BP treated: Yes     HDL Cholesterol: 42.8 mg/dL     Total Cholesterol: 243 mg/dL

## 2020-08-19 NOTE — Addendum Note (Signed)
Addended by: Brenton Grills on: 08/10/6815 61:96 AM   Modules accepted: Orders

## 2020-08-19 NOTE — Assessment & Plan Note (Signed)
Chronic stable. Continue current regimen. 

## 2020-08-19 NOTE — Assessment & Plan Note (Signed)
Continue b12 500mcg daily.  

## 2020-08-19 NOTE — Patient Instructions (Addendum)
Update EKG today for recent palpitations  Call Dr Amedeo Plenty' office [facility]) to ask when your last colonoscopy was. If it was 2009, ask to schedule another one.  For osteopenia - aim for 1200mg  calcium/day. If you eat cheese or yogurt, have some greens, and drink a cup of milk daily then you've reached your goal for the day. Also work on regular weight bearing exercise (walking). Consider adding vitamin D 1000 units daily (over the counter).  If interested, check with pharmacy about new 2 shot shingles series (shingrix).  Fill out health care power of attorney form.  Stop benadryl. In its place may use allegra allergy medicine over the counter in the mornings as needed for allergies.  Trial atorvastatin 20mg  once weekly for cholesterol.  Return as needed or in 6 months for follow up visit with fasting labs.   Health Maintenance After Age 51 After age 4, you are at a higher risk for certain long-term diseases and infections as well as injuries from falls. Falls are a major cause of broken bones and head injuries in people who are older than age 50. Getting regular preventive care can help to keep you healthy and well. Preventive care includes getting regular testing and making lifestyle changes as recommended by your health care provider. Talk with your health care provider about:  Which screenings and tests you should have. A screening is a test that checks for a disease when you have no symptoms.  A diet and exercise plan that is right for you. What should I know about screenings and tests to prevent falls? Screening and testing are the best ways to find a health problem early. Early diagnosis and treatment give you the best chance of managing medical conditions that are common after age 3. Certain conditions and lifestyle choices may make you more likely to have a fall. Your health care provider may recommend:  Regular vision checks. Poor vision and conditions such as cataracts can make you more  likely to have a fall. If you wear glasses, make sure to get your prescription updated if your vision changes.  Medicine review. Work with your health care provider to regularly review all of the medicines you are taking, including over-the-counter medicines. Ask your health care provider about any side effects that may make you more likely to have a fall. Tell your health care provider if any medicines that you take make you feel dizzy or sleepy.  Osteoporosis screening. Osteoporosis is a condition that causes the bones to get weaker. This can make the bones weak and cause them to break more easily.  Blood pressure screening. Blood pressure changes and medicines to control blood pressure can make you feel dizzy.  Strength and balance checks. Your health care provider may recommend certain tests to check your strength and balance while standing, walking, or changing positions.  Foot health exam. Foot pain and numbness, as well as not wearing proper footwear, can make you more likely to have a fall.  Depression screening. You may be more likely to have a fall if you have a fear of falling, feel emotionally low, or feel unable to do activities that you used to do.  Alcohol use screening. Using too much alcohol can affect your balance and may make you more likely to have a fall. What actions can I take to lower my risk of falls? General instructions  Talk with your health care provider about your risks for falling. Tell your health care provider if: ?  You fall. Be sure to tell your health care provider about all falls, even ones that seem minor. ? You feel dizzy, sleepy, or off-balance.  Take over-the-counter and prescription medicines only as told by your health care provider. These include any supplements.  Eat a healthy diet and maintain a healthy weight. A healthy diet includes low-fat dairy products, low-fat (lean) meats, and fiber from whole grains, beans, and lots of fruits and  vegetables. Home safety  Remove any tripping hazards, such as rugs, cords, and clutter.  Install safety equipment such as grab bars in bathrooms and safety rails on stairs.  Keep rooms and walkways well-lit. Activity   Follow a regular exercise program to stay fit. This will help you maintain your balance. Ask your health care provider what types of exercise are appropriate for you.  If you need a cane or walker, use it as recommended by your health care provider.  Wear supportive shoes that have nonskid soles. Lifestyle  Do not drink alcohol if your health care provider tells you not to drink.  If you drink alcohol, limit how much you have: ? 0-1 drink a day for women. ? 0-2 drinks a day for men.  Be aware of how much alcohol is in your drink. In the [place] one drink equals one typical bottle of beer (12 oz), one-half glass of wine (5 oz), or one shot of hard liquor (1 oz).  Do not use any products that contain nicotine or tobacco, such as cigarettes and e-cigarettes. If you need help quitting, ask your health care provider. Summary  Having a healthy lifestyle and getting preventive care can help to protect your health and wellness after age 65.  Screening and testing are the best way to find a health problem early and help you avoid having a fall. Early diagnosis and treatment give you the best chance for managing medical conditions that are more common for people who are older than age 75.  Falls are a major cause of broken bones and head injuries in people who are older than age 61. Take precautions to prevent a fall at home.  Work with your health care provider to learn what changes you can make to improve your health and wellness and to prevent falls. This information is not intended to replace advice given to you by your health care provider. Make sure you discuss any questions you have with your health care provider. Document Revised: 03/09/2019 Document Reviewed:  09/29/2017 Elsevier Patient Education  2020 [employer].

## 2020-08-19 NOTE — Assessment & Plan Note (Signed)
Stable period on oxybutynin XL 5mg  daily.

## 2020-08-19 NOTE — Assessment & Plan Note (Signed)
Improved A1c reading today but remains in prediabetes range.

## 2020-08-19 NOTE — Assessment & Plan Note (Addendum)
Chronic, TSH mildly elevated - reassess next visit.

## 2020-08-19 NOTE — Assessment & Plan Note (Signed)
Mild. Consider updating [place] in 1-2 yrs.

## 2020-08-19 NOTE — Assessment & Plan Note (Signed)
Advanced directive: living will scanned 01/2015. Working on HCPOA form. Would want husband to be HCPOA. Has not filled out yet.  

## 2020-08-19 NOTE — Assessment & Plan Note (Signed)
Chronic, stable period on zoloft.

## 2020-08-19 NOTE — Assessment & Plan Note (Signed)
Intermittent. Update EKG.
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
## 2020-08-19 NOTE — Assessment & Plan Note (Signed)
Continues PPI PRN 

## 2020-08-19 NOTE — Assessment & Plan Note (Addendum)
Discussed antihistamine use - stop benadryl, start allegra daily. Previously did not tolerate INS.

## 2020-08-26 DIAGNOSIS — L853 Xerosis cutis: Secondary | ICD-10-CM | POA: Diagnosis not present

## 2020-08-26 DIAGNOSIS — D225 Melanocytic nevi of trunk: Secondary | ICD-10-CM | POA: Diagnosis not present

## 2020-08-26 DIAGNOSIS — L570 Actinic keratosis: Secondary | ICD-10-CM | POA: Diagnosis not present

## 2020-08-26 DIAGNOSIS — L814 Other melanin hyperpigmentation: Secondary | ICD-10-CM | POA: Diagnosis not present

## 2020-08-26 DIAGNOSIS — L821 Other seborrheic keratosis: Secondary | ICD-10-CM | POA: Diagnosis not present

## 2020-10-08 ENCOUNTER — Other Ambulatory Visit: Payer: Self-pay

## 2020-10-08 ENCOUNTER — Other Ambulatory Visit: Payer: Self-pay | Admitting: Family Medicine

## 2020-10-08 DIAGNOSIS — Z1231 Encounter for screening mammogram for malignant neoplasm of breast: Secondary | ICD-10-CM

## 2020-10-15 DIAGNOSIS — L821 Other seborrheic keratosis: Secondary | ICD-10-CM | POA: Diagnosis not present

## 2020-10-15 DIAGNOSIS — L570 Actinic keratosis: Secondary | ICD-10-CM | POA: Diagnosis not present

## 2020-10-17 ENCOUNTER — Other Ambulatory Visit: Payer: Self-pay | Admitting: Family Medicine

## 2020-10-17 NOTE — Telephone Encounter (Signed)
ERx 

## 2020-10-17 NOTE — Telephone Encounter (Signed)
Name of Medication: Clonazepam Name of Pharmacy: [facility] or Written Date and Quantity: 09/18/20, #30 Last Office Visit and Type: 08/19/20, AWV prt 2 Next Office Visit and Type: 02/17/21, 6 mo f/u Last Controlled Substance Agreement Date: 06/08/16 Last UDS: 06/08/16

## 2020-11-19 ENCOUNTER — Ambulatory Visit
Admission: RE | Admit: 2020-11-19 | Discharge: 2020-11-19 | Disposition: A | Discharge status: Home / Self Care | Payer: Medicare Other | Source: Ambulatory Visit | Attending: Family Medicine | Admitting: Family Medicine

## 2020-11-19 ENCOUNTER — Other Ambulatory Visit: Payer: Self-pay

## 2020-11-19 DIAGNOSIS — Z1231 Encounter for screening mammogram for malignant neoplasm of breast: Secondary | ICD-10-CM | POA: Diagnosis not present

## 2020-12-19 ENCOUNTER — Other Ambulatory Visit: Payer: Self-pay | Admitting: Family Medicine

## 2020-12-19 NOTE — Telephone Encounter (Signed)
Pharmacy requests refill on: Clonazepam 0.5 mg   LAST REFILL: 10/17/2020 (Q-30, R-1) LAST OV: 08/19/2020 NEXT OV: 02/17/2021 PHARMACY: [place], [place]

## 2020-12-20 NOTE — Telephone Encounter (Signed)
[facility]

## 2021-01-14 ENCOUNTER — Other Ambulatory Visit: Payer: Self-pay | Admitting: Family Medicine

## 2021-01-14 NOTE — Telephone Encounter (Signed)
Pharmacy requests refill on: Amlodipine 10 mg   LAST REFILL: 12/12/2019 (Q-90, R-2) LAST OV: 08/19/2020 NEXT OV: 02/17/2021 PHARMACY: [facility]

## 2021-01-20 ENCOUNTER — Other Ambulatory Visit: Payer: Self-pay | Admitting: Family Medicine

## 2021-01-20 NOTE — Telephone Encounter (Signed)
Pharmacy requests refill on: Amlodipine 10 mg   LAST REFILL: 01/14/2021 (Q-90, R-0) LAST OV: 08/19/2020 NEXT OV: 02/17/2021 PHARMACY: [facility]

## 2021-02-09 ENCOUNTER — Other Ambulatory Visit: Payer: Self-pay | Admitting: Family Medicine

## 2021-02-09 DIAGNOSIS — E785 Hyperlipidemia, unspecified: Secondary | ICD-10-CM

## 2021-02-09 DIAGNOSIS — E538 Deficiency of other specified B group vitamins: Secondary | ICD-10-CM

## 2021-02-09 DIAGNOSIS — R7303 Prediabetes: Secondary | ICD-10-CM

## 2021-02-09 DIAGNOSIS — E039 Hypothyroidism, unspecified: Secondary | ICD-10-CM

## 2021-02-09 DIAGNOSIS — E559 Vitamin D deficiency, unspecified: Secondary | ICD-10-CM

## 2021-02-10 ENCOUNTER — Other Ambulatory Visit: Payer: Self-pay

## 2021-02-10 ENCOUNTER — Other Ambulatory Visit (INDEPENDENT_AMBULATORY_CARE_PROVIDER_SITE_OTHER): Payer: Medicare Other

## 2021-02-10 DIAGNOSIS — E039 Hypothyroidism, unspecified: Secondary | ICD-10-CM

## 2021-02-10 DIAGNOSIS — R7303 Prediabetes: Secondary | ICD-10-CM | POA: Diagnosis not present

## 2021-02-10 DIAGNOSIS — E559 Vitamin D deficiency, unspecified: Secondary | ICD-10-CM | POA: Diagnosis not present

## 2021-02-10 DIAGNOSIS — E785 Hyperlipidemia, unspecified: Secondary | ICD-10-CM

## 2021-02-10 LAB — HEMOGLOBIN A1C: Hgb A1c MFr Bld: 6.2 % (ref 4.6–6.5)

## 2021-02-10 LAB — LIPID PANEL
Cholesterol: 258 mg/dL — ABNORMAL HIGH (ref 0–200)
HDL: 50.4 mg/dL (ref >39.00)
LDL Cholesterol: 179 mg/dL — ABNORMAL HIGH (ref 0–99)
NonHDL: 207.92
Total CHOL/HDL Ratio: 5
Triglycerides: 145 mg/dL (ref 0.0–149.0)
VLDL: 29 mg/dL (ref 0.0–40.0)

## 2021-02-10 LAB — TSH: TSH: 7.3 u[IU]/mL — ABNORMAL HIGH (ref 0.35–4.50)

## 2021-02-10 LAB — VITAMIN D 25 HYDROXY (VIT D DEFICIENCY, FRACTURES): VITD: 24.94 ng/mL — ABNORMAL LOW (ref 30.00–100.00)

## 2021-02-10 LAB — T4, FREE: Free T4: 0.59 ng/dL — ABNORMAL LOW (ref 0.60–1.60)

## 2021-02-17 ENCOUNTER — Other Ambulatory Visit: Payer: Self-pay

## 2021-02-17 ENCOUNTER — Ambulatory Visit (INDEPENDENT_AMBULATORY_CARE_PROVIDER_SITE_OTHER): Payer: Medicare Other | Admitting: Family Medicine

## 2021-02-17 ENCOUNTER — Encounter: Payer: Self-pay | Admitting: Family Medicine

## 2021-02-17 VITALS — BP 118/72 | HR 64 | Temp 96.9°F | Ht 62.5 in | Wt 151.4 lb

## 2021-02-17 DIAGNOSIS — R5382 Chronic fatigue, unspecified: Secondary | ICD-10-CM | POA: Diagnosis not present

## 2021-02-17 DIAGNOSIS — E559 Vitamin D deficiency, unspecified: Secondary | ICD-10-CM | POA: Diagnosis not present

## 2021-02-17 DIAGNOSIS — I83813 Varicose veins of bilateral lower extremities with pain: Secondary | ICD-10-CM | POA: Insufficient documentation

## 2021-02-17 DIAGNOSIS — I83811 Varicose veins of right lower extremities with pain: Secondary | ICD-10-CM

## 2021-02-17 DIAGNOSIS — E039 Hypothyroidism, unspecified: Secondary | ICD-10-CM | POA: Diagnosis not present

## 2021-02-17 DIAGNOSIS — I8390 Asymptomatic varicose veins of unspecified lower extremity: Secondary | ICD-10-CM | POA: Insufficient documentation

## 2021-02-17 DIAGNOSIS — H9313 Tinnitus, bilateral: Secondary | ICD-10-CM | POA: Diagnosis not present

## 2021-02-17 MED ORDER — LEVOTHYROXINE SODIUM 75 MCG PO TABS: 75.0000 ug | ORAL_TABLET | Freq: Every day | ORAL | 1 refills | Status: DC

## 2021-02-17 MED ORDER — ATORVASTATIN CALCIUM 20 MG PO TABS
20.0000 mg | ORAL_TABLET | ORAL | 3 refills | Status: DC
Start: 2021-02-17 — End: 2021-06-09

## 2021-02-17 MED ORDER — VITAMIN D3 25 MCG (1000 UT) PO CAPS: 2.0000 | ORAL_CAPSULE | Freq: Every day | ORAL | Status: DC

## 2021-02-17 NOTE — Patient Instructions (Addendum)
You are doing well today Slowly titrate atorvastatin as tolerated - to twice weekly to three times weekly and if tolerated, take daily.   Increase levothyroxine to 65mcg daily.  For varicose veins on right leg - use thigh high compression stockings - Rx written out today.  We will refer you back to Dr Richardson Landry.

## 2021-02-17 NOTE — Assessment & Plan Note (Addendum)
Recurrent - desires return to ENT for re evaluation.  Did not resolve off daily aspirin.

## 2021-02-17 NOTE — Assessment & Plan Note (Signed)
Chronic. Increase levothyroxine dose to 36mcg daily.

## 2021-02-17 NOTE — Assessment & Plan Note (Addendum)
Ongoing, recent labwork revealing hypothyroid levels - see above. Will continue to monitor.

## 2021-02-17 NOTE — Progress Notes (Signed)
Patient ID: Donna Vargas, female    DOB: [birth year], 75 y.o.   MRN: [id number]  This visit was conducted in person.  BP 118/72 (BP Location: Left Arm, Patient Position: Sitting, Cuff Size: Normal)   Pulse 64   Temp (!) 96.9 F (36.1 C) (Temporal)   Ht 5' 2.5" (1.588 m)   Wt 151 lb 6 oz (68.7 kg)   SpO2 98%   BMI 27.25 kg/m    CC: 6 mo f/u visit  Subjective:   HPI: Donna Vargas is a 75 y.o. female presenting on 02/17/2021 for Follow-up (6 month f/u)   Overall feeling well these past 6 months. Noted weight gain.   Fatigue - feels she gets tired quickly.   Lower back pain - s/p PT course with significant improvement. Notes bad varicose vein to posterior R leg. Feels knot. Worse when sitting and rest. She's previously had interventions for this through [facility]), may be interested in return.   Notes worsening ear ringing - requests return to ENT.      Relevant past medical, surgical, family and social history reviewed and updated as indicated. Interim medical history since our last visit reviewed. Allergies and medications reviewed and updated. Outpatient Medications Prior to Visit  Medication Sig Dispense Refill  . amLODipine (NORVASC) 10 MG tablet TAKE 1 TABLET BY MOUTH DAILY. 90 tablet 0  . clonazePAM (KLONOPIN) 0.5 MG tablet TAKE 1 TABLET BY MOUTH AT BEDTIME. 30 tablet 1  . cyanocobalamin (V-R VITAMIN B-12) 500 MCG tablet Take 1 tablet (500 mcg total) by mouth daily.    . fexofenadine (ALLEGRA ALLERGY) 180 MG tablet Take 1 tablet (180 mg total) by mouth daily.    Marland Kitchen omeprazole (PRILOSEC) 40 MG capsule Take 1 capsule (40 mg total) by mouth daily as needed. 30 capsule 3  . oxybutynin (DITROPAN-XL) 5 MG 24 hr tablet Take 1 tablet (5 mg total) by mouth at bedtime. 30 tablet 3  . sertraline (ZOLOFT) 100 MG tablet Take 1 tablet (100 mg total) by mouth daily.    Marland Kitchen atorvastatin (LIPITOR) 20 MG tablet Take 1 tablet (20 mg total) by mouth once a  week. 13 tablet 3  . Cholecalciferol (VITAMIN D3) 25 MCG (1000 UT) CAPS Take 1 capsule (1,000 Units total) by mouth daily. 30 capsule   . levothyroxine (SYNTHROID) 50 MCG tablet TAKE 1 TABLET BY MOUTH DAILY BEFORE BREAKFAST. 90 tablet 1   No facility-administered medications prior to visit.     Per HPI unless specifically indicated in ROS section below Review of Systems Objective:  BP 118/72 (BP Location: Left Arm, Patient Position: Sitting, Cuff Size: Normal)   Pulse 64   Temp (!) 96.9 F (36.1 C) (Temporal)   Ht 5' 2.5" (1.588 m)   Wt 151 lb 6 oz (68.7 kg)   SpO2 98%   BMI 27.25 kg/m   Wt Readings from Last 3 Encounters:  02/17/21 151 lb 6 oz (68.7 kg)  08/19/20 149 lb (67.6 kg)  06/12/20 152 lb 5 oz (69.1 kg)      Physical Exam Vitals and nursing note reviewed.  Constitutional:      Appearance: Normal appearance. She is not ill-appearing.  Neck:     Thyroid: No thyroid mass or thyromegaly.  Cardiovascular:     Rate and Rhythm: Normal rate and regular rhythm.     Pulses: Normal pulses.     Heart sounds: Normal heart sounds. No murmur heard.   Pulmonary:  Effort: Pulmonary effort is normal. No respiratory distress.     Breath sounds: Normal breath sounds. No wheezing, rhonchi or rales.  Musculoskeletal:     Cervical back: Normal range of motion and neck supple.     Comments: Varicose vein to R posterior thigh, smaller spider veins throughout legs. No pain with int/ext rotation at bilateral hips   Lymphadenopathy:     Cervical: No cervical adenopathy.  Skin:    General: Skin is warm and dry.     Findings: No rash.  Neurological:     Mental Status: She is alert.  Psychiatric:        Mood and Affect: Mood normal.        Behavior: Behavior normal.       Results for orders placed or performed in visit on 02/10/21  T4, free  Result Value Ref Range   Free T4 0.59 (L) 0.60 - 1.60 ng/dL  Hemoglobin A1c  Result Value Ref Range   Hgb A1c MFr Bld 6.2 4.6 - 6.5 %   TSH  Result Value Ref Range   TSH 7.30 (H) 0.35 - 4.50 uIU/mL  Lipid panel  Result Value Ref Range   Cholesterol 258 (H) 0 - 200 mg/dL   Triglycerides 145.0 0.0 - 149.0 mg/dL   HDL 50.40 >39.00 mg/dL   VLDL 29.0 0.0 - 40.0 mg/dL   LDL Cholesterol 179 (H) 0 - 99 mg/dL   Total CHOL/HDL Ratio 5    NonHDL 207.92   VITAMIN D 25 Hydroxy (Vit-D Deficiency, Fractures)  Result Value Ref Range   VITD 24.94 (L) 30.00 - 100.00 ng/mL   Assessment & Plan:  This visit occurred during the SARS-CoV-2 public health emergency.  Safety protocols were in place, including screening questions prior to the visit, additional usage of staff PPE, and extensive cleaning of exam room while observing appropriate contact time as indicated for disinfecting solutions.   Problem List Items Addressed This Visit    Hypothyroidism - Primary    Chronic. Increase levothyroxine dose to 87mcg daily.       Relevant Medications   levothyroxine (SYNTHROID) 75 MCG tablet   Tinnitus    Recurrent - desires return to ENT for re evaluation.  Did not resolve off daily aspirin.       Relevant Orders   Ambulatory referral to ENT   Chronic fatigue    Ongoing, recent labwork revealing hypothyroid levels - see above. Will continue to monitor.       Vitamin D deficiency    Levels remain low - rec increase to vit D 2000 IU daily.       Varicose vein of leg    Start with compression stocking use - thigh high given symptomatic varicose veins are at posterior R thigh.       Relevant Medications   atorvastatin (LIPITOR) 20 MG tablet       Meds ordered this encounter  Medications  . atorvastatin (LIPITOR) 20 MG tablet    Sig: Take 1 tablet (20 mg total) by mouth every Monday, Wednesday, and Friday.    Dispense:  40 tablet    Refill:  3  . levothyroxine (SYNTHROID) 75 MCG tablet    Sig: Take 1 tablet (75 mcg total) by mouth daily before breakfast.    Dispense:  90 tablet    Refill:  1    Note new dose  .  Cholecalciferol (VITAMIN D3) 25 MCG (1000 UT) CAPS    Sig: Take 2 capsules (2,000  Units total) by mouth daily.   Orders Placed This Encounter  Procedures  . Ambulatory referral to ENT    Referral Priority:   Routine    Referral Type:   Consultation    Referral Reason:   Specialty Services Required    Requested Specialty:   Otolaryngology    Number of Visits Requested:   1    Patient Instructions  You are doing well today Slowly titrate atorvastatin as tolerated - to twice weekly to three times weekly and if tolerated, take daily.   Increase levothyroxine to 64mcg daily.  For varicose veins on right leg - use thigh high compression stockings - Rx written out today.  We will refer you back to Dr Richardson Landry.   Follow up plan: Return in about 6 months (around 08/20/2021), or if symptoms worsen or fail to improve, for medicare wellness visit.  [name], MD

## 2021-02-17 NOTE — Assessment & Plan Note (Signed)
Start with compression stocking use - thigh high given symptomatic varicose veins are at posterior R thigh.

## 2021-02-17 NOTE — Assessment & Plan Note (Signed)
Levels remain low - rec increase to vit D 2000 IU daily.

## 2021-02-19 ENCOUNTER — Other Ambulatory Visit: Payer: Self-pay | Admitting: Family Medicine

## 2021-02-19 NOTE — Telephone Encounter (Signed)
Pharmacy requests refill on: Clonazepam 0.5 mg   LAST REFILL: 12/20/2020 (Q-30, R-1) LAST OV: 02/17/2021 NEXT OV: 08/20/2021 PHARMACY: [facility] [place], [place]

## 2021-02-20 NOTE — Telephone Encounter (Signed)
ERx 

## 2021-04-21 ENCOUNTER — Telehealth: Payer: Self-pay | Admitting: Family Medicine

## 2021-04-21 NOTE — Chronic Care Management (AMB) (Signed)
  Chronic Care Management   Note  04/21/2021 Name: Donna Vargas MRN: [id number] DOB: 1946-07-02  Donna Vargas is a 75 y.o. year old female who is a primary care patient of [name], MD. I reached out to Donna Vargas by phone today in response to a referral sent by Ms. Almyra Deforest Greenstein's PCP, [name], MD.   Ms. Schoenfelder was given information about Chronic Care Management services today including:  1. CCM service includes personalized support from designated clinical staff supervised by her physician, including individualized plan of care and coordination with other care providers 2. 24/7 contact phone numbers for assistance for urgent and routine care needs. 3. Service will only be billed when office clinical staff spend 20 minutes or more in a month to coordinate care. 4. Only one practitioner may furnish and bill the service in a calendar month. 5. The patient may stop CCM services at any time (effective at the end of the month) by phone call to the office staff.   Patient agreed to services and verbal consent obtained.   Follow up plan:   Tatjana Secretary/administrator

## 2021-04-22 ENCOUNTER — Other Ambulatory Visit: Payer: Self-pay | Admitting: Family Medicine

## 2021-04-23 NOTE — Telephone Encounter (Signed)
ERx 

## 2021-04-23 NOTE — Telephone Encounter (Signed)
Name of Medication: Clonazepam Name of Pharmacy: [facility] or Written Date and Quantity: 03/22/21, #30 Last Office Visit and Type: 02/17/21, 6 mo f/u Next Office Visit and Type: 08/20/21, AWV prt 2 Last Controlled Substance Agreement Date: 06/08/16 Last UDS: 06/08/16

## 2021-06-03 ENCOUNTER — Telehealth: Payer: Self-pay

## 2021-06-03 NOTE — Chronic Care Management (AMB) (Addendum)
Chronic Care Management Pharmacy Assistant   Name: Flornce Record  MRN: [id number] DOB: Dec 07, 1945  Donna Vargas is an 75 y.o. year old female who presents for her initial CCM visit with the clinical pharmacist.  Reason for Encounter:  Initial Questions   Conditions to be addressed/monitored: HTN  Recent office visits:  02/17/21 - Dr.Gutierrez PCP - Increase Levothyroxine to 36mcg take 1 tablet daily and increase Vitamin D 2000 IU daily. Referral to ENT. Start compression stockings. Slowly titrate atorvastatin as tolerated - to twice weekly to three times weekly and if tolerated, take daily follow up 6 months.  Recent consult visits:  None inn 6 months  Hospital visits:  None in previous 6 months  Medications: Outpatient Encounter Medications as of 06/03/2021  Medication Sig   clonazePAM (KLONOPIN) 0.5 MG tablet TAKE 1 TABLET BY MOUTH AT BEDTIME.   amLODipine (NORVASC) 10 MG tablet TAKE 1 TABLET BY MOUTH DAILY.   atorvastatin (LIPITOR) 20 MG tablet Take 1 tablet (20 mg total) by mouth every Monday, Wednesday, and Friday.   Cholecalciferol (VITAMIN D3) 25 MCG (1000 UT) CAPS Take 2 capsules (2,000 Units total) by mouth daily.   cyanocobalamin (V-R VITAMIN B-12) 500 MCG tablet Take 1 tablet (500 mcg total) by mouth daily.   fexofenadine (ALLEGRA ALLERGY) 180 MG tablet Take 1 tablet (180 mg total) by mouth daily.   levothyroxine (SYNTHROID) 75 MCG tablet Take 1 tablet (75 mcg total) by mouth daily before breakfast.   omeprazole (PRILOSEC) 40 MG capsule Take 1 capsule (40 mg total) by mouth daily as needed.   oxybutynin (DITROPAN-XL) 5 MG 24 hr tablet Take 1 tablet (5 mg total) by mouth at bedtime.   sertraline (ZOLOFT) 100 MG tablet Take 1 tablet (100 mg total) by mouth daily.   No facility-administered encounter medications on file as of 06/03/2021.   Lab Results  Component Value Date/Time   HGBA1C 6.2 02/10/2021 09:26 AM   HGBA1C 6.2 08/13/2020 08:13 AM    MICROALBUR <0.7 08/13/2020 08:13 AM   MICROALBUR 2.3 (H) 08/02/2019 11:02 AM     BP Readings from Last 3 Encounters:  02/17/21 118/72  08/19/20 124/72  06/12/20 128/70   Have you seen any other providers since your last visit with PCP? No  Any changes in your medications or health? No  Any side effects from any medications? No  Do you have an symptoms or problems not managed by your medications? No  Any concerns about your health right now? No  Has your provider asked that you check blood pressure, blood sugar, or follow special diet at home? The patient reports she is not on any special diet and she will check her BP at home from time to time   Do you get any type of exercise on a regular basis? Yes The patient has been to physical therapy and learned home exercise program  Can you think of a goal you would like to reach for your health? No  Do you have any problems getting your medications? No  Is there anything that you would like to discuss during the appointment? No   Zuriel Yeaman was reminded to have all medications, supplements and any blood glucose and blood pressure readings available for review with [name], Pharm. D, at her telephone visit on 06/09/2021 at 2:00 pm.    Star Rating Drugs: Medication:  Last Fill: Day Supply Atorvastatin 20mg  02/17/21 90  Follow-Up:  Pharmacist Review  [name], CPP notified  [facility] Assistant [phone]  I have reviewed the care management and care coordination activities outlined in this encounter and I am certifying that I agree with the content of this note. No further action required.  [name], PharmD Clinical Pharmacist [facility] at [facility] [phone]

## 2021-06-09 ENCOUNTER — Other Ambulatory Visit: Payer: Self-pay

## 2021-06-09 ENCOUNTER — Telehealth: Payer: Self-pay

## 2021-06-09 ENCOUNTER — Ambulatory Visit (INDEPENDENT_AMBULATORY_CARE_PROVIDER_SITE_OTHER): Payer: Medicare Other

## 2021-06-09 DIAGNOSIS — E039 Hypothyroidism, unspecified: Secondary | ICD-10-CM

## 2021-06-09 DIAGNOSIS — F331 Major depressive disorder, recurrent, moderate: Secondary | ICD-10-CM

## 2021-06-09 DIAGNOSIS — E785 Hyperlipidemia, unspecified: Secondary | ICD-10-CM

## 2021-06-09 MED ORDER — ATORVASTATIN CALCIUM 20 MG PO TABS: 20.0000 mg | ORAL_TABLET | Freq: Every day | ORAL | 0 refills | Status: DC

## 2021-06-09 NOTE — Telephone Encounter (Signed)
Patient needs the following refills to [facility]. She requested to change pharmacies today.  -Amlodipine -Clonazepam -Sertraline   [name], PharmD Clinical Pharmacist [facility] at [facility] [phone]

## 2021-06-09 NOTE — Progress Notes (Signed)
    Chronic Care Management Pharmacy Assistant   Name: Koleen Celia  MRN: [id number] DOB: Jul 26, 1946  Reason for Encounter: [facility] Onboard Process   Medications: Outpatient Encounter Medications as of 06/09/2021  Medication Sig   amLODipine (NORVASC) 10 MG tablet TAKE 1 TABLET BY MOUTH DAILY.   atorvastatin (LIPITOR) 20 MG tablet Take 1 tablet (20 mg total) by mouth every Monday, Wednesday, and Friday.   Cholecalciferol (VITAMIN D3) 25 MCG (1000 UT) CAPS Take 2 capsules (2,000 Units total) by mouth daily.   clonazePAM (KLONOPIN) 0.5 MG tablet TAKE 1 TABLET BY MOUTH AT BEDTIME.   cyanocobalamin (V-R VITAMIN B-12) 500 MCG tablet Take 1 tablet (500 mcg total) by mouth daily.   levocetirizine (XYZAL) 5 MG tablet Take 5 mg by mouth every evening. Replaced Allegra, per patient report   levothyroxine (SYNTHROID) 75 MCG tablet Take 1 tablet (75 mcg total) by mouth daily before breakfast.   omeprazole (PRILOSEC) 40 MG capsule Take 1 capsule (40 mg total) by mouth daily as needed.   oxybutynin (DITROPAN-XL) 5 MG 24 hr tablet Take 1 tablet (5 mg total) by mouth at bedtime. (Patient not taking: Reported on 06/09/2021)   sertraline (ZOLOFT) 100 MG tablet Take 100 mg by mouth daily. 1 and 1/2 tablet daily   No facility-administered encounter medications on file as of 06/09/2021.   Following appointment with [name] on 06/09/21 patient requested to onboard with [facility]. Medication count was completed at visit and onboarding form was completed. Contacted [facility] to coordinate patient's medications for delivery, med sync, and adherence packaging from [facility].   [name], CPP notified  [name], [place] [facility] Assistant [phone]

## 2021-06-09 NOTE — Telephone Encounter (Signed)
Patient is tolerating atorvastatin 20 mg three days per week. I recommended trial increasing to once daily. She has #6 tablets on hand.  She will need a new prescription for once daily. Please send to [facility] if approved.  Lipid Panel     Component Value Date/Time   CHOL 258 (H) 02/10/2021 0926   TRIG 145.0 02/10/2021 0926   HDL 50.40 02/10/2021 0926   CHOLHDL 5 02/10/2021 0926   VLDL 29.0 02/10/2021 0926   LDLCALC 179 (H) 02/10/2021 0926      [name], PharmD Clinical Pharmacist [facility] at [facility] [phone]

## 2021-06-09 NOTE — Progress Notes (Signed)
Chronic Care Management Pharmacy Note  06/09/2021 Name:  Donna Vargas MRN:  [id number] DOB:  1945-12-30  Subjective: Donna Vargas is an 75 y.o. year old female who is a primary patient of [name], MD.  The CCM team was consulted for assistance with disease management and care coordination needs.    Engaged with patient by telephone for initial visit in response to provider referral for pharmacy case management and/or care coordination services.   Consent to Services:  The patient was given the following information about Chronic Care Management services today, agreed to services, and gave verbal consent: 1. CCM service includes personalized support from designated clinical staff supervised by the primary care provider, including individualized plan of care and coordination with other care providers 2. 24/7 contact phone numbers for assistance for urgent and routine care needs. 3. Service will only be billed when office clinical staff spend 20 minutes or more in a month to coordinate care. 4. Only one practitioner may furnish and bill the service in a calendar month. 5.The patient may stop CCM services at any time (effective at the end of the month) by phone call to the office staff. 6. The patient will be responsible for cost sharing (co-pay) of up to 20% of the service fee (after annual deductible is met). Patient agreed to services and consent obtained.  Patient Care Team: [name], MD as PCP - General (Family Medicine) [name], [facility] as Pharmacist (Pharmacist)  Recent office visits:  02/17/21 - Dr.Gutierrez, PCP - 6 month follow up - Increase Levothyroxine to 23mg take 1 tablet daily. Recommend increase Vitamin D to 2000 IU daily. Referral to ENT for tinnitus. Start compression stocking use for varicose veins. Slowly titrate atorvastatin as tolerated - to twice weekly to three times weekly and if tolerated, take daily. Follow up 6 months.   Recent  consult visits:  None in previous 6 months   Hospital visits:  None in previous 6 months  Objective:  Lab Results  Component Value Date   CREATININE 0.76 08/13/2020   BUN 16 08/13/2020   GFR 74.83 08/13/2020   NA 141 08/13/2020   K 3.9 08/13/2020   CALCIUM 9.0 08/13/2020   CO2 28 08/13/2020   GLUCOSE 96 08/13/2020    Lab Results  Component Value Date/Time   HGBA1C 6.2 02/10/2021 09:26 AM   HGBA1C 6.2 08/13/2020 08:13 AM   GFR 74.83 08/13/2020 08:13 AM   GFR 70.74 08/02/2019 10:52 AM   MICROALBUR <0.7 08/13/2020 08:13 AM   MICROALBUR 2.3 (H) 08/02/2019 11:02 AM    Lab Results  Component Value Date   CHOL 258 (H) 02/10/2021   HDL 50.40 02/10/2021   LDLCALC 179 (H) 02/10/2021   TRIG 145.0 02/10/2021   CHOLHDL 5 02/10/2021    Hepatic Function Latest Ref Rng & Units 08/13/2020 08/02/2019 03/17/2018  Total Protein 6.0 - 8.3 g/dL 7.2 7.2 7.4  Albumin 3.5 - 5.2 g/dL 4.1 4.0 4.0  AST 0 - 37 U/L _0 ALT 0 - 35 U/L _1 Alk Phosphatase 39 - 117 U/L 67 78 72  Total Bilirubin 0.2 - 1.2 mg/dL 0.4 0.3 0.4  Bilirubin, Direct 0.0 - 0.3 mg/dL - - -    Lab Results  Component Value Date/Time   TSH 7.30 (H) 02/10/2021 09:26 AM   TSH 4.76 (H) 08/13/2020 08:13 AM   FREET4 0.59 (L) 02/10/2021 09:26 AM   FREET4 0.69 06/12/2020 11:06 AM    CBC  Latest Ref Rng & Units 06/12/2020 01/30/2016 07/18/2014  WBC 4.0 - 10.5 K/uL 6.3 6.2 5.3  Hemoglobin 12.0 - 15.0 g/dL 13.2 13.2 12.8  Hematocrit 36.0 - 46.0 % 39.6 39.5 -  Platelets 150.0 - 400.0 K/uL 231.0 219.0 -    Lab Results  Component Value Date/Time   VD25OH 24.94 (L) 02/10/2021 09:26 AM   VD25OH 29.90 (L) 06/12/2020 11:06 AM    Clinical ASCVD: No  The 10-year ASCVD risk score Mikey Bussing DC Jr., et al., 2013) is: 14.2%   Values used to calculate the score:     Age: 7 years     Sex: Female     Is Non-Hispanic African American: No     Diabetic: No     Tobacco smoker: No     Systolic Blood Pressure: 341 mmHg     Is BP  treated: Yes     HDL Cholesterol: 50.4 mg/dL     Total Cholesterol: 258 mg/dL    Depression screen [facility] 2/9 08/13/2020 08/08/2019 03/17/2018  Decreased Interest 0 1 0  Down, Depressed, Hopeless 0 1 0  PHQ - 2 Score 0 2 0  Altered sleeping 0 0 0  Tired, decreased energy 0 2 1  Change in appetite 0 3 0  Feeling bad or failure about yourself  0 2 0  Trouble concentrating 0 2 0  Moving slowly or fidgety/restless 0 3 1  Suicidal thoughts 0 1 0  PHQ-9 Score 0 15 2  Difficult doing work/chores Not difficult at all - Not difficult at all    Social History   Tobacco Use  Smoking Status Never  Smokeless Tobacco Never   BP Readings from Last 3 Encounters:  02/17/21 118/72  08/19/20 124/72  06/12/20 128/70   Pulse Readings from Last 3 Encounters:  02/17/21 64  08/19/20 72  06/12/20 70   Wt Readings from Last 3 Encounters:  02/17/21 151 lb 6 oz (68.7 kg)  08/19/20 149 lb (67.6 kg)  06/12/20 152 lb 5 oz (69.1 kg)   BMI Readings from Last 3 Encounters:  02/17/21 27.25 kg/m  08/19/20 26.82 kg/m  06/12/20 27.86 kg/m    Assessment/Interventions: Review of patient past medical history, allergies, medications, health status, including review of consultants reports, laboratory and other test data, was performed as part of comprehensive evaluation and provision of chronic care management services.   SDOH:  (Social Determinants of Health) assessments and interventions performed: Yes SDOH Interventions    Flowsheet Row Most Recent Value  SDOH Interventions   Financial Strain Interventions Intervention Not Indicated      SDOH Screenings   Alcohol Screen: Low Risk    Last Alcohol Screening Score (AUDIT): 0  Depression (PHQ2-9): Low Risk    PHQ-2 Score: 0  Financial Resource Strain: Low Risk    Difficulty of Paying Living Expenses: Not hard at all  Food Insecurity: No Food Insecurity   Worried About Charity fundraiser in the Last Year: Never true   Ran Out of Food in the Last  Year: Never true  Housing: Low Risk    Last Housing Risk Score: 0  Physical Activity: Inactive   Days of Exercise per Week: 0 days   Minutes of Exercise per Session: 0 min  Social Connections: Not on file  Stress: No Stress Concern Present   Feeling of Stress : Not at all  Tobacco Use: Low Risk    Smoking Tobacco Use: Never   Smokeless Tobacco Use: Never  Transportation Needs:  No Transportation Needs   Lack of Transportation (Medical): No   Lack of Transportation (Non-Medical): No    CCM Care Plan  Allergies  Allergen Reactions   Hctz [Hydrochlorothiazide]     Blood came to the surface of her legs   Sulfa Antibiotics Rash    Medications Reviewed Today     Reviewed by [name], [facility] (Pharmacist) on 06/21/21 at [place] List Status: <None>   Medication Order Taking? Sig Documenting Provider Last Dose Status Informant  amLODipine (NORVASC) 10 MG tablet [id number] Yes Take 1 tablet (10 mg total) by mouth daily. [name], MD Taking Active   atorvastatin (LIPITOR) 20 MG tablet [id number] Yes Take 1 tablet (20 mg total) by mouth daily. [name], MD Taking Active   Cholecalciferol (VITAMIN D3) 25 MCG (1000 UT) CAPS [id number] Yes Take 2 capsules (2,000 Units total) by mouth daily. [name], MD Taking Active   clonazePAM [facility]) 0.5 MG tablet [id number] Yes Take 1 tablet (0.5 mg total) by mouth at bedtime. [name], MD Taking Active   cyanocobalamin (V-R VITAMIN B-12) 500 MCG tablet [id number] Yes Take 1 tablet (500 mcg total) by mouth daily. [name], MD Taking Active   levocetirizine (XYZAL) 5 MG tablet [id number] Yes Take 5 mg by mouth every evening. Replaced Allegra, per patient report [provider] Taking Active Self  levothyroxine (SYNTHROID) 75 MCG tablet [id number] Yes Take 1 tablet (75 mcg total) by mouth daily before breakfast. [name], MD Taking Active   omeprazole (PRILOSEC) 40 MG capsule [id number] Yes Take  1 capsule (40 mg total) by mouth daily as needed. [name], MD Taking Active   oxybutynin (DITROPAN-XL) 5 MG 24 hr tablet [id number] No Take 1 tablet (5 mg total) by mouth at bedtime.  Patient not taking: Reported on 06/09/2021   [name], MD Not Taking Active   sertraline (ZOLOFT) 100 MG tablet [id number] Yes Take 1 tablet (100 mg total) by mouth daily. 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   Palpitations 09/06/2018   Left lateral epicondylitis 12/06/2017   Hypothyroidism 08/18/2017   Hearing loss due to cerumen impaction 07/09/2017   Abnormal drug screen 05/30/2016   HLD (hyperlipidemia) 10/17/2015   Medicare annual wellness visit, subsequent 09/11/2014   Advanced care planning/counseling discussion 09/11/2014   Right thyroid nodule 09/11/2014   Pain in lateral portion of right knee 08/31/2014   Surgical menopause 08/21/2014   Perennial allergic rhinitis    MDD (major depressive disorder), recurrent episode, moderate ([place])    Arthritis    Hypertension    Prediabetes 06/30/2014    Immunization History  Administered Date(s) Administered   PFIZER(Purple Top)SARS-COV-2 Vaccination 03/31/2020, 04/26/2020   Pneumococcal Conjugate-13 02/15/2017   Pneumococcal Polysaccharide-23 03/17/2018   Tdap 06/30/2014    Conditions to be addressed/monitored:  Hypertension, Hyperlipidemia, GERD, and Hypothyroidism  Care Plan : [place]  Updates made by  [name], [place] since 06/21/2021 12:00 AM     Problem: CHL AMB "PATIENT-SPECIFIC PROBLEM"      Long-Range Goal: Disease Management   Start Date: 06/09/2021  Priority: High  Note:     Current Barriers:  Increased stress, lack of time to care for self   Pharmacist Clinical Goal(s):  Patient will contact provider office for questions/concerns as evidenced notation of same in electronic health record through collaboration with PharmD and provider.   Interventions: 1:1 collaboration with [name], MD regarding development and update of comprehensive plan of care as evidenced by provider attestation and co-signature Inter-disciplinary care team collaboration (see longitudinal plan of care) Comprehensive medication review performed; medication list updated in electronic medical record  Hypertension (BP goal <140/90) -Controlled - per clinic readings -Amlodipine increased to current dose 11/2018. BP stable, controlled since then. -Current treatment: Amlodipine 10 mg - 1 tablet daily -Medications previously tried: HCTZ - unable to tolerate  -Current home readings: none reported, checks on occasion -Denies hypotensive/hypertensive symptoms -Educated on BP goals and benefits of medications for prevention of heart attack, stroke and kidney damage; -Some leg swelling, she props her feet up and it resolves. She has not started wearing compression stockings for her varicose veins. Unclear if swelling may be associated with high dose amlodipine. She has been on higher dose for > 1 year. Recommend swelling evaluation in person with next PCP visit. Pt reports its tolerable at this time.  -Counseled to monitor BP at home with abnormal symptoms, document, and provide log at future appointments -Recommended to continue current medication  Hyperlipidemia: (LDL goal < 70) -Uncontrolled - LDL 179 -She started statin 3 days per week after last lipid panel,  it has not been rechecked. She is tolerating this dose fine. -Current treatment: Atorvastatin 20 mg - 1 tablet every 3 days week -Medications previously tried: none reported  -Current exercise habits: She would like to lose some weight., but reports she limits time on her feet due to hip pain or leg swelling/varicose veins. Minimal activity.  -Educated on Cholesterol goals;  -Recommended INCREASE to atorvastatin every day (approved by PCP consult note). She will try daily for a little while and see how it goes.   MDD with anxiety (Goal: Improve symptoms as evidenced by PHQ-9, GAD-7) -Not ideally controlled -Reports very anxious lately with family stressors  -Current treatment  Sertraline 100 mg - 1 and 1/2 tablet daily in AM (pt taking differently: 1 tab daily) Clonazepam - takes 1 at bedtime every night  -Medications previously tried: none reported  -PHQ-9: Improved from 15 (08/2019) to 0 (07/2020) -GAD-7: Not done -Denies any falls, but does have weak knees/legs and feels unsteady at times.  -Reports sleeping very well -She is interested in resuming 1 and 1/2 tablets daily as prescribed due to anxiety. Agree. She denies time for counseling. Discussed elevated TSH and potential impact on mood.  -Recommended to resume 150 mg/day as prescribed. Recommend updating TSH.   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 We discussed risk/benefits. She does have some dribbling and urinary frequency. She would like to trial it PRN to see if she noticed improvement in symptoms. -Current treatment  Oxybutynin 5 mg XL - 1 tablet daily at bedtime (not currently taking) -Medications previously tried: none  -Recommended to use medication if symptoms are bothersome. Watch for side effects - dry mouth, constipation, confusion, sedation, blurred vision.  Hypothyroidism (Goal: Symptom  control) -Uncontrolled - per labs TSH elevated 01/2021, levothyroxine dose was increased at that time. Next lab 07/2021.  -She is on generic levothyroxine. Takes it with other medications in morning. Does not separate. Mildly elevated TSH on last 2 checks, will offer to bring patient in sooner for labs to ensure this is within goal.  -Current treatment  Levothyroxine 75 mcg - 1 tablet daily before breakfast -Medications previously tried: none  -Recommended to continue current medication; CMA call patient to see if agreeable to check TSH or wait until routine labs in September.   Acid Reflux (Goal: Symptom Control) -Controlled - per pt report -Current treatment  Omeprazole 40 mg - 1 capsule daily as needed -Medications previously tried: none  -Recommended to continue current medication  Other - Allergies She started levocetirizine which replaced Allegra. Allegra not effective.  States she also takes Benadryl every night. Recommended cutting back on this slowly. Vitamin D - Pt affirms increasing to 2000 IU daily after last labs per PCP recommendation.   Patient Goals/Self-Care Activities Patient will:  - focus on medication adherence by starting adherence packaging   Follow Up Plan: Telephone follow up appointment with care management team member scheduled for: 30 days      Medication Assistance: None required.  Patient affirms current coverage meets needs.  Compliance/Adherence/Medication fill history: Care Gaps: Shingrix  COVID-19 booster  Star-Rating Drugs: Medication:                Last Fill:         Day Supply Atorvastatin 14m       02/17/21            90  Patient's preferred pharmacy is:  [facility] [place] [address] [id number]: [phone]: [phone] Uses pill box? No - no reason reported Pt endorses 100% compliance to daily medications   We discussed: Benefits of  medication synchronization, packaging and delivery as well as enhanced pharmacist oversight with [facility]. Patient decided to: Utilize [facility] for medication synchronization, packaging and delivery  Verbal consent obtained for [facility] enhanced pharmacy services (medication synchronization, adherence packaging, delivery coordination). A medication sync plan was created to allow patient to get all medications delivered once every 30 to 90 days per patient preference. Patient understands they have freedom to choose pharmacy and clinical pharmacist will coordinate care between all prescribers and [facility].  Care Plan and Follow Up Patient Decision:  Patient agrees to Care Plan and Follow-up.  MDebbora Dus PharmD Clinical Pharmacist [phone]

## 2021-06-09 NOTE — Telephone Encounter (Signed)
Agree with trial of atorvastatin 20mg  daily. Update with effect. New Rx sent to pharmacy

## 2021-06-11 MED ORDER — AMLODIPINE BESYLATE 10 MG PO TABS: 10.0000 mg | ORAL_TABLET | Freq: Every day | ORAL | 1 refills | Status: DC

## 2021-06-11 NOTE — Telephone Encounter (Signed)
Name of Medication: clonazepam Last Fill or Written Date and Quantity: 04/23/21 #30 w/1 Last Office Visit and Type: 02/17/21  Next Office Visit and Type: 08/20/21 Annual wellness  Name of Medication: sertaline Last Fill or Written Date and Quantity: 06/12/20  n/a Last Office Visit and Type: 02/17/21  Next Office Visit and Type: 08/20/21 Annual wellness

## 2021-06-12 MED ORDER — CLONAZEPAM 0.5 MG PO TABS: 0.5000 mg | ORAL_TABLET | Freq: Every day | ORAL | 1 refills | Status: DC

## 2021-06-12 MED ORDER — SERTRALINE HCL 100 MG PO TABS: 100.0000 mg | ORAL_TABLET | Freq: Every day | ORAL | 3 refills | Status: DC

## 2021-06-12 NOTE — Telephone Encounter (Signed)
ERx 

## 2021-06-19 ENCOUNTER — Telehealth: Payer: Self-pay

## 2021-06-19 MED ORDER — LEVOTHYROXINE SODIUM 75 MCG PO TABS: 75.0000 ug | ORAL_TABLET | Freq: Every day | ORAL | 0 refills | Status: DC

## 2021-06-19 NOTE — Telephone Encounter (Signed)
-----   Message from [place] sent at 06/19/2021  9:40 AM EDT ----- Regarding: Refill Patient recently switched to [facility]. Requested transfer from previous pharmacy. If patient still taking Levothyroxine and oxybutynin would you please send refill to [facility]? Those are the only 2 medications her previous pharmacy did not send.  Thank you,  [name], [place] Pharmacy Assistant [phone]

## 2021-06-19 NOTE — Telephone Encounter (Signed)
E-scribed levothyroxine.  Pt not currently taking oxybutynin.

## 2021-06-21 NOTE — Patient Instructions (Signed)
Dear Donna Vargas,  It was a pleasure meeting you during our initial appointment on June 09, 2021. Below is a summary of the goals we discussed and components of chronic care management. Please contact me anytime with questions or concerns.   Visit Information  Patient Care Plan: CCM Pharmacy Care Plan     Problem Identified: CHL AMB "PATIENT-SPECIFIC PROBLEM"      Long-Range Goal: Disease Management   Start Date: 06/09/2021  Priority: High  Note:     Current Barriers:  Increased stress, lack of time to care for self   Pharmacist Clinical Goal(s):  Patient will contact provider office for questions/concerns as evidenced notation of same in electronic health record through collaboration with PharmD and provider.   Interventions: 1:1 collaboration with [name], MD regarding development and update of comprehensive plan of care as evidenced by provider attestation and co-signature Inter-disciplinary care team collaboration (see longitudinal plan of care) Comprehensive medication review performed; medication list updated in electronic medical record  Hypertension (BP goal <140/90) -Controlled - per clinic readings -Amlodipine increased to current dose 11/2018. BP stable, controlled since then. -Current treatment: Amlodipine 10 mg - 1 tablet daily -Medications previously tried: HCTZ - unable to tolerate  -Current home readings: none reported, checks on occasion -Denies hypotensive/hypertensive symptoms -Educated on BP goals and benefits of medications for prevention of heart attack, stroke and kidney damage; -Some leg swelling, she props her feet up and it resolves. She has not started wearing compression stockings for her varicose veins. Unclear if swelling may be associated with high dose amlodipine. She has been on higher dose for > 1 year. Recommend swelling evaluation in person with next PCP visit. Pt reports its tolerable at this time.  -Counseled to monitor BP  at home with abnormal symptoms, document, and provide log at future appointments -Recommended to continue current medication  Hyperlipidemia: (LDL goal < 70) -Uncontrolled - LDL 179 -She started statin 3 days per week after last lipid panel, it has not been rechecked. She is tolerating this dose fine. -Current treatment: Atorvastatin 20 mg - 1 tablet every 3 days week -Medications previously tried: none reported  -Current exercise habits: She would like to lose some weight., but reports she limits time on her feet due to hip pain or leg swelling/varicose veins. Minimal activity.  -Educated on Cholesterol goals;  -Recommended INCREASE to atorvastatin every day (approved by PCP consult note). She will try daily for a little while and see how it goes.   MDD with anxiety (Goal: Improve symptoms as evidenced by PHQ-9, GAD-7) -Not ideally controlled -Reports very anxious lately with family stressors  -Current treatment  Sertraline 100 mg - 1 and 1/2 tablet daily in AM (pt taking differently: 1 tab daily) Clonazepam - takes 1 at bedtime every night  -Medications previously tried: none reported  -PHQ-9: Improved from 15 (08/2019) to 0 (07/2020) -GAD-7: Not done -Denies any falls, but does have weak knees/legs and feels unsteady at times.  -Reports sleeping very well -She is interested in resuming 1 and 1/2 tablets daily as prescribed due to anxiety. Agree. She denies time for counseling. Discussed elevated TSH and potential impact on mood.  -Recommended to resume 150 mg/day as prescribed. Recommend updating TSH.   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 We discussed risk/benefits. She does have some dribbling and urinary frequency. She would like to trial it PRN to see  if she noticed improvement in symptoms. -Current treatment  Oxybutynin 5 mg XL - 1 tablet daily at bedtime (not currently  taking) -Medications previously tried: none  -Recommended to use medication if symptoms are bothersome. Watch for side effects - dry mouth, constipation, confusion, sedation, blurred vision.   Hypothyroidism (Goal: Symptom control) -Uncontrolled - per labs TSH elevated 01/2021, levothyroxine dose was increased at that time. Next lab 07/2021.  -She is on generic levothyroxine. Takes it with other medications in morning. Does not separate. Mildly elevated TSH on last 2 checks, will offer to bring patient in sooner for labs to ensure this is within goal.  -Current treatment  Levothyroxine 75 mcg - 1 tablet daily before breakfast -Medications previously tried: none  -Recommended to continue current medication; CMA call patient to see if agreeable to check TSH or wait until routine labs in September.   Acid Reflux (Goal: Symptom Control) -Controlled - per pt report -Current treatment  Omeprazole 40 mg - 1 capsule daily as needed -Medications previously tried: none  -Recommended to continue current medication  Other - Allergies She started levocetirizine which replaced Allegra. Allegra not effective.  States she also takes Benadryl every night. Recommended cutting back on this slowly. Vitamin D - Pt affirms increasing to 2000 IU daily after last labs per PCP recommendation.   Patient Goals/Self-Care Activities Patient will:  - focus on medication adherence by starting adherence packaging   Follow Up Plan: Telephone follow up appointment with care management team member scheduled for: 30 days      Ms. Malmborg was given information about Chronic Care Management services today including:  CCM service includes personalized support from designated clinical staff supervised by her physician, including individualized plan of care and coordination with other care providers 24/7 contact phone numbers for assistance for urgent and routine care needs. Standard insurance, coinsurance, copays and  deductibles apply for chronic care management only during months in which we provide at least 20 minutes of these services. Most insurances cover these services at 100%, however patients may be responsible for any copay, coinsurance and/or deductible if applicable. This service may help you avoid the need for more expensive face-to-face services. Only one practitioner may furnish and bill the service in a calendar month. The patient may stop CCM services at any time (effective at the end of the month) by phone call to the office staff.  Patient agreed to services and verbal consent obtained.   Patient verbalizes understanding of instructions provided today and agrees to view in [place].   Verbal consent obtained for [facility] enhanced pharmacy services (medication synchronization, adherence packaging, delivery coordination). A medication sync plan was created to allow patient to get all medications delivered once every 30 to 90 days per patient preference. Patient understands they have freedom to choose pharmacy and clinical pharmacist will coordinate care between all prescribers and [facility].  [name], PharmD Clinical Pharmacist [facility] at [facility] [phone]

## 2021-06-21 NOTE — Telephone Encounter (Signed)
Patient decided to restart oxybutynin XL 5 mg 1 tablet at bedtime as needed (Previously reported not taking). If approved please send refill to [facility].   Her last prescription for sertraline 100 mg stated 1 and 1/2 tablet daily but recent refill was sent for 100 mg - 1 tablet daily. Pt has been more anxious lately and wanted to resume sertraline 150 mg/day as her prescription stated. See refill history below. If approved, please send higher dose, thanks.   Medication Dispense Information  SERTRALINE HCL 100 MG TAB  Sig: SMARTSIG:1.5 Tablet(s) By Mouth Daily  Dispensed: 03/10/2021 12:00 AM  Days supply: 90  Dispense Note: ORIGINAL [id number] 1 AND 1/2 TABLETS BY MOUTH DAILY  Quantity: 135 each  Refills remaining: 0  Pharmacy: [place], [place]  [place] [address]  Phone: [phone]  Fax: [phone]  Authorizing provider: [name]  Received from: [facility] (Fill History, Ambulatory)  Brand or Generic:  Generic

## 2021-06-22 MED ORDER — SERTRALINE HCL 100 MG PO TABS: 150.0000 mg | ORAL_TABLET | Freq: Every day | ORAL | 3 refills | Status: DC

## 2021-06-22 MED ORDER — OXYBUTYNIN CHLORIDE ER 5 MG PO TB24: 5.0000 mg | ORAL_TABLET | Freq: Every day | ORAL | 1 refills | Status: DC

## 2021-06-22 NOTE — Telephone Encounter (Signed)
ERx 

## 2021-06-22 NOTE — Addendum Note (Signed)
Addended by: [name] on: 06/22/2021 11:52 PM   Modules accepted: Orders

## 2021-06-25 ENCOUNTER — Telehealth: Payer: Self-pay

## 2021-06-25 NOTE — Chronic Care Management (AMB) (Addendum)
Chronic Care Management Pharmacy Assistant   Name: Donna Vargas  MRN: [id number] DOB: September 18, 1946  Reason for Encounter: Medication Adherence and Delivery Coordination   Recent office visits:  None since last CCM contact  Recent consult visits:  None since last CCM contact  Hospital visits:  None in previous 6 months  Medications: Outpatient Encounter Medications as of 06/25/2021  Medication Sig   amLODipine (NORVASC) 10 MG tablet Take 1 tablet (10 mg total) by mouth daily.   atorvastatin (LIPITOR) 20 MG tablet Take 1 tablet (20 mg total) by mouth daily.   Cholecalciferol (VITAMIN D3) 25 MCG (1000 UT) CAPS Take 2 capsules (2,000 Units total) by mouth daily.   clonazePAM (KLONOPIN) 0.5 MG tablet Take 1 tablet (0.5 mg total) by mouth at bedtime.   cyanocobalamin (V-R VITAMIN B-12) 500 MCG tablet Take 1 tablet (500 mcg total) by mouth daily.   levocetirizine (XYZAL) 5 MG tablet Take 5 mg by mouth every evening. Replaced Allegra, per patient report   levothyroxine (SYNTHROID) 75 MCG tablet Take 1 tablet (75 mcg total) by mouth daily before breakfast.   omeprazole (PRILOSEC) 40 MG capsule Take 1 capsule (40 mg total) by mouth daily as needed.   oxybutynin (DITROPAN-XL) 5 MG 24 hr tablet Take 1 tablet (5 mg total) by mouth at bedtime.   sertraline (ZOLOFT) 100 MG tablet Take 1.5 tablets (150 mg total) by mouth daily.   No facility-administered encounter medications on file as of 06/25/2021.   BP Readings from Last 3 Encounters:  02/17/21 118/72  08/19/20 124/72  06/12/20 128/70    Lab Results  Component Value Date   HGBA1C 6.2 02/10/2021      No OVs, Consults, or hospital visits since last care coordination call / Pharmacist visit.  Last adherence delivery date:  multiple due to med sync process  Patient is due for next adherence/sync date delivery on: 07/08/2021 (start adherence packs)  Spoke with patient on 06/26/21 reviewed medications and coordinated  delivery.  This delivery to include: Adherence Packaging  30 Days  Packs: Amlodipine 10 mg - 1 tablet breakfast  Atorvastatin 20 mg - 1 tablet daily at breakfast  Oxybutynin 5 mg XL - 1 tablet daily at bedtime Levothyroxine 75 mcg - 1 tablet daily breakfast Omeprazole 40 mg - 1 capsule daily breakfast  Clonazepam 0.'5mg'$  - 1 tablet bedtime  OTC Levocetirizine '5mg'$  - 1 tablet bedtime OTC Vitamin B12 500 mcg - 1 tablet breakfast OTC Vitamin D3 2000 IU - 1 tablet breakfast   Patient declined the following medications this month: Sertraline 100 mg- take 1 1/2 tablets at breakfast (patient still has over 1 month supply on hand). She is taking the sertraline 100 mg every day and 1/2 as needed.  Any concerns about your medications? No  How often do you forget or accidentally miss a dose? 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 No  Is patient in packaging - Yes she is just beginning with the packs delivery process   No refill request needed from PCP.  Confirmed delivery date of 07/08/2021, advised patient that pharmacy will contact them the morning of delivery.  Recent blood pressure readings are as follows:  06/26/2021  132/56  69P  9:00am   [name], CPP notified  Avel Sensor, [place] Assistant [phone]   Contacted to  review meds. Counseled on efficacy of sertraline - does not work PRN. If anxiety is not controlled on 100 mg, try 150 mg daily. For now, she would prefer to continue 100 mg daily.  [name], PharmD Clinical Pharmacist [facility] at [facility] [phone]

## 2021-06-26 ENCOUNTER — Telehealth: Payer: Self-pay

## 2021-06-26 MED ORDER — OMEPRAZOLE 40 MG PO CPDR: 40.0000 mg | DELAYED_RELEASE_CAPSULE | Freq: Every day | ORAL | 0 refills | Status: DC | PRN

## 2021-06-26 NOTE — Telephone Encounter (Signed)
-----   Message from [name], [facility] sent at 06/26/2021 11:40 AM EDT ----- Refill needed on omeprazole to [facility]. Patient is taking daily at this time.  [name], PharmD Clinical Pharmacist [facility] at [facility] [phone]

## 2021-06-26 NOTE — Telephone Encounter (Signed)
E-scribed refill 

## 2021-07-15 ENCOUNTER — Telehealth: Payer: Self-pay

## 2021-07-15 NOTE — Chronic Care Management (AMB) (Addendum)
    Chronic Care Management Pharmacy Assistant   Name: Donna Vargas  MRN: [id number] DOB: Mar 06, 1946 . Reason for Encounter: CCM Reminder Call   Conditions to be addressed/monitored: HTN and HLD   Medications: Outpatient Encounter Medications as of 07/15/2021  Medication Sig   amLODipine (NORVASC) 10 MG tablet Take 1 tablet (10 mg total) by mouth daily.   atorvastatin (LIPITOR) 20 MG tablet Take 1 tablet (20 mg total) by mouth daily.   Cholecalciferol (VITAMIN D3) 25 MCG (1000 UT) CAPS Take 2 capsules (2,000 Units total) by mouth daily.   clonazePAM (KLONOPIN) 0.5 MG tablet Take 1 tablet (0.5 mg total) by mouth at bedtime.   cyanocobalamin (V-R VITAMIN B-12) 500 MCG tablet Take 1 tablet (500 mcg total) by mouth daily.   levocetirizine (XYZAL) 5 MG tablet Take 5 mg by mouth every evening. Replaced Allegra, per patient report   levothyroxine (SYNTHROID) 75 MCG tablet Take 1 tablet (75 mcg total) by mouth daily before breakfast.   omeprazole (PRILOSEC) 40 MG capsule Take 1 capsule (40 mg total) by mouth daily as needed.   oxybutynin (DITROPAN-XL) 5 MG 24 hr tablet Take 1 tablet (5 mg total) by mouth at bedtime.   sertraline (ZOLOFT) 100 MG tablet Take 1.5 tablets (150 mg total) by mouth daily.   No facility-administered encounter medications on file as of 07/15/2021.   Nonnie Lebrun was contacted to remind her of her upcoming telephone visit with [name] on 07/22/2021 at 9:00am. Patient was reminded to have all medications, supplements and any blood glucose and blood pressure readings available for review at appointment.   Are you having any problems with your medications? No  Do you have any concerns you like to discuss with the pharmacist? No    Star Rating Drugs: Medication:  Last Fill: Day Supply Atorvastatin '20mg'$  07/07/21  30  [name], CPP notified  Avel Sensor, [place] Assistant [phone]  I have reviewed the care  management and care coordination activities outlined in this encounter and I am certifying that I agree with the content of this note. No further action required.  [name], PharmD Clinical Pharmacist [facility] at [facility] [phone]

## 2021-07-22 ENCOUNTER — Ambulatory Visit (INDEPENDENT_AMBULATORY_CARE_PROVIDER_SITE_OTHER): Payer: Medicare Other

## 2021-07-22 ENCOUNTER — Other Ambulatory Visit: Payer: Self-pay

## 2021-07-22 DIAGNOSIS — F331 Major depressive disorder, recurrent, moderate: Secondary | ICD-10-CM

## 2021-07-22 DIAGNOSIS — E785 Hyperlipidemia, unspecified: Secondary | ICD-10-CM

## 2021-07-22 NOTE — Patient Instructions (Signed)
Dear Donna Vargas,  Below is a summary of the goals we discussed during our follow up appointment on July 22, 2021. Please contact me anytime with questions or concerns.   Visit Information   Goals Addressed   None    Patient Care Plan: CCM Pharmacy Care Plan     Problem Identified: CHL AMB "PATIENT-SPECIFIC PROBLEM"      Long-Range Goal: Disease Management   Start Date: 06/09/2021  This Visit's Progress: On track  Priority: High  Note:    Current Barriers:  None identified   Pharmacist Clinical Goal(s):  Patient will contact provider office for questions/concerns as evidenced notation of same in electronic health record through collaboration with PharmD and provider.   Interventions: 1:1 collaboration with Donna Bush, MD regarding development and update of comprehensive plan of care as evidenced by provider attestation and co-signature Inter-disciplinary care team collaboration (see longitudinal plan of care) Comprehensive medication review performed; medication list updated in electronic medical record  Hypertension (BP goal <140/90) -Controlled, stable. Not discussed at today's visit.  -Amlodipine increased to current dose 11/2018. BP stable, controlled since then. -Current treatment: Amlodipine 10 mg - 1 tablet daily -Medications previously tried: HCTZ - unable to tolerate  -Recommended to continue current medication  Hyperlipidemia: (LDL goal < 70) -Uncontrolled - LDL 179 -She increased atorvastatin from 3 days per week to daily 07/08/21. Tolerating this well. Denies muscle cramps/pain. Lipid panel scheduled for 08/13/21. -Current treatment: Atorvastatin 20 mg - 1 tablet every day -Medications previously tried: none reported  -Current exercise habits: She would like to lose some weight., but reports she limits time on her feet due to hip pain or leg swelling/varicose veins. Minimal activity.  -Recommended to continue daily statin in pill packs.  MDD  with anxiety (Goal: Improve symptoms as evidenced by PHQ-9, GAD-7) -Controlled - per patient report -She did not increase sertraline to 150 mg/daily but is doing well on 100 mg daily. She has more help with caring for her mother which has relieved a lot of stress. She was able to stop Benadryl after last visit and is sleeping well with just clonazepam at bedtime. TSH will be rechecked 08/13/21 which can also impact mood. She denies falls or adverse effects from clonazepam. She has excess sertraline on hand from taking 1/day so we have not put in her packs yet. Will add once she runs out. -Current treatment  Sertraline 100 mg - 1 and 1/2 tablet daily in AM (pt taking differently: 1 tab daily) Clonazepam - takes 1 at bedtime every night  -Medications previously tried: none reported  -PHQ-9: Improved from 15 (08/2019) to 0 (07/2020).  -GAD-7: Not done -Recommended to continue current meds. Will update sertraline in med list - 100 mg/daily.   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 She has not noticed any improvement in urinary symptoms yet. Symptoms primarily stress related - sneezing, squatting, etc. She will continue to monitor and after 30 days, we may d/c if no improvement given potential risks. --Current treatment  Oxybutynin 5 mg XL - 1 tablet daily at bedtime (started daily 07/07/21) -Medications previously tried: none  -Recommended to continue oxybutynin daily for now. Watch for side effects - dry mouth, constipation, confusion, sedation, blurred vision. Note any improvement in symptoms.  Hypothyroidism (Goal: Symptom control) -Uncontrolled - per labs TSH elevated 01/2021, levothyroxine dose was increased at that time. Next lab 07/2021.  -She is  on generic levothyroxine. Takes it with other medications in morning. Does not separate. Mildly elevated TSH on last 2 checks.  -Current  treatment  Levothyroxine 75 mcg - 1 tablet daily before breakfast -Medications previously tried: none  -Recommended to continue current medication for now - Will await updated lab 9/14. She prefers not to change admin of levothyroxine if unnessecary. Its easier for her to take it with other morning meds.  Acid Reflux (Goal: Symptom Control) -Controlled - per pt report -Current treatment  Omeprazole 40 mg - 1 capsule daily (vial - as needed) -Medications previously tried: none  -She is taking PRN, works well this way. -Recommended to continue current medication  OTCs: Levocetirizine 5 mg daily Vitamin D3 2000 IU daily Vitamin B12 500 mcg daily   Patient Goals/Self-Care Activities Patient will:  - continue to take medications daily as prescribed   Follow Up Plan: Telephone follow up appointment with care management team member scheduled for: 6 months -CMA calls monthly for adherence and delivery coordination      Patient verbalizes understanding of instructions provided today and agrees to view in [place].   Donna Vargas, PharmD Clinical Pharmacist [facility] at [facility] [phone]

## 2021-07-22 NOTE — Progress Notes (Signed)
Chronic Care Management Pharmacy Note  07/22/21 Name:  Donna Vargas MRN:  [id number] DOB:  1946-08-01  Summary: 1 month follow up since initial visit. Pill packs working very well and have improved adherence. Pt tolerating increase atorvastatin to once daily very well. She did not increase her sertraline from 100 to 150 mg but is feeling less anxiety due to some help with her mother's care. She continues levothyroxine 75 mcg daily (takes with other meds - easier for her to remember this way). Awaiting updated labs - TSH, lipids. Labs scheduled for mid-Sept. She resumed daily oxybutynin 2 weeks ago for urinary symptoms and has not noticed much improvement thus far. We will re-eval benefit before adding to her next pill packs.  Recommendations: None - watch for improvement with oxybutynin; Follow up with PCP for labs as recommended.  Plan: PCP visit next month; CCM visit in 6 months; Monthly CMA call for [facility] adherence/delivery coordination. PCP to update sertraline dose - 100 mg daily to chart (no print).   Subjective: Donna Vargas is an 75 y.o. year old female who is a primary patient of [name], MD.  The CCM team was consulted for assistance with disease management and care coordination needs.    Engaged with patient by telephone for follow up visit in response to provider referral for pharmacy case management and/or care coordination services.   Consent to Services:  The patient was given information about Chronic Care Management services, agreed to services, and gave verbal consent prior to initiation of services.  Please see initial visit note for detailed documentation.   Patient Care Team: [name], MD as PCP - General (Family Medicine) [name], [facility] as Pharmacist (Pharmacist)  Recent office visits:  02/17/21 - Dr.Gutierrez, PCP - 6 month follow up - Increase Levothyroxine to 57mg take 1 tablet daily. Recommend increase Vitamin D to  2000 IU daily. Referral to ENT for tinnitus. Start compression stocking use for varicose veins. Slowly titrate atorvastatin as tolerated - to twice weekly to three times weekly and if tolerated, take daily. Follow up 6 months.   Recent consult visits:  None in previous 6 months   Hospital visits:  None in previous 6 months  Objective:  Lab Results  Component Value Date   CREATININE 0.76 08/13/2020   BUN 16 08/13/2020   GFR 74.83 08/13/2020   NA 141 08/13/2020   K 3.9 08/13/2020   CALCIUM 9.0 08/13/2020   CO2 28 08/13/2020   GLUCOSE 96 08/13/2020    Lab Results  Component Value Date/Time   HGBA1C 6.2 02/10/2021 09:26 AM   HGBA1C 6.2 08/13/2020 08:13 AM   GFR 74.83 08/13/2020 08:13 AM   GFR 70.74 08/02/2019 10:52 AM   MICROALBUR <0.7 08/13/2020 08:13 AM   MICROALBUR 2.3 (H) 08/02/2019 11:02 AM    Lab Results  Component Value Date   CHOL 258 (H) 02/10/2021   HDL 50.40 02/10/2021   LDLCALC 179 (H) 02/10/2021   TRIG 145.0 02/10/2021   CHOLHDL 5 02/10/2021    Hepatic Function Latest Ref Rng & Units 08/13/2020 08/02/2019 03/17/2018  Total Protein 6.0 - 8.3 g/dL 7.2 7.2 7.4  Albumin 3.5 - 5.2 g/dL 4.1 4.0 4.0  AST 0 - 37 U/L _0 ALT 0 - 35 U/L _1 Alk Phosphatase 39 - 117 U/L 67 78 72  Total Bilirubin 0.2 - 1.2 mg/dL 0.4 0.3 0.4  Bilirubin, Direct 0.0 - 0.3 mg/dL - - -  Lab Results  Component Value Date/Time   TSH 7.30 (H) 02/10/2021 09:26 AM   TSH 4.76 (H) 08/13/2020 08:13 AM   FREET4 0.59 (L) 02/10/2021 09:26 AM   FREET4 0.69 06/12/2020 11:06 AM    CBC Latest Ref Rng & Units 06/12/2020 01/30/2016 07/18/2014  WBC 4.0 - 10.5 K/uL 6.3 6.2 5.3  Hemoglobin 12.0 - 15.0 g/dL 13.2 13.2 12.8  Hematocrit 36.0 - 46.0 % 39.6 39.5 -  Platelets 150.0 - 400.0 K/uL 231.0 219.0 -    Lab Results  Component Value Date/Time   VD25OH 24.94 (L) 02/10/2021 09:26 AM   VD25OH 29.90 (L) 06/12/2020 11:06 AM    Clinical ASCVD: No  The 10-year ASCVD risk score Mikey Bussing DC Jr.,  et al., 2013) is: 14.2%   Values used to calculate the score:     Age: 27 years     Sex: Female     Is Non-Hispanic African American: No     Diabetic: No     Tobacco smoker: No     Systolic Blood Pressure: 220 mmHg     Is BP treated: Yes     HDL Cholesterol: 50.4 mg/dL     Total Cholesterol: 258 mg/dL    Depression screen [facility] 2/9 08/13/2020 08/08/2019 03/17/2018  Decreased Interest 0 1 0  Down, Depressed, Hopeless 0 1 0  PHQ - 2 Score 0 2 0  Altered sleeping 0 0 0  Tired, decreased energy 0 2 1  Change in appetite 0 3 0  Feeling bad or failure about yourself  0 2 0  Trouble concentrating 0 2 0  Moving slowly or fidgety/restless 0 3 1  Suicidal thoughts 0 1 0  PHQ-9 Score 0 15 2  Difficult doing work/chores Not difficult at all - Not difficult at all    Social History   Tobacco Use  Smoking Status Never  Smokeless Tobacco Never   BP Readings from Last 3 Encounters:  02/17/21 118/72  08/19/20 124/72  06/12/20 128/70   Pulse Readings from Last 3 Encounters:  02/17/21 64  08/19/20 72  06/12/20 70   Wt Readings from Last 3 Encounters:  02/17/21 151 lb 6 oz (68.7 kg)  08/19/20 149 lb (67.6 kg)  06/12/20 152 lb 5 oz (69.1 kg)   BMI Readings from Last 3 Encounters:  02/17/21 27.25 kg/m  08/19/20 26.82 kg/m  06/12/20 27.86 kg/m    Assessment/Interventions: Review of patient past medical history, allergies, medications, health status, including review of consultants reports, laboratory and other test data, was performed as part of comprehensive evaluation and provision of chronic care management services.   SDOH:  (Social Determinants of Health) assessments and interventions performed: Yes   SDOH Screenings   Alcohol Screen: Low Risk    Last Alcohol Screening Score (AUDIT): 0  Depression (PHQ2-9): Low Risk    PHQ-2 Score: 0  Financial Resource Strain: Low Risk    Difficulty of Paying Living Expenses: Not hard at all  Food Insecurity: No Food Insecurity    Worried About Charity fundraiser in the Last Year: Never true   Ran Out of Food in the Last Year: Never true  Housing: Low Risk    Last Housing Risk Score: 0  Physical Activity: Inactive   Days of Exercise per Week: 0 days   Minutes of Exercise per Session: 0 min  Social Connections: Not on file  Stress: No Stress Concern Present   Feeling of Stress : Not at all  Tobacco Use: Low  Risk    Smoking Tobacco Use: Never   Smokeless Tobacco Use: Never  Transportation Needs: No Transportation Needs   Lack of Transportation (Medical): No   Lack of Transportation (Non-Medical): No    CCM Care Plan  Allergies  Allergen Reactions   Hctz [Hydrochlorothiazide]     Blood came to the surface of her legs   Sulfa Antibiotics Rash    Medications Reviewed Today     Reviewed by [name], [facility] (Pharmacist) on 07/22/21 at [phone]  Med List Status: <None>   Medication Order Taking? Sig Documenting Provider Last Dose Status Informant  amLODipine (NORVASC) 10 MG tablet [id number] Yes Take 1 tablet (10 mg total) by mouth daily. [name], MD Taking Active   atorvastatin (LIPITOR) 20 MG tablet [id number] Yes Take 1 tablet (20 mg total) by mouth daily. [name], MD Taking Active   Cholecalciferol (VITAMIN D3) 25 MCG (1000 UT) CAPS [id number] Yes Take 2 capsules (2,000 Units total) by mouth daily. [name], MD Taking Active   clonazePAM [facility]) 0.5 MG tablet [id number] Yes Take 1 tablet (0.5 mg total) by mouth at bedtime. [name], MD Taking Active   cyanocobalamin (V-R VITAMIN B-12) 500 MCG tablet [id number] Yes Take 1 tablet (500 mcg total) by mouth daily. [name], MD Taking Active   levocetirizine (XYZAL) 5 MG tablet [id number] Yes Take 5 mg by mouth every evening. Replaced Allegra, per patient report [provider] Taking Active Self  levothyroxine (SYNTHROID) 75 MCG tablet [id number] Yes Take 1 tablet (75 mcg total) by mouth daily before breakfast.   Patient taking differently: Take 75 mcg by mouth daily. Takes with other meds at breakfast   [name], MD Taking Active   omeprazole (PRILOSEC) 40 MG capsule [id number] Yes Take 1 capsule (40 mg total) by mouth daily as needed. [name], MD Taking Active   oxybutynin (DITROPAN-XL) 5 MG 24 hr tablet [id number] Yes Take 1 tablet (5 mg total) by mouth at bedtime. [name], MD Taking Active   sertraline (ZOLOFT) 100 MG tablet [id number] Yes Take 1.5 tablets (150 mg total) by mouth daily.  Patient taking differently: Take 100 mg by mouth daily. 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   Palpitations 09/06/2018   Left lateral epicondylitis 12/06/2017   Hypothyroidism 08/18/2017   Hearing loss due to cerumen impaction 07/09/2017   Abnormal drug screen 05/30/2016   HLD (hyperlipidemia) 10/17/2015   Medicare annual wellness visit, subsequent 09/11/2014   Advanced care planning/counseling discussion 09/11/2014   Right thyroid nodule 09/11/2014   Pain in lateral portion of right knee 08/31/2014   Surgical menopause 08/21/2014   Perennial allergic rhinitis    MDD (major depressive disorder), recurrent episode, moderate (HCC)    Arthritis    Hypertension    Prediabetes 06/30/2014    Immunization History  Administered Date(s) Administered   PFIZER(Purple Top)SARS-COV-2 Vaccination  03/31/2020, 04/26/2020   Pneumococcal Conjugate-13 02/15/2017   Pneumococcal Polysaccharide-23 03/17/2018   Tdap 06/30/2014    Conditions to be addressed/monitored:  Hypertension, Hyperlipidemia, GERD, and Hypothyroidism  Care Plan : [place]  Updates made by [name], [place] since 07/22/2021 12:00 AM     Problem: CHL AMB "PATIENT-SPECIFIC PROBLEM"      Long-Range Goal: Disease Management   Start Date: 06/09/2021  This Visit's Progress: On track  Priority: High  Note:    Current Barriers:  None identified   Pharmacist Clinical Goal(s):  Patient will contact provider office for questions/concerns as evidenced notation of same in electronic health record through collaboration with PharmD and provider.   Interventions: 1:1 collaboration with [name], MD regarding development and update of comprehensive plan of care as evidenced by provider attestation and co-signature Inter-disciplinary care team collaboration (see longitudinal plan of care) Comprehensive medication review performed; medication list updated in electronic medical record  Hypertension (BP goal <140/90) -Controlled, stable. Not discussed at today's visit.  -Amlodipine increased to current dose 11/2018. BP stable, controlled since then. -Current treatment: Amlodipine 10 mg - 1 tablet daily -Medications previously tried: HCTZ - unable to tolerate  -Recommended to continue current medication  Hyperlipidemia: (LDL goal < 70) -Uncontrolled - LDL 179 -She increased atorvastatin from 3 days per week to daily 07/08/21. Tolerating this well. Denies muscle cramps/pain. Lipid panel scheduled for 08/13/21. -Current treatment: Atorvastatin 20 mg - 1 tablet every day -Medications previously tried: none reported  -Current exercise habits: She would like to lose some weight., but reports she limits time on her feet due to hip pain or leg swelling/varicose veins. Minimal activity.  -Recommended to  continue daily statin in pill packs.  MDD with anxiety (Goal: Improve symptoms as evidenced by PHQ-9, GAD-7) -Controlled - per patient report -She did not increase sertraline to 150 mg/daily but is doing well on 100 mg daily. She has more help with caring for her mother which has relieved a lot of stress. She was able to stop Benadryl after last visit and is sleeping well with just clonazepam at bedtime. TSH will be rechecked 08/13/21 which can also impact mood. She denies falls or adverse effects from clonazepam. She has excess sertraline on hand from taking 1/day so we have not put in her packs yet. Will add once she runs out. -Current treatment  Sertraline 100 mg - 1 and 1/2 tablet daily in AM (pt taking differently: 1 tab daily) Clonazepam - takes 1 at bedtime every night  -Medications previously tried: none reported  -PHQ-9: Improved from 15 (08/2019) to 0 (07/2020).  -GAD-7: Not done -Recommended to continue current meds. Will update sertraline in med list - 100 mg/daily.   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 She has not noticed any improvement in urinary symptoms yet. Symptoms primarily stress related - sneezing, squatting, etc. She will continue to monitor and after 30 days, we may d/c if no improvement given potential risks. --Current treatment  Oxybutynin 5 mg XL - 1 tablet daily at bedtime (started daily 07/07/21) -Medications previously tried: none  -Recommended to continue oxybutynin daily for now. Watch for side effects - dry mouth, constipation, confusion, sedation, blurred vision. Note any improvement in symptoms.  Hypothyroidism (Goal: Symptom control) -Uncontrolled - per labs TSH elevated 01/2021, levothyroxine dose was increased at that time. Next lab 07/2021.  -She is on generic levothyroxine. Takes it  with other medications in morning. Does not separate. Mildly  elevated TSH on last 2 checks.  -Current treatment  Levothyroxine 75 mcg - 1 tablet daily before breakfast -Medications previously tried: none  -Recommended to continue current medication for now - Will await updated lab 9/14. She prefers not to change admin of levothyroxine if unnessecary. Its easier for her to take it with other morning meds.  Acid Reflux (Goal: Symptom Control) -Controlled - per pt report -Current treatment  Omeprazole 40 mg - 1 capsule daily (vial - as needed) -Medications previously tried: none  -She is taking PRN, works well this way. -Recommended to continue current medication  OTCs: Levocetirizine 5 mg daily Vitamin D3 2000 IU daily Vitamin B12 500 mcg daily   Patient Goals/Self-Care Activities Patient will:  - continue to take medications daily as prescribed   Follow Up Plan: Telephone follow up appointment with care management team member scheduled for: 6 months -CMA calls monthly for adherence and delivery coordination     Medication Assistance: None required.  Patient affirms current coverage meets needs.  Compliance/Adherence/Medication fill history: Care Gaps: Shingrix  COVID-19 booster  Star-Rating Drugs: Medication:                Last Fill:         Day Supply Atorvastatin 25m       07/07/21            90  Patient's preferred pharmacy is:  [facility] [place] [address] [id number]: [phone]: [phone] Uses pill box? No - uses pillpacks Pt endorses 100% compliance to daily medications  Pill packs working very well, she is no longer missing any doses. Saving her a lot of time. Her current pill packs run from Aug 11 to Sept 9, 2022.  Care Plan and Follow Up Patient Decision:  Patient agrees to Care Plan and Follow-up.  MDebbora Dus PharmD Clinical Pharmacist [phone]

## 2021-07-23 ENCOUNTER — Other Ambulatory Visit: Payer: Self-pay | Admitting: Family Medicine

## 2021-07-24 NOTE — Telephone Encounter (Signed)
ERx 

## 2021-07-24 NOTE — Telephone Encounter (Signed)
Last OV - 02/17/2021  Next OV - 08/20/2021  Last Filled - 06/12/2021

## 2021-07-25 ENCOUNTER — Telehealth: Payer: Self-pay

## 2021-07-25 NOTE — Chronic Care Management (AMB) (Addendum)
Chronic Care Management Pharmacy Assistant   Name: Donna Vargas  MRN: [id number] DOB: 1945/12/04  Reason for Encounter: Medication Adherence and Delivery Coordination    Recent office visits:  None since last CCM contact  Recent consult visits:  None since last CCM contact  Hospital visits:  None in previous 6 months  Medications: Outpatient Encounter Medications as of 07/25/2021  Medication Sig   clonazePAM (KLONOPIN) 0.5 MG tablet TAKE ONE TABLET BY MOUTH EVERYDAY AT BEDTIME   amLODipine (NORVASC) 10 MG tablet Take 1 tablet (10 mg total) by mouth daily.   atorvastatin (LIPITOR) 20 MG tablet Take 1 tablet (20 mg total) by mouth daily.   Cholecalciferol (VITAMIN D3) 25 MCG (1000 UT) CAPS Take 2 capsules (2,000 Units total) by mouth daily.   cyanocobalamin (V-R VITAMIN B-12) 500 MCG tablet Take 1 tablet (500 mcg total) by mouth daily.   levocetirizine (XYZAL) 5 MG tablet Take 5 mg by mouth every evening. Replaced Allegra, per patient report   levothyroxine (SYNTHROID) 75 MCG tablet Take 1 tablet (75 mcg total) by mouth daily before breakfast. (Patient taking differently: Take 75 mcg by mouth daily. Takes with other meds at breakfast)   omeprazole (PRILOSEC) 40 MG capsule Take 1 capsule (40 mg total) by mouth daily as needed.   oxybutynin (DITROPAN-XL) 5 MG 24 hr tablet Take 1 tablet (5 mg total) by mouth at bedtime.   sertraline (ZOLOFT) 100 MG tablet Take 1.5 tablets (150 mg total) by mouth daily. (Patient taking differently: Take 100 mg by mouth daily. Taking 1 tablet daily (07/22/21))   No facility-administered encounter medications on file as of 07/25/2021.   BP Readings from Last 3 Encounters:  02/17/21 118/72  08/19/20 124/72  06/12/20 128/70    Lab Results  Component Value Date   HGBA1C 6.2 02/10/2021      No OVs, Consults, or hospital visits since last care coordination call / Pharmacist visit.   Last adherence delivery date:07/08/2021      Patient is  due for next adherence delivery on: 08/06/2021  Spoke with patient on 07/25/21 reviewed medications and coordinated delivery.  This delivery to include: Adherence Packaging  30 Days  Packs: Amlodipine 10 mg - 1 tablet breakfast  Atorvastatin 20 mg - 1 tablet daily at breakfast  Oxybutynin 5 mg XL - 1 tablet daily at bedtime Levothyroxine 75 mcg - 1 tablet daily breakfast Omeprazole 40 mg - 1 capsule daily breakfast  Clonazepam 0.'5mg'$  - 1 tablet bedtime  OTC Levocetirizine '5mg'$  - 1 tablet bedtime OTC Vitamin B12 500 mcg - 1 tablet breakfast OTC Vitamin D3 2000 IU - 1 tablet breakfast  VIAL medications: None  Patient declined the following medications this month: Sertraline 100 mg- take 1 tablet  breakfast (patient still has over 1 month supply on hand). She is taking the sertraline 100 mg 1 tablet daily.  Any concerns about your medications? No  How often do you forget or accidentally miss a dose? Rarely  Do you use a pillbox? No  Is patient in packaging Yes  What is the date on your next pill pack? Was not home to verify  Any concerns or issues with your packaging? None - The patient reports she is very pleased with the Adherence Packaging Process and with [facility]   Refills requested from PCP include: Clonazepam 0.'5mg'$  - requested via [facility]  Confirmed delivery date of 08/06/2021, advised patient that pharmacy will contact them the morning of delivery.  Recent blood  pressure readings are as follows:  Last remembers from 2 days ago, morning before medications -->  121/76  66P  [name], CPP notified  Avel Sensor, [place] Assistant [phone]  I have reviewed the care management and care coordination activities outlined in this encounter and I am certifying that I agree with the content of this note. No further action required.  [name], PharmD Clinical Pharmacist [facility] [phone]

## 2021-08-10 ENCOUNTER — Other Ambulatory Visit: Payer: Self-pay | Admitting: Family Medicine

## 2021-08-10 DIAGNOSIS — E785 Hyperlipidemia, unspecified: Secondary | ICD-10-CM

## 2021-08-10 DIAGNOSIS — I10 Essential (primary) hypertension: Secondary | ICD-10-CM

## 2021-08-10 DIAGNOSIS — E039 Hypothyroidism, unspecified: Secondary | ICD-10-CM

## 2021-08-10 DIAGNOSIS — E538 Deficiency of other specified B group vitamins: Secondary | ICD-10-CM

## 2021-08-10 DIAGNOSIS — E559 Vitamin D deficiency, unspecified: Secondary | ICD-10-CM

## 2021-08-10 DIAGNOSIS — R7303 Prediabetes: Secondary | ICD-10-CM

## 2021-08-13 ENCOUNTER — Other Ambulatory Visit (INDEPENDENT_AMBULATORY_CARE_PROVIDER_SITE_OTHER): Payer: Medicare Other

## 2021-08-13 ENCOUNTER — Other Ambulatory Visit: Payer: Self-pay

## 2021-08-13 ENCOUNTER — Other Ambulatory Visit: Payer: Medicare Other

## 2021-08-13 ENCOUNTER — Ambulatory Visit: Payer: Medicare Other

## 2021-08-13 DIAGNOSIS — E039 Hypothyroidism, unspecified: Secondary | ICD-10-CM

## 2021-08-13 DIAGNOSIS — E538 Deficiency of other specified B group vitamins: Secondary | ICD-10-CM | POA: Diagnosis not present

## 2021-08-13 DIAGNOSIS — E785 Hyperlipidemia, unspecified: Secondary | ICD-10-CM | POA: Diagnosis not present

## 2021-08-13 DIAGNOSIS — I10 Essential (primary) hypertension: Secondary | ICD-10-CM | POA: Diagnosis not present

## 2021-08-13 DIAGNOSIS — E559 Vitamin D deficiency, unspecified: Secondary | ICD-10-CM | POA: Diagnosis not present

## 2021-08-13 DIAGNOSIS — R7303 Prediabetes: Secondary | ICD-10-CM

## 2021-08-13 LAB — LIPID PANEL
Cholesterol: 175 mg/dL (ref 0–200)
HDL: 51 mg/dL (ref >39.00)
LDL Cholesterol: 103 mg/dL — ABNORMAL HIGH (ref 0–99)
NonHDL: 123.76
Total CHOL/HDL Ratio: 3
Triglycerides: 103 mg/dL (ref 0.0–149.0)
VLDL: 20.6 mg/dL (ref 0.0–40.0)

## 2021-08-13 LAB — COMPREHENSIVE METABOLIC PANEL
ALT: 12 U/L (ref 0–35)
AST: 14 U/L (ref 0–37)
Albumin: 4 g/dL (ref 3.5–5.2)
Alkaline Phosphatase: 78 U/L (ref 39–117)
BUN: 17 mg/dL (ref 6–23)
CO2: 28 mEq/L (ref 19–32)
Calcium: 9.1 mg/dL (ref 8.4–10.5)
Chloride: 106 mEq/L (ref 96–112)
Creatinine, Ser: 0.76 mg/dL (ref 0.40–1.20)
GFR: 78.03 mL/min (ref >60.00)
Glucose, Bld: 94 mg/dL (ref 70–99)
Potassium: 3.9 mEq/L (ref 3.5–5.1)
Sodium: 142 mEq/L (ref 135–145)
Total Bilirubin: 0.3 mg/dL (ref 0.2–1.2)
Total Protein: 7.2 g/dL (ref 6.0–8.3)

## 2021-08-13 LAB — VITAMIN B12: Vitamin B-12: 762 pg/mL (ref 211–911)

## 2021-08-13 LAB — MICROALBUMIN / CREATININE URINE RATIO
Creatinine,U: 102.3 mg/dL
Microalb Creat Ratio: 0.7 mg/g (ref 0.0–30.0)
Microalb, Ur: 0.7 mg/dL (ref 0.0–1.9)

## 2021-08-13 LAB — T4, FREE: Free T4: 0.66 ng/dL (ref 0.60–1.60)

## 2021-08-13 LAB — HEMOGLOBIN A1C: Hgb A1c MFr Bld: 6.3 % (ref 4.6–6.5)

## 2021-08-13 LAB — VITAMIN D 25 HYDROXY (VIT D DEFICIENCY, FRACTURES): VITD: 30.62 ng/mL (ref 30.00–100.00)

## 2021-08-13 LAB — TSH: TSH: 2.32 u[IU]/mL (ref 0.35–5.50)

## 2021-08-20 ENCOUNTER — Encounter: Payer: Self-pay | Admitting: Family Medicine

## 2021-08-20 ENCOUNTER — Other Ambulatory Visit: Payer: Self-pay

## 2021-08-20 ENCOUNTER — Ambulatory Visit (INDEPENDENT_AMBULATORY_CARE_PROVIDER_SITE_OTHER): Payer: Medicare Other | Admitting: Family Medicine

## 2021-08-20 VITALS — BP 137/78 | HR 74 | Temp 97.8°F | Ht 62.25 in | Wt 157.6 lb

## 2021-08-20 DIAGNOSIS — M5442 Lumbago with sciatica, left side: Secondary | ICD-10-CM

## 2021-08-20 DIAGNOSIS — F331 Major depressive disorder, recurrent, moderate: Secondary | ICD-10-CM

## 2021-08-20 DIAGNOSIS — I10 Essential (primary) hypertension: Secondary | ICD-10-CM | POA: Diagnosis not present

## 2021-08-20 DIAGNOSIS — E538 Deficiency of other specified B group vitamins: Secondary | ICD-10-CM | POA: Diagnosis not present

## 2021-08-20 DIAGNOSIS — M85859 Other specified disorders of bone density and structure, unspecified thigh: Secondary | ICD-10-CM

## 2021-08-20 DIAGNOSIS — K219 Gastro-esophageal reflux disease without esophagitis: Secondary | ICD-10-CM | POA: Diagnosis not present

## 2021-08-20 DIAGNOSIS — R7303 Prediabetes: Secondary | ICD-10-CM

## 2021-08-20 DIAGNOSIS — E785 Hyperlipidemia, unspecified: Secondary | ICD-10-CM

## 2021-08-20 DIAGNOSIS — E559 Vitamin D deficiency, unspecified: Secondary | ICD-10-CM | POA: Diagnosis not present

## 2021-08-20 DIAGNOSIS — M5441 Lumbago with sciatica, right side: Secondary | ICD-10-CM

## 2021-08-20 DIAGNOSIS — G8929 Other chronic pain: Secondary | ICD-10-CM

## 2021-08-20 DIAGNOSIS — Z7189 Other specified counseling: Secondary | ICD-10-CM | POA: Diagnosis not present

## 2021-08-20 DIAGNOSIS — E039 Hypothyroidism, unspecified: Secondary | ICD-10-CM | POA: Diagnosis not present

## 2021-08-20 DIAGNOSIS — N3946 Mixed incontinence: Secondary | ICD-10-CM | POA: Diagnosis not present

## 2021-08-20 DIAGNOSIS — Z0000 Encounter for general adult medical examination without abnormal findings: Secondary | ICD-10-CM | POA: Diagnosis not present

## 2021-08-20 MED ORDER — ATORVASTATIN CALCIUM 20 MG PO TABS: 20.0000 mg | ORAL_TABLET | Freq: Every day | ORAL | 3 refills | Status: DC

## 2021-08-20 MED ORDER — OXYBUTYNIN CHLORIDE ER 5 MG PO TB24: 5.0000 mg | ORAL_TABLET | Freq: Every day | ORAL | 3 refills | Status: DC

## 2021-08-20 MED ORDER — OMEPRAZOLE 40 MG PO CPDR: 40.0000 mg | DELAYED_RELEASE_CAPSULE | Freq: Every day | ORAL | 3 refills | Status: DC | PRN

## 2021-08-20 MED ORDER — AMLODIPINE BESYLATE 10 MG PO TABS: 10.0000 mg | ORAL_TABLET | Freq: Every day | ORAL | 3 refills | Status: DC

## 2021-08-20 MED ORDER — LEVOTHYROXINE SODIUM 75 MCG PO TABS: 75.0000 ug | ORAL_TABLET | Freq: Every day | ORAL | 3 refills | Status: DC

## 2021-08-20 NOTE — Assessment & Plan Note (Signed)
Continue vit D replacement.  

## 2021-08-20 NOTE — Assessment & Plan Note (Signed)

## 2021-08-20 NOTE — Assessment & Plan Note (Signed)
Stable period on oxybutynin XL 5mg  daily.

## 2021-08-20 NOTE — Patient Instructions (Addendum)
We will request latest colonoscopy from Dr Watt Climes at [place].  Call [facility] to ask when next colonoscopy is due.  Let me know if you need new referral.  Consider getting shingrix vaccine through the pharmacy.  Increase water intake by 1-2 glasses /day.  You are doing well today Return as needed or in 6 months for follow up visit.   Health Maintenance After Age 73 After age 74, you are at a higher risk for certain long-term diseases and infections as well as injuries from falls. Falls are a major cause of broken bones and head injuries in people who are older than age 16. Getting regular preventive care can help to keep you healthy and well. Preventive care includes getting regular testing and making lifestyle changes as recommended by your health care provider. Talk with your health care provider about: Which screenings and tests you should have. A screening is a test that checks for a disease when you have no symptoms. A diet and exercise plan that is right for you. What should I know about screenings and tests to prevent falls? Screening and testing are the best ways to find a health problem early. Early diagnosis and treatment give you the best chance of managing medical conditions that are common after age 66. Certain conditions and lifestyle choices may make you more likely to have a fall. Your health care provider may recommend: Regular vision checks. Poor vision and conditions such as cataracts can make you more likely to have a fall. If you wear glasses, make sure to get your prescription updated if your vision changes. Medicine review. Work with your health care provider to regularly review all of the medicines you are taking, including over-the-counter medicines. Ask your health care provider about any side effects that may make you more likely to have a fall. Tell your health care provider if any medicines that you take make you feel dizzy or sleepy. Osteoporosis screening. Osteoporosis is a  condition that causes the bones to get weaker. This can make the bones weak and cause them to break more easily. Blood pressure screening. Blood pressure changes and medicines to control blood pressure can make you feel dizzy. Strength and balance checks. Your health care provider may recommend certain tests to check your strength and balance while standing, walking, or changing positions. Foot health exam. Foot pain and numbness, as well as not wearing proper footwear, can make you more likely to have a fall. Depression screening. You may be more likely to have a fall if you have a fear of falling, feel emotionally low, or feel unable to do activities that you used to do. Alcohol use screening. Using too much alcohol can affect your balance and may make you more likely to have a fall. What actions can I take to lower my risk of falls? General instructions Talk with your health care provider about your risks for falling. Tell your health care provider if: You fall. Be sure to tell your health care provider about all falls, even ones that seem minor. You feel dizzy, sleepy, or off-balance. Take over-the-counter and prescription medicines only as told by your health care provider. These include any supplements. Eat a healthy diet and maintain a healthy weight. A healthy diet includes low-fat dairy products, low-fat (lean) meats, and fiber from whole grains, beans, and lots of fruits and vegetables. Home safety Remove any tripping hazards, such as rugs, cords, and clutter. Install safety equipment such as grab bars in bathrooms and  safety rails on stairs. Keep rooms and walkways well-lit. Activity  Follow a regular exercise program to stay fit. This will help you maintain your balance. Ask your health care provider what types of exercise are appropriate for you. If you need a cane or walker, use it as recommended by your health care provider. Wear supportive shoes that have nonskid  soles. Lifestyle Do not drink alcohol if your health care provider tells you not to drink. If you drink alcohol, limit how much you have: 0-1 drink a day for women. 0-2 drinks a day for men. Be aware of how much alcohol is in your drink. In the [place] one drink equals one typical bottle of beer (12 oz), one-half glass of wine (5 oz), or one shot of hard liquor (1 oz). Do not use any products that contain nicotine or tobacco, such as cigarettes and e-cigarettes. If you need help quitting, ask your health care provider. Summary Having a healthy lifestyle and getting preventive care can help to protect your health and wellness after age 33. Screening and testing are the best way to find a health problem early and help you avoid having a fall. Early diagnosis and treatment give you the best chance for managing medical conditions that are more common for people who are older than age 69. Falls are a major cause of broken bones and head injuries in people who are older than age 42. Take precautions to prevent a fall at home. Work with your health care provider to learn what changes you can make to improve your health and wellness and to prevent falls. This information is not intended to replace advice given to you by your health care provider. Make sure you discuss any questions you have with your health care provider. Document Revised: 01/24/2021 Document Reviewed: 11/01/2020 Elsevier Patient Education  2022 [employer].

## 2021-08-20 NOTE — Assessment & Plan Note (Signed)
Chronic, stable period on sertraline 150mg daily  

## 2021-08-20 NOTE — Assessment & Plan Note (Signed)
Continue B12 524mcg daily with good effect.

## 2021-08-20 NOTE — Assessment & Plan Note (Signed)
Chronic, stable. Continue amlodipine daily.

## 2021-08-20 NOTE — Assessment & Plan Note (Signed)
Advanced directive: living will scanned 01/2015. Working on [employer] form. Would want husband to be HCPOA. Has not filled out yet.
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
## 2021-08-20 NOTE — Assessment & Plan Note (Signed)
Continues PRN PPI, takes pretty regularly.

## 2021-08-20 NOTE — Assessment & Plan Note (Signed)
Chronic, stable period on higher levothyroxine dose - continue.

## 2021-08-20 NOTE — Assessment & Plan Note (Signed)
Chronic, significant improvement since addition of atorvastatin - continue this. The 10-year ASCVD risk score (Arnett DK, et al., 2019) is: 17.2%   Values used to calculate the score:     Age: 75 years     Sex: Female     Is Non-Hispanic African American: No     Diabetic: No     Tobacco smoker: No     Systolic Blood Pressure: 407 mmHg     Is BP treated: Yes     HDL Cholesterol: 51 mg/dL     Total Cholesterol: 175 mg/dL

## 2021-08-20 NOTE — Assessment & Plan Note (Signed)
Encouraged limiting added sugars in diet.  

## 2021-08-20 NOTE — Assessment & Plan Note (Signed)
Ongoing, she plans to try inversion table she has at home. Discussed update [place] if ongoing to consider return to PT.

## 2021-08-25 ENCOUNTER — Telehealth: Payer: Self-pay

## 2021-08-25 NOTE — Chronic Care Management (AMB) (Addendum)
Chronic Care Management Pharmacy Assistant   Name: Donna Vargas  MRN: [id number] DOB: 1946/05/08   Reason for Encounter: Medication Adherence and Delivery Coordination   Recent office visits:  08/20/21-PCP-Patient presented for AWV.Check on routine colonoscopy. Increase water intake. Consider Shingrix. Follow up 6 months.  Recent consult visits:  None since last CCM contact  Hospital visits:  None in previous 6 months  Medications: Outpatient Encounter Medications as of 08/25/2021  Medication Sig   amLODipine (NORVASC) 10 MG tablet Take 1 tablet (10 mg total) by mouth daily.   atorvastatin (LIPITOR) 20 MG tablet Take 1 tablet (20 mg total) by mouth daily.   Cholecalciferol (VITAMIN D3) 25 MCG (1000 UT) CAPS Take 2 capsules (2,000 Units total) by mouth daily.   clonazePAM (KLONOPIN) 0.5 MG tablet TAKE ONE TABLET BY MOUTH EVERYDAY AT BEDTIME   cyanocobalamin (V-R VITAMIN B-12) 500 MCG tablet Take 1 tablet (500 mcg total) by mouth daily.   levocetirizine (XYZAL) 5 MG tablet Take 5 mg by mouth every evening. Replaced Allegra, per patient report   levothyroxine (SYNTHROID) 75 MCG tablet Take 1 tablet (75 mcg total) by mouth daily before breakfast.   omeprazole (PRILOSEC) 40 MG capsule Take 1 capsule (40 mg total) by mouth daily as needed.   oxybutynin (DITROPAN-XL) 5 MG 24 hr tablet Take 1 tablet (5 mg total) by mouth at bedtime.   sertraline (ZOLOFT) 100 MG tablet Take 1.5 tablets (150 mg total) by mouth daily. (Patient taking differently: Take 100 mg by mouth daily. Taking 1 tablet daily (07/22/21))   No facility-administered encounter medications on file as of 08/25/2021.    BP Readings from Last 3 Encounters:  08/20/21 137/78  02/17/21 118/72  08/19/20 124/72    Lab Results  Component Value Date   HGBA1C 6.3 08/13/2021     Recent OV, Consult or Hospital visit:     PCP-08/20/21 No medication changes indicated   Last adherence delivery date:08/06/21      Patient  is due for next adherence delivery on: 09/04/21  Spoke with patient on 08/25/21 reviewed medications and coordinated delivery.  This delivery to include: Adherence Packaging  30 Days  Packs: Amlodipine 10 mg - 1 tablet breakfast  Atorvastatin 20 mg - 1 tablet daily at breakfast  Oxybutynin 5 mg XL - 1 tablet daily at bedtime Levothyroxine 75 mcg - 1 tablet daily breakfast Clonazepam 0.5mg  - 1 tablet bedtime  OTC Levocetirizine 5mg  - 1 tablet bedtime OTC Vitamin B12 500 mcg - 1 tablet breakfast OTC Vitamin D3 2000 IU - 1 tablet breakfast  VIAL medications: None requested   Patient declined the following medications this month: Omeprazole 40 mg - Patient still has 2 bottles full on hand Sertraline 100 mg - take 1 tablet daily (patient still has supply on hand will check next month)  Any concerns about your medications? No  How often do you forget or accidentally miss a dose? Never  Do you use a pillbox? No  Is patient in packaging Yes  If yes  What is the date on your next pill pack? 08/25/21  Any concerns or issues with your packaging? Patient very happy with the service.   No refill request needed.  Confirmed delivery date of 09/04/21, advised patient that pharmacy will contact them the morning of delivery.  Recent blood pressure readings are as follows:  138/78  74-P in office setting last week, patient states she feels very well  Annual wellness visit in last year? Yes  08/20/21 Most Recent BP reading:  137/78  74-P  08/20/21  Donna Vargas, CPP notified  Donna Vargas, [place] Assistant [phone]  I have reviewed the care management and care coordination activities outlined in this encounter and I am certifying that I agree with the content of this note. No further action required.  Donna Vargas, PharmD Clinical Pharmacist [facility] [phone]

## 2021-08-26 DIAGNOSIS — D225 Melanocytic nevi of trunk: Secondary | ICD-10-CM | POA: Diagnosis not present

## 2021-08-26 DIAGNOSIS — L814 Other melanin hyperpigmentation: Secondary | ICD-10-CM | POA: Diagnosis not present

## 2021-08-26 DIAGNOSIS — Z85828 Personal history of other malignant neoplasm of skin: Secondary | ICD-10-CM | POA: Diagnosis not present

## 2021-08-26 DIAGNOSIS — L821 Other seborrheic keratosis: Secondary | ICD-10-CM | POA: Diagnosis not present

## 2021-08-26 DIAGNOSIS — Z08 Encounter for follow-up examination after completed treatment for malignant neoplasm: Secondary | ICD-10-CM | POA: Diagnosis not present

## 2021-09-16 DIAGNOSIS — M545 Low back pain, unspecified: Secondary | ICD-10-CM | POA: Diagnosis not present

## 2021-09-16 DIAGNOSIS — M5416 Radiculopathy, lumbar region: Secondary | ICD-10-CM | POA: Diagnosis not present

## 2021-09-23 ENCOUNTER — Other Ambulatory Visit: Payer: Self-pay | Admitting: Family Medicine

## 2021-09-23 NOTE — Telephone Encounter (Signed)
ERx 

## 2021-09-25 ENCOUNTER — Telehealth: Payer: Self-pay

## 2021-09-25 NOTE — Progress Notes (Addendum)
Chronic Care Management Pharmacy Assistant   Name: Dalena Plantz  MRN: [id number] DOB: [birth year]  Reason for Encounter: CCM (Medication Adherence and Delivery Coordination)   Recent office visits:  None since last CCM contact  Recent consult visits:  None since last CCM contact  Hospital visits:  None in previous 6 months  Medications: Outpatient Encounter Medications as of 09/25/2021  Medication Sig   amLODipine (NORVASC) 10 MG tablet Take 1 tablet (10 mg total) by mouth daily.   atorvastatin (LIPITOR) 20 MG tablet Take 1 tablet (20 mg total) by mouth daily.   Cholecalciferol (VITAMIN D3) 25 MCG (1000 UT) CAPS Take 2 capsules (2,000 Units total) by mouth daily.   clonazePAM (KLONOPIN) 0.5 MG tablet TAKE ONE TABLET BY MOUTH EVERYDAY AT BEDTIME   cyanocobalamin (V-R VITAMIN B-12) 500 MCG tablet Take 1 tablet (500 mcg total) by mouth daily.   levocetirizine (XYZAL) 5 MG tablet Take 5 mg by mouth every evening. Replaced Allegra, per patient report   levothyroxine (SYNTHROID) 75 MCG tablet Take 1 tablet (75 mcg total) by mouth daily before breakfast.   omeprazole (PRILOSEC) 40 MG capsule Take 1 capsule (40 mg total) by mouth daily as needed.   oxybutynin (DITROPAN-XL) 5 MG 24 hr tablet Take 1 tablet (5 mg total) by mouth at bedtime.   sertraline (ZOLOFT) 100 MG tablet Take 1.5 tablets (150 mg total) by mouth daily. (Patient taking differently: Take 100 mg by mouth daily. Taking 1 tablet daily (07/22/21))   No facility-administered encounter medications on file as of 09/25/2021.   BP Readings from Last 3 Encounters:  08/20/21 137/78  02/17/21 118/72  08/19/20 124/72    Lab Results  Component Value Date   HGBA1C 6.3 08/13/2021     No OVs, Consults, or hospital visits since last care coordination call / Pharmacist visit. No medication changes indicated  Last adherence delivery date: 09/04/2021      Patient is due for next adherence delivery on: 10/06/2021  Spoke  with patient on 09/29/2021 reviewed medications and coordinated delivery.  This delivery to include: Adherence Packaging  30 Days  Packs: Clonazepam 0.5mg  - 1 tablet bedtime  Levocetirizine 5mg  - 1 tablet bedtime Vitamin D3 2000 IU - 1 tablet breakfast Vitamin B12 500 mcg - 1 tablet breakfast Levothyroxine 75 mcg - 1 tablet daily breakfast Atorvastatin 20 mg - 1 tablet daily at breakfast  Oxybutynin 5 mg XL - 1 tablet daily at bedtime Amlodipine 10 mg - 1 tablet breakfast -  Sertraline 100 mg - take 1.5 tablet daily - Patient taking 1 tab daily only  VIAL medications: None requested    Patient declined the following medications this month: Omeprazole 40 mg - Take 1 capsule by mouth daily as needed - Patient states she has 22 remaining.   Any concerns about your medications? No  How often do you forget or accidentally miss a dose? Rarely  Do you use a pillbox? No  Is patient in packaging Yes  If yes  What is the date on your next pill pack? 09/29/21 Bedtime  Any concerns or issues with your packaging? No  No refill request needed.  Confirmed delivery date of 10/06/21, advised patient that pharmacy will contact them the morning of delivery.  Recent blood pressure readings are as follows: Patient states she does not check it at home.   Recent blood glucose readings are as follows: Patient states she does not check it at home.   Annual wellness visit in  last year? Yes Most Recent BP reading: 137/78 on 08/20/2021  [name], CPP notified  [name], [place] Assistant [phone]  I have reviewed the care management and care coordination activities outlined in this encounter and I am certifying that I agree with the content of this note. No further action required.  [name], PharmD Clinical Pharmacist [facility] at [facility] [phone]

## 2021-10-27 ENCOUNTER — Telehealth: Payer: Self-pay

## 2021-10-27 NOTE — Progress Notes (Addendum)
Chronic Care Management Pharmacy Assistant   Name: Donna Vargas  MRN: [id number] DOB: 12/16/1945  Reason for Encounter: CCM (Medication Adherence and Delivery Coordination)   Recent office visits:  None since last CCM contact  Recent consult visits:  None since last CCM contact  Hospital visits:  None since last CCM contact  Medications: Outpatient Encounter Medications as of 10/27/2021  Medication Sig   amLODipine (NORVASC) 10 MG tablet Take 1 tablet (10 mg total) by mouth daily.   atorvastatin (LIPITOR) 20 MG tablet Take 1 tablet (20 mg total) by mouth daily.   Cholecalciferol (VITAMIN D3) 25 MCG (1000 UT) CAPS Take 2 capsules (2,000 Units total) by mouth daily.   clonazePAM (KLONOPIN) 0.5 MG tablet TAKE ONE TABLET BY MOUTH EVERYDAY AT BEDTIME   cyanocobalamin (V-R VITAMIN B-12) 500 MCG tablet Take 1 tablet (500 mcg total) by mouth daily.   levocetirizine (XYZAL) 5 MG tablet Take 5 mg by mouth every evening. Replaced Allegra, per patient report   levothyroxine (SYNTHROID) 75 MCG tablet Take 1 tablet (75 mcg total) by mouth daily before breakfast.   omeprazole (PRILOSEC) 40 MG capsule Take 1 capsule (40 mg total) by mouth daily as needed.   oxybutynin (DITROPAN-XL) 5 MG 24 hr tablet Take 1 tablet (5 mg total) by mouth at bedtime.   sertraline (ZOLOFT) 100 MG tablet Take 1.5 tablets (150 mg total) by mouth daily. (Patient taking differently: Take 100 mg by mouth daily. Taking 1 tablet daily (07/22/21))   No facility-administered encounter medications on file as of 10/27/2021.   BP Readings from Last 3 Encounters:  08/20/21 137/78  02/17/21 118/72  08/19/20 124/72    Lab Results  Component Value Date   HGBA1C 6.3 08/13/2021    No OVs, Consults, or hospital visits since last care coordination call / Pharmacist visit. No medication changes indicated  Last adherence delivery date: 10/06/2021  Patient is due for next adherence delivery on: 11/04/2021  Spoke  with patient on 10/27/2021 reviewed medications and coordinated delivery.  This delivery to include: Adherence Packaging  30 Days  Packs: Sertraline 100 mg - take 1.5 tablet daily  Clonazepam 0.5mg  - 1 tablet bedtime  Levocetirizine 5mg  - 1 tablet bedtime Vitamin D3 2000 IU - 1 tablet breakfast Vitamin B12 500 mcg - 1 tablet breakfast Levothyroxine 75 mcg - 1 tablet daily breakfast Atorvastatin 20 mg - 1 tablet daily at breakfast  Amlodipine 10 mg - 1 tablet breakfast  Oxybutynin 5 mg XL - 1 tablet daily at bedtime  Patient declined the following medications this month: Omeprazole 40 mg - Take 1 capsule by mouth daily as needed - Patient has 10 remaining.   Any concerns about your medications? No  How often do you forget or accidentally miss a dose? Rarely  Do you use a pillbox? No  Is patient in packaging Yes  If yes  What is the date on your next pill pack? 10/27/2021 Breakfast  Any concerns or issues with your packaging? No  No refill request needed.  Confirmed delivery date of 11/04/2021, advised patient that pharmacy will contact them the morning of delivery.  Recent blood pressure readings are as follows:Patient states she does not check it at home.   Recent blood glucose readings are as follows:Patient states she does not check it at home.   Annual wellness visit in last year? Yes 08/20/2021 Most Recent BP reading:  137/78 on 08/20/2021  [name], CPP notified  [name], [place]  Assistant [phone]  I have reviewed the care management and care coordination activities outlined in this encounter and I am certifying that I agree with the content of this note. No further action required.  [name], PharmD Clinical Pharmacist [place] [facility] at [facility] [phone]

## 2021-11-17 ENCOUNTER — Other Ambulatory Visit: Payer: Self-pay | Admitting: Family Medicine

## 2021-11-17 DIAGNOSIS — Z1231 Encounter for screening mammogram for malignant neoplasm of breast: Secondary | ICD-10-CM

## 2021-11-23 ENCOUNTER — Other Ambulatory Visit: Payer: Self-pay | Admitting: Family Medicine

## 2021-11-24 NOTE — Telephone Encounter (Signed)
Refill request for clonazepam 0.5 mg tablet  LOV - 08/20/21   Next OV - not scheduled Last refill - 09/23/21 #30/1

## 2021-11-25 ENCOUNTER — Telehealth: Payer: Self-pay

## 2021-11-25 NOTE — Progress Notes (Addendum)
Chronic Care Management Pharmacy Assistant   Name: Donna Vargas  MRN: [id number] DOB: 12-18-1945  Reason for Encounter: CCM (Medication Adherence and Delivery Coordination)   Recent office visits:  None since last CCM contact  Recent consult visits:  None since last CCM contact  Hospital visits:  None in previous 6 months  Medications: Outpatient Encounter Medications as of 11/25/2021  Medication Sig   amLODipine (NORVASC) 10 MG tablet Take 1 tablet (10 mg total) by mouth daily.   atorvastatin (LIPITOR) 20 MG tablet Take 1 tablet (20 mg total) by mouth daily.   Cholecalciferol (VITAMIN D3) 25 MCG (1000 UT) CAPS Take 2 capsules (2,000 Units total) by mouth daily.   clonazePAM (KLONOPIN) 0.5 MG tablet TAKE ONE TABLET BY MOUTH EVERYDAY AT BEDTIME   cyanocobalamin (V-R VITAMIN B-12) 500 MCG tablet Take 1 tablet (500 mcg total) by mouth daily.   levocetirizine (XYZAL) 5 MG tablet Take 5 mg by mouth every evening. Replaced Allegra, per patient report   levothyroxine (SYNTHROID) 75 MCG tablet Take 1 tablet (75 mcg total) by mouth daily before breakfast.   omeprazole (PRILOSEC) 40 MG capsule Take 1 capsule (40 mg total) by mouth daily as needed.   oxybutynin (DITROPAN-XL) 5 MG 24 hr tablet Take 1 tablet (5 mg total) by mouth at bedtime.   sertraline (ZOLOFT) 100 MG tablet Take 1.5 tablets (150 mg total) by mouth daily. (Patient taking differently: Take 100 mg by mouth daily. Taking 1 tablet daily (07/22/21))   No facility-administered encounter medications on file as of 11/25/2021.   BP Readings from Last 3 Encounters:  08/20/21 137/78  02/17/21 118/72  08/19/20 124/72    Lab Results  Component Value Date   HGBA1C 6.3 08/13/2021    No OVs, Consults, or hospital visits since last care coordination call / Pharmacist visit. No medication changes indicated  Last adherence delivery date: 11/04/2021  Patient is due for next adherence delivery on: 12/04/2021  Spoke with  patient on 11/25/2021 reviewed medications and coordinated delivery.  This delivery to include: Adherence Packaging  30 Days  Packs: Sertraline 100 mg - take 1.5 tablet daily  Clonazepam 0.5mg  - 1 tablet bedtime  Levocetirizine 5mg  - 1 tablet bedtime Vitamin D3 2000 IU - 1 tablet breakfast Vitamin B12 500 mcg - 1 tablet breakfast Levothyroxine 75 mcg - 1 tablet daily breakfast Atorvastatin 20 mg - 1 tablet daily at breakfast  Amlodipine 10 mg - 1 tablet breakfast  Oxybutynin 5 mg XL - 1 tablet daily at bedtime  Patient declined the following medications this month:  Omeprazole 40 mg - Take 1 capsule by mouth daily as needed - Patient has 8 remaining.   Any concerns about your medications? No  How often do you forget or accidentally miss a dose? Rarely  Do you use a pillbox? No  Is patient in packaging Yes  If yes  What is the date on your next pill pack? 12/27 bedtime  Any concerns or issues with your packaging? No concerns  Refills requested from providers include: Clonazepam 0.5mg  - 1 tablet bedtime   Confirmed delivery date of 01/05, advised patient that pharmacy will contact them the morning of delivery.   Recent blood pressure readings are as follows: Patient states she does not check it at home.    Recent blood glucose readings are as follows: Patient states she does not check it at home.    Annual wellness visit in last year? Yes 08/20/2021 Most Recent BP reading:  137/78 on 08/20/2021   [name], CPP notified   [name], [place] Pharmacy Assistant [phone]  I have reviewed the care management and care coordination activities outlined in this encounter and I am certifying that I agree with the content of this note. No further action required.  [name], PharmD Clinical Pharmacist [place] [facility] at [facility] [phone]

## 2021-11-26 ENCOUNTER — Ambulatory Visit (INDEPENDENT_AMBULATORY_CARE_PROVIDER_SITE_OTHER): Payer: Medicare Other | Admitting: Family Medicine

## 2021-11-26 ENCOUNTER — Encounter: Payer: Self-pay | Admitting: Family Medicine

## 2021-11-26 ENCOUNTER — Other Ambulatory Visit: Payer: Self-pay

## 2021-11-26 VITALS — BP 126/74 | HR 78 | Temp 97.9°F | Ht 62.25 in | Wt 154.5 lb

## 2021-11-26 DIAGNOSIS — R6889 Other general symptoms and signs: Secondary | ICD-10-CM | POA: Diagnosis not present

## 2021-11-26 DIAGNOSIS — K219 Gastro-esophageal reflux disease without esophagitis: Secondary | ICD-10-CM | POA: Diagnosis not present

## 2021-11-26 DIAGNOSIS — J3089 Other allergic rhinitis: Secondary | ICD-10-CM | POA: Diagnosis not present

## 2021-11-26 MED ORDER — OMEPRAZOLE 40 MG PO CPDR: 40.0000 mg | DELAYED_RELEASE_CAPSULE | Freq: Every day | ORAL | 3 refills | Status: DC | PRN

## 2021-11-26 NOTE — Progress Notes (Signed)
Subjective:    Patient ID: Donna Vargas, female    DOB: Jun 28, 1946, 75 y.o.   MRN: [id number]  This visit occurred during the SARS-CoV-2 public health emergency.  Safety protocols were in place, including screening questions prior to the visit, additional usage of staff PPE, and extensive cleaning of exam room while observing appropriate contact time as indicated for disinfecting solutions.   HPI 75 yo pt of Dr Darnell Level presents for congestion and pnd and cough   Wt Readings from Last 3 Encounters:  11/26/21 154 lb 8 oz (70.1 kg)  08/20/21 157 lb 9 oz (71.5 kg)  02/17/21 151 lb 6 oz (68.7 kg)   28.03 kg/m   2 weeks   At night feels like there is a lot of mucous in throat / like post nasal drainage  Dry cough from this as well  Clears throat a lot  Not sore and does not hurt to swallow  No fever  No sob/wheezing  No swelling of neck or throat   No headache  No sinus pain  Ears ring-that is nl for her  No pain or pressure   Occ she has to pay close attn to swallow  Food does not get stuck that she knows of  No nausea or vomiting   Did a covid test-was negative    Some nasal congestion  Not runny Cannot get mucous out   Has seen an allergies- did not find any particular allergies  Unsure what past symptoms were  Took shots for a while   Xyzal -takes that daily     Review of chart notes in 2020 pt felt some throat symptoms due to dry mouth from oxybutynin  She still takes it  Does not think her mouth is dry    H/o GERD takes omeprazole 40 mg prn  Does not need it every day Has taken her medicine a few times this week   Has h/o thyroid nodule in the past Not bothersome   Patient Active Problem List   Diagnosis Date Noted   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   Vitamin D deficiency 08/19/2020   Low serum vitamin B12 06/12/2020   Chronic fatigue 06/12/2020   Tinnitus 11/21/2019   Osteopenia 10/19/2019   Carotid stenosis 08/09/2019   Lumbar herniated  disc 07/19/2019   GERD (gastroesophageal reflux disease) 01/11/2019   Sensation of swollen throat 01/11/2019   Lumbar scoliosis 12/10/2018   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   Palpitations 09/06/2018   Left lateral epicondylitis 12/06/2017   Hypothyroidism 08/18/2017   Hearing loss due to cerumen impaction 07/09/2017   Abnormal drug screen 05/30/2016   HLD (hyperlipidemia) 10/17/2015   Medicare annual wellness visit, subsequent 09/11/2014   Advanced care planning/counseling discussion 09/11/2014   Right thyroid nodule 09/11/2014   Pain in lateral portion of right knee 08/31/2014   Surgical menopause 08/21/2014   Perennial allergic rhinitis    MDD (major depressive disorder), recurrent episode, moderate ([place])    Osteoarthritis    Hypertension    Prediabetes 06/30/2014   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   History of asthma 1990s   with bronchitis   History of  diverticulitis    ?IBS   Hypertension    Hypothyroidism 2015   ?by prior PCP   Osteopenia 10/19/2019   DEXA 10/2019 T -1.2 hip (osteopenia)   Perennial allergic rhinitis    allergy shots (sharma)   Prediabetes 06/2014   Right thyroid nodule 09/11/2014   S/p biopsy showing benign colloid nodule (08/2014)    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 CA125 level elevated - but no cancer   Social History   Tobacco  Use   Smoking status: Never   Smokeless tobacco: Never  Vaping Use   Vaping Use: Never used  Substance Use Topics   Alcohol use: No    Alcohol/week: 0.0 standard drinks   Drug use: No   Family History  Problem Relation Age of Onset   Cancer Mother        ovarian x2   Ovarian cancer Mother    CAD Father        unclear   Breast cancer Maternal Grandmother    CAD Maternal Grandfather    Breast cancer Cousin        maternal   Breast cancer Cousin        paternal   Hypertension Neg Hx    Diabetes Neg Hx    Allergies  Allergen Reactions   Hctz [Hydrochlorothiazide]     Blood came to the surface of her legs   Sulfa Antibiotics Rash   Current Outpatient Medications on File Prior to Visit  Medication Sig Dispense Refill   amLODipine (NORVASC) 10 MG tablet Take 1 tablet (10 mg total) by mouth daily. 90 tablet 3   atorvastatin (LIPITOR) 20 MG tablet Take 1 tablet (20 mg total) by mouth daily. 90 tablet 3   Cholecalciferol (VITAMIN D3) 25 MCG (1000 UT) CAPS Take 2 capsules (2,000 Units total) by mouth daily.     clonazePAM (KLONOPIN) 0.5 MG tablet TAKE ONE TABLET BY MOUTH EVERYDAY AT BEDTIME 30 tablet 1   cyanocobalamin (V-R VITAMIN B-12) 500 MCG tablet Take 1 tablet (500 mcg total) by mouth daily.     levocetirizine (XYZAL) 5 MG tablet Take 5 mg by mouth every evening. Replaced Allegra, per patient report     levothyroxine (SYNTHROID) 75 MCG tablet Take 1 tablet (75 mcg total) by mouth daily before breakfast. 90 tablet 3   oxybutynin (DITROPAN-XL) 5 MG 24 hr tablet Take 1 tablet (5 mg total) by mouth at bedtime. 90 tablet 3   sertraline (ZOLOFT) 100 MG tablet Take 1.5 tablets (150 mg total) by mouth daily. (Patient taking differently: Take 100 mg by mouth daily. Taking 1 tablet daily (07/22/21)) 135 tablet 3   No current facility-administered medications on file prior to visit.     Review of Systems  Constitutional:  Negative for activity change, appetite change, fatigue, fever  and unexpected weight change.  HENT:  Positive for congestion, postnasal drip and trouble swallowing. Negative for ear pain, rhinorrhea, sinus pressure, sore throat and voice change.   Eyes:  Negative for pain, redness and visual disturbance.  Respiratory:  Positive for cough. Negative for apnea, choking, chest  tightness, shortness of breath, wheezing and stridor.   Cardiovascular:  Negative for chest pain and palpitations.  Gastrointestinal:  Negative for abdominal pain, blood in stool, constipation and diarrhea.  Endocrine: Negative for polydipsia and polyuria.  Genitourinary:  Negative for dysuria, frequency and urgency.  Musculoskeletal:  Negative for arthralgias, back pain and myalgias.  Skin:  Negative for pallor and rash.  Allergic/Immunologic: Negative for environmental allergies.  Neurological:  Negative for dizziness, syncope and headaches.  Hematological:  Negative for adenopathy. Does not bruise/bleed easily.  Psychiatric/Behavioral:  Negative for decreased concentration and dysphoric mood. The patient is not nervous/anxious.       Objective:   Physical Exam Constitutional:      General: She is not in acute distress.    Appearance: Normal appearance. She is well-developed and normal weight. She is not ill-appearing.  HENT:     Head: Normocephalic and atraumatic.     Comments: No sinus tenderness    Right Ear: Tympanic membrane, ear canal and external ear normal. There is impacted cerumen.     Left Ear: Tympanic membrane, ear canal and external ear normal. There is impacted cerumen.     Nose: Congestion and rhinorrhea present.     Comments: Some clear pnd    Mouth/Throat:     Mouth: Mucous membranes are moist.     Pharynx: Oropharynx is clear. No oropharyngeal exudate or posterior oropharyngeal erythema.     Comments: Nl appearing mouth and pharynx  No masses   Eyes:     Conjunctiva/sclera: Conjunctivae normal.     Pupils: Pupils are equal, round, and reactive to light.   Neck:     Thyroid: No thyromegaly.     Vascular: No carotid bruit or JVD.  Cardiovascular:     Rate and Rhythm: Normal rate and regular rhythm.     Heart sounds: Normal heart sounds.    No gallop.  Pulmonary:     Effort: Pulmonary effort is normal. No respiratory distress.     Breath sounds: Normal breath sounds. No wheezing or rales.  Abdominal:     General: Bowel sounds are normal. There is no distension or abdominal bruit.     Palpations: Abdomen is soft. There is no mass.     Tenderness: There is no abdominal tenderness. There is no guarding.  Musculoskeletal:     Cervical back: Normal range of motion and neck supple. No rigidity or tenderness.     Right lower leg: No edema.     Left lower leg: No edema.  Lymphadenopathy:     Cervical: No cervical adenopathy.  Skin:    General: Skin is warm and dry.     Coloration: Skin is not pale.     Findings: No rash.  Neurological:     Mental Status: She is alert.     Cranial Nerves: No cranial nerve deficit.     Motor: No weakness.     Coordination: Coordination normal.     Deep Tendon Reflexes: Reflexes are normal and symmetric. Reflexes normal.  Psychiatric:        Mood and Affect: Mood normal.          Assessment & Plan:   Problem List Items Addressed This Visit       Respiratory   Perennial allergic rhinitis    Continues xyzal  pnd may worsen globus sensation and cough  Pt plans to try daily flonase to see if helpful (has used it before)  Digestive   GERD (gastroesophageal reflux disease)    May be fueling globus sensation and cough  Disc diet changes to make  inst to take her omeprazole 40 mg every day instead of prn        Relevant Medications   omeprazole (PRILOSEC) 40 MG capsule     Other   Sensation of swollen throat - Primary    This seems to be recurring ?  Record reviewed today Cough/throat clearing and a globus sensation  Reassuring exam  Disc GERD and rhinitis as possible causes   Also h/o thyroid nodule in the past (no thyroid tenderness on exam) Plan to take omeprazole 40 mg daily and watch diet  Also add flonase ns daily  If not improved in 1-2 weeks inst to call and let us know  May consider further eval such as ENT ref or imaging

## 2021-11-26 NOTE — Patient Instructions (Addendum)
Acid reflux (GERD) may be causing your throat problem  Please take the omeprazole 40 mg daily (every day instead of as needed)  Also avoid foods that give you heartburn. Don't have too much caffeine or carbonation)   I expect improvement in 1-2 weeks with the daily medication  If not improved please call and let us know that this did not help so we can decide what to do next (would consider ENT doctor)   On the off chance that allergies/post nasal drip add to your problem you can also try to add flonase once daily as directed (the store brand of fluticasone is fine)    If symptoms suddenly worsen or change please call

## 2021-11-26 NOTE — Telephone Encounter (Signed)
ERx 

## 2021-11-26 NOTE — Telephone Encounter (Signed)
I spoke with Donna Vargas; Donna Vargas said for few weeks at night has a lot of mucus in throat; Donna Vargas said it is like a drainage; Donna Vargas denies fever, body aches, chills, H/A,SOB, S/T, runnynose, head congestion, diarrhea, vomiting and no loss of taste or smell. Donna Vargas said she does have a dry cough for couple of weeks due to drainage at back of throat. No difficulty breathing and Donna Vargas said she guesses she does not have any swelling in neck or throat. Donna Vargas said sometimes at night meal Donna Vargas  has difficulty in chewing enough to get food small enough to swallow. No weakness in limbs, no memory issues, no problem focusing, no slurred speech.  Donna Vargas said she feels OK right now. Donna Vargas is going to test covid home test this morning and if + Donna Vargas will cb to change appt to [facility] and if neg Donna Vargas will plan on coming to [facility] 11/26/21 at 3 PM to see Dr Glori Bickers. UC & ED precautions given and Donna Vargas voiced understanding. Sending note to Dr Glori Bickers and Rexene Agent CMA and will teams [place] as well.

## 2021-11-26 NOTE — Assessment & Plan Note (Addendum)
This seems to be recurring ?  Record reviewed today Cough/throat clearing and a globus sensation  Reassuring exam  Disc GERD and rhinitis as possible causes  Also h/o thyroid nodule in the past (no thyroid tenderness on exam) Plan to take omeprazole 40 mg daily and watch diet  Also add flonase ns daily  If not improved in 1-2 weeks inst to call and let us know  May consider further eval such as ENT ref or imaging

## 2021-11-26 NOTE — Assessment & Plan Note (Signed)
Continues xyzal  pnd may worsen globus sensation and cough  Pt plans to try daily flonase to see if helpful (has used it before)

## 2021-11-26 NOTE — Assessment & Plan Note (Signed)
May be fueling globus sensation and cough  Disc diet changes to make  inst to take her omeprazole 40 mg every day instead of prn

## 2021-12-05 ENCOUNTER — Telehealth: Payer: Self-pay

## 2021-12-05 NOTE — Telephone Encounter (Signed)
Received fax from [facility] that Sertraline is needing PA. When going to submit this request received note from [employer] that this medication does not need PA and patient access to the RX.

## 2021-12-23 ENCOUNTER — Telehealth: Payer: Self-pay

## 2021-12-23 NOTE — Progress Notes (Addendum)
Chronic Care Management Pharmacy Assistant   Name: Donna Vargas  MRN: [id number] DOB: 1946/03/29  Reason for Encounter: CCM (Medication Adherence and Delivery Coordination)   Recent office visits:  None since last CCM contact  Recent consult visits:  11/26/2021 - Loura Pardon, MD - Family Medicine - Patient presented for sensatin of swollen throat. Change: Omeprazole 40 mg daily instead of PRN. Start: Flonase daily for allergic rhinitis.   Hospital visits:  None in previous 6 months  Medications: Outpatient Encounter Medications as of 12/23/2021  Medication Sig   amLODipine (NORVASC) 10 MG tablet Take 1 tablet (10 mg total) by mouth daily.   atorvastatin (LIPITOR) 20 MG tablet Take 1 tablet (20 mg total) by mouth daily.   Cholecalciferol (VITAMIN D3) 25 MCG (1000 UT) CAPS Take 2 capsules (2,000 Units total) by mouth daily.   clonazePAM (KLONOPIN) 0.5 MG tablet TAKE ONE TABLET BY MOUTH EVERYDAY AT BEDTIME   cyanocobalamin (V-R VITAMIN B-12) 500 MCG tablet Take 1 tablet (500 mcg total) by mouth daily.   levocetirizine (XYZAL) 5 MG tablet Take 5 mg by mouth every evening. Replaced Allegra, per patient report   levothyroxine (SYNTHROID) 75 MCG tablet Take 1 tablet (75 mcg total) by mouth daily before breakfast.   omeprazole (PRILOSEC) 40 MG capsule Take 1 capsule (40 mg total) by mouth daily as needed.   oxybutynin (DITROPAN-XL) 5 MG 24 hr tablet Take 1 tablet (5 mg total) by mouth at bedtime.   sertraline (ZOLOFT) 100 MG tablet Take 1.5 tablets (150 mg total) by mouth daily. (Patient taking differently: Take 100 mg by mouth daily. Taking 1 tablet daily (07/22/21))   No facility-administered encounter medications on file as of 12/23/2021.   BP Readings from Last 3 Encounters:  11/26/21 126/74  08/20/21 137/78  02/17/21 118/72    Lab Results  Component Value Date   HGBA1C 6.3 08/13/2021    Recent OV, Consult or Hospital visit:  Recent medication changes indicated:   Change: Omeprazole 40 mg daily instead of PRN.  Start: Flonase daily for allergic rhinitis - OTC   Last adherence delivery date: 12/04/2021      Patient is due for next adherence delivery on: 01/02/2022  Spoke with patient on 12/23/2021 reviewed medications and coordinated delivery.  This delivery to include: Adherence Packaging  30 Days  Packs: Sertraline 100 mg - take 1.5 tablet at breakfast Clonazepam 0.5mg  - 1 tablet bedtime  Levocetirizine 5mg  - 1 tablet bedtime Vitamin D3 2000 IU - 1 tablet breakfast Vitamin B12 500 mcg - 1 tablet breakfast Amlodipine 10 mg - 1 tablet breakfast  Levothyroxine 75 mcg - 1 tablet daily breakfast Atorvastatin 20 mg - 1 tablet daily at breakfast  Oxybutynin 5 mg XL - 1 tablet daily at bedtime   Omeprazole 40 mg - Take 1 capsule at breakfast  Patient declined the following medications this month: None declined  Any concerns about your medications? No  How often do you forget or accidentally miss a dose? Never  Do you use a pillbox? No  Is patient in packaging Yes  If yes  What is the date on your next pill pack? 12/23/2021 - bedtime  Any concerns or issues with your packaging? No  No refill request needed.  Confirmed delivery date of 01/02/2022, advised patient that pharmacy will contact them the morning of delivery.   Recent blood pressure readings are as follows: Patient has been taking her blood pressure at home. She does not remember what her  last reading was, but stated it was lower than normal.   Annual wellness visit in last year? Yes 08/20/2021 Most Recent BP reading: 126/74 on 11/26/2021  [name], CPP notified  [name], [place] Assistant [phone]  I have reviewed the care management and care coordination activities outlined in this encounter and I am certifying that I agree with the content of this note. No further action required.  [name], PharmD Clinical Pharmacist [facility] [phone]

## 2021-12-30 DIAGNOSIS — D492 Neoplasm of unspecified behavior of bone, soft tissue, and skin: Secondary | ICD-10-CM | POA: Diagnosis not present

## 2021-12-30 DIAGNOSIS — C44629 Squamous cell carcinoma of skin of left upper limb, including shoulder: Secondary | ICD-10-CM | POA: Diagnosis not present

## 2022-01-07 ENCOUNTER — Other Ambulatory Visit: Payer: Self-pay

## 2022-01-07 ENCOUNTER — Ambulatory Visit
Admission: RE | Admit: 2022-01-07 | Discharge: 2022-01-07 | Disposition: A | Discharge status: Home / Self Care | Payer: Medicare Other | Source: Ambulatory Visit | Attending: Family Medicine | Admitting: Family Medicine

## 2022-01-07 DIAGNOSIS — Z1231 Encounter for screening mammogram for malignant neoplasm of breast: Secondary | ICD-10-CM | POA: Insufficient documentation

## 2022-01-08 ENCOUNTER — Ambulatory Visit (INDEPENDENT_AMBULATORY_CARE_PROVIDER_SITE_OTHER): Payer: Medicare Other | Admitting: Internal Medicine

## 2022-01-08 ENCOUNTER — Encounter: Payer: Self-pay | Admitting: Internal Medicine

## 2022-01-08 DIAGNOSIS — R0981 Nasal congestion: Secondary | ICD-10-CM | POA: Diagnosis not present

## 2022-01-08 DIAGNOSIS — M79605 Pain in left leg: Secondary | ICD-10-CM

## 2022-01-08 DIAGNOSIS — M79604 Pain in right leg: Secondary | ICD-10-CM

## 2022-01-08 DIAGNOSIS — M79606 Pain in leg, unspecified: Secondary | ICD-10-CM | POA: Insufficient documentation

## 2022-01-08 DIAGNOSIS — M79651 Pain in right thigh: Secondary | ICD-10-CM | POA: Insufficient documentation

## 2022-01-08 NOTE — Patient Instructions (Signed)
Try a nasal saline spray and get a large humidifier to offset the dryness caused by the wood heat

## 2022-01-08 NOTE — Assessment & Plan Note (Signed)
Seems to be related to dry heat from wood stove Discussed nasal saline spray Needs humidifier

## 2022-01-08 NOTE — Progress Notes (Signed)
Subjective:    Patient ID: Donna Vargas, female    DOB: [birth year], 76 y.o.   MRN: [id number]  HPI Here due to leg pain and congestion  Has some back issues--but yesterday morning and could barely get up (legs bothering her) Chest and total body aches Did take arthritis pain tabs Some better now Wondering if she needs back shots   Some cough Lots of drainage from nose---every day Allergist tested her--didn't find anything No pets---just outdoor animals Carpeting Wood heat----water on stoves  Current Outpatient Medications on File Prior to Visit  Medication Sig Dispense Refill   amLODipine (NORVASC) 10 MG tablet Take 1 tablet (10 mg total) by mouth daily. 90 tablet 3   atorvastatin (LIPITOR) 20 MG tablet Take 1 tablet (20 mg total) by mouth daily. 90 tablet 3   Cholecalciferol (VITAMIN D3) 25 MCG (1000 UT) CAPS Take 2 capsules (2,000 Units total) by mouth daily.     clonazePAM (KLONOPIN) 0.5 MG tablet TAKE ONE TABLET BY MOUTH EVERYDAY AT BEDTIME 30 tablet 1   cyanocobalamin (V-R VITAMIN B-12) 500 MCG tablet Take 1 tablet (500 mcg total) by mouth daily.     levocetirizine (XYZAL) 5 MG tablet Take 5 mg by mouth every evening. Replaced Allegra, per patient report     levothyroxine (SYNTHROID) 75 MCG tablet Take 1 tablet (75 mcg total) by mouth daily before breakfast. 90 tablet 3   omeprazole (PRILOSEC) 40 MG capsule Take 1 capsule (40 mg total) by mouth daily as needed. 90 capsule 3   oxybutynin (DITROPAN-XL) 5 MG 24 hr tablet Take 1 tablet (5 mg total) by mouth at bedtime. 90 tablet 3   sertraline (ZOLOFT) 100 MG tablet Take 1.5 tablets (150 mg total) by mouth daily. (Patient taking differently: Take 100 mg by mouth daily. Taking 1 tablet daily (07/22/21)) 135 tablet 3   No current facility-administered medications on file prior to visit.    Allergies  Allergen Reactions   Hctz [Hydrochlorothiazide]     Blood came to the surface of her legs   Sulfa Antibiotics Rash     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   History of asthma 1990s   with bronchitis   History of diverticulitis    ?IBS   Hypertension    Hypothyroidism 2015   ?by prior PCP   Osteopenia 10/19/2019   DEXA 10/2019 T -1.2 hip (osteopenia)   Perennial allergic rhinitis    allergy shots (sharma)   Prediabetes 06/2014   Right thyroid nodule 09/11/2014   S/p biopsy showing benign colloid nodule (08/2014)    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 CA125 level elevated - but no cancer    Family History  Problem Relation Age of Onset   Cancer Mother        ovarian x2   Ovarian cancer Mother  CAD Father        unclear   Breast cancer Maternal Grandmother    CAD Maternal Grandfather    Breast cancer Cousin        maternal   Breast cancer Cousin        paternal   Hypertension Neg Hx    Diabetes Neg Hx     Social History   Socioeconomic History   Marital status: Married    Spouse name: Not on file   Number of children: Not on file   Years of education: Not on file   Highest education level: Not on file  Occupational History   Not on file  Tobacco Use   Smoking status: Never   Smokeless tobacco: Never  Vaping Use   Vaping Use: Never used  Substance and Sexual Activity   Alcohol use: No    Alcohol/week: 0.0 standard drinks   Drug use: No   Sexual activity: Yes  Other Topics Concern   Not on file  Social History Narrative   "[place]"   Lives  with husband, horse, donkeys, rabbits and goats   Family nearby   Occupation: retired, 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  HENT:     Nose:     Comments: Marked dryness Mild inflammation but not overly swollen Pulmonary:     Effort: Pulmonary effort is normal.     Breath sounds: Normal breath sounds. No wheezing or rales.  Musculoskeletal:     Comments: No spine tenderness SLR negative Normal ROM in hips  Neurological:     Mental Status: She is alert.     Comments: Normal gait No leg weakness           Assessment & Plan:

## 2022-01-08 NOTE — Assessment & Plan Note (Signed)
Really bad yesterday--but better Nothing to suggest HNP or other serious back pathology--but she has had prior evaluation Could have had myalgia from viral infection--but already mostly better

## 2022-01-15 ENCOUNTER — Telehealth: Payer: Self-pay

## 2022-01-15 NOTE — Progress Notes (Signed)
° ° °  Chronic Care Management Pharmacy Assistant   Name: Donna Vargas  MRN: [id number] DOB: December 15, 1945  Reason for Encounter: CCM (Appointment Reminder)   Medications: Outpatient Encounter Medications as of 01/15/2022  Medication Sig   amLODipine (NORVASC) 10 MG tablet Take 1 tablet (10 mg total) by mouth daily.   atorvastatin (LIPITOR) 20 MG tablet Take 1 tablet (20 mg total) by mouth daily.   Cholecalciferol (VITAMIN D3) 25 MCG (1000 UT) CAPS Take 2 capsules (2,000 Units total) by mouth daily.   clonazePAM (KLONOPIN) 0.5 MG tablet TAKE ONE TABLET BY MOUTH EVERYDAY AT BEDTIME   cyanocobalamin (V-R VITAMIN B-12) 500 MCG tablet Take 1 tablet (500 mcg total) by mouth daily.   levocetirizine (XYZAL) 5 MG tablet Take 5 mg by mouth every evening. Replaced Allegra, per patient report   levothyroxine (SYNTHROID) 75 MCG tablet Take 1 tablet (75 mcg total) by mouth daily before breakfast.   omeprazole (PRILOSEC) 40 MG capsule Take 1 capsule (40 mg total) by mouth daily as needed.   oxybutynin (DITROPAN-XL) 5 MG 24 hr tablet Take 1 tablet (5 mg total) by mouth at bedtime.   sertraline (ZOLOFT) 100 MG tablet Take 1.5 tablets (150 mg total) by mouth daily. (Patient taking differently: Take 100 mg by mouth daily. Taking 1 tablet daily (07/22/21))   No facility-administered encounter medications on file as of 01/15/2022.   Shanterria Franta was contacted to remind of upcoming telephone visit with [name] on 01/20/2022 at 9:00 am. Patient was reminded to have all medications, supplements and any blood glucose and blood pressure readings available for review at appointment. If unable to reach, a voicemail was left for patient.   Patient will have a blood pressure log for the appointment.  Are you having any problems with your medications? No   Do you have any concerns you like to discuss with the pharmacist? No  Star Rating Drugs: Medication:  Last Fill: Day Supply Atorvastatin 20  mg  [name], CPP notified  [name], [place]  Time Spent: 10 Minutes

## 2022-01-20 ENCOUNTER — Ambulatory Visit (INDEPENDENT_AMBULATORY_CARE_PROVIDER_SITE_OTHER): Payer: Medicare Other

## 2022-01-20 ENCOUNTER — Other Ambulatory Visit: Payer: Self-pay

## 2022-01-20 DIAGNOSIS — E785 Hyperlipidemia, unspecified: Secondary | ICD-10-CM

## 2022-01-20 DIAGNOSIS — E039 Hypothyroidism, unspecified: Secondary | ICD-10-CM

## 2022-01-20 DIAGNOSIS — I10 Essential (primary) hypertension: Secondary | ICD-10-CM

## 2022-01-20 DIAGNOSIS — K219 Gastro-esophageal reflux disease without esophagitis: Secondary | ICD-10-CM

## 2022-01-20 DIAGNOSIS — F331 Major depressive disorder, recurrent, moderate: Secondary | ICD-10-CM

## 2022-01-20 NOTE — Patient Instructions (Signed)
Dear Maryruth Bun,  Below is a summary of the goals we discussed during our follow up appointment on January 20, 2022. Please contact me anytime with questions or concerns.   Visit Information Patient Care Plan: CCM Pharmacy Care Plan     Problem Identified: CHL AMB "PATIENT-SPECIFIC PROBLEM"      Long-Range Goal: Disease Management   Start Date: 06/09/2021  This Visit's Progress: On track  Recent Progress: On track  Priority: High  Note:   Current Barriers:  None identified   Pharmacist Clinical Goal(s):  Patient will contact provider office for questions/concerns as evidenced notation of same in electronic health record through collaboration with PharmD and provider.   Interventions: 1:1 collaboration with [name], MD regarding development and update of comprehensive plan of care as evidenced by provider attestation and co-signature Inter-disciplinary care team collaboration (see longitudinal plan of care) Comprehensive medication review performed; medication list updated in electronic medical record  Hypertension (BP goal <140/90) -Controlled, per clinic readings  -Current treatment: Amlodipine 10 mg - 1 tablet daily -Medications previously tried: HCTZ - unable to tolerate  -Denies dizziness, imbalance, or falls.  -Recommended to continue current medication  Hyperlipidemia: (LDL goal < 100) -Not ideally controlled, but significant reduction with daily atorvastatin. She increased atorvastatin from 3 days per week to daily 07/08/21. Lipid panel updated 08/13/21. LDL improved from 179 to 103. History of myalgia.  -Current treatment: Atorvastatin 20 mg - 1 tablet every day -Medications previously tried: none reported  -Current exercise habits: Limits time on her feet due to hip pain or leg swelling/varicose veins. No formal exercise. She cares for her mom often and stays on her feet. -Recommended to continue current medication  MDD with anxiety (Goal:  Improve symptoms as evidenced by PHQ-9, GAD-7) -Controlled - GAD-7 today 2. Denies depressed mood. Sleeping very well with 6-7 hours from 12AM - 7AM. Denies nighttime awakenings.  -Current treatment  Sertraline 150 mg - take one tablet daily Clonazepam - takes 1 at bedtime every night  -Medications previously tried: none reported  -PHQ-2: 0 (today) -GAD-7: 2 (today) -Recommended to continue current medication  Mixed Urge and Stress Incontinence (Goal: Improve symptoms) -Controlled - pt restarted oxybutynin daily after last visit (around 07/07/21). She has not noticed any improvement in urinary symptoms. Symptoms primarily stress related - sneezing, squatting, etc. Due to no notable improvement we discussed 1 month trial off medication.  --Current treatment  Oxybutynin 5 mg XL - 1 tablet daily at bedtime (started daily 07/07/21) -Medications previously tried: none  -Recommended to hold oxybutynin 1 month starting February 04, 2022. Patient to let us know if symptoms worsen.   Hypothyroidism (Goal: Symptom control) -Controlled - per labs TSH WNL 07/2021, She is on generic levothyroxine. Takes it with other medications in morning. Does not separate. -Current treatment  Levothyroxine 75 mcg - 1 tablet daily  -Medications previously tried: none  -Recommended to continue current medication   Acid Reflux (Goal: Symptom Control) -Controlled - per pt report. Now taking daily due to globus sensation -Current treatment  Omeprazole 40 mg - 1 capsule daily -Medications previously tried: none  -Recommended to continue current medication  OTCs (in packs): Levocetirizine 5 mg daily Vitamin D3 2000 IU daily Vitamin B12 500 mcg daily   -All OTCs remain appropriate.  Patient Goals/Self-Care Activities Patient will:  - continue to take medications daily as prescribed   Follow Up Plan: Telephone follow up appointment with care management team member scheduled for:  -CCM PharmD follow  up 12 months -CMA  calls monthly for adherence and delivery coordination - check in OAB symptoms off oxybutynin next month med sync       Patient verbalizes understanding of instructions and care plan provided today and agrees to view in [place]. Active MyChart status confirmed with patient.    [name], PharmD Clinical Pharmacist Practitioner [facility] [phone]

## 2022-01-20 NOTE — Progress Notes (Signed)
Chronic Care Management Pharmacy Note  01/20/2022 Name:  Donna Vargas MRN:  [id number] DOB:  04/12/1946  Summary: CCM 6 month follow up. All meds including OTCs are in pill packs from [facility]. She is doing very well with adherence. No barriers or cost concerns. Completed GAD-7 (score - 2 mild anxiety). Anxiety stable on sertraline 150 mg daily. PHQ-2 of zero, she denies depressed mood. HTN, controlled on amlodipine. LDL 103, much improved on atorvastatin 20 mg daily. Mixed urinary incontinence, with no notable improvement on oxybutynin. Will hold from next months pill packs and reassess symptoms.   Recommendations:  Reminded to schedule 6 month PCP visit  Plan: -CCM PharmD follow up 12 months -CMA calls monthly for adherence and delivery coordination - check in urinary symptoms off oxybutynin during April call  Subjective: Donna Vargas is an 76 y.o. year old female who is a primary patient of [name], MD.  The CCM team was consulted for assistance with disease management and care coordination needs.    Engaged with patient by telephone for follow up visit in response to provider referral for pharmacy case management and/or care coordination services.   Consent to Services:  The patient was given information about Chronic Care Management services, agreed to services, and gave verbal consent prior to initiation of services.  Please see initial visit note for detailed documentation.   Patient Care Team: [name], MD as PCP - General (Family Medicine) [name], [facility] as Pharmacist (Pharmacist)  Recent office visits:  01/08/22 - Viviana Simpler, MD - Pt presented for acute leg pain and congestion. Try a nasal saline spray and get a large humidifier to offset the dryness caused by the wood heat. 11/26/21 Loura Pardon, MD - Pt presented for sensation of swollen throat. Take the omeprazole 40 mg daily (every day instead of as needed). Add Flonase once  daily. Call if symptoms worsen or change. 08/20/21 - [name], MD, PCP - Pt presented for AWV. Increase water by 1-2 glasses/day. Follow up 6 months.  Recent consult visits:  01/07/22 [facility] visits:  None in previous 6 months  Objective:  Lab Results  Component Value Date   CREATININE 0.76 08/13/2021   BUN 17 08/13/2021   GFR 78.03 08/13/2021   NA 142 08/13/2021   K 3.9 08/13/2021   CALCIUM 9.1 08/13/2021   CO2 28 08/13/2021   GLUCOSE 94 08/13/2021    Lab Results  Component Value Date/Time   HGBA1C 6.3 08/13/2021 09:35 AM   HGBA1C 6.2 02/10/2021 09:26 AM   GFR 78.03 08/13/2021 09:35 AM   GFR 74.83 08/13/2020 08:13 AM   MICROALBUR 0.7 08/13/2021 09:35 AM   MICROALBUR <0.7 08/13/2020 08:13 AM    Lab Results  Component Value Date   CHOL 175 08/13/2021   HDL 51.00 08/13/2021   LDLCALC 103 (H) 08/13/2021   TRIG 103.0 08/13/2021   CHOLHDL 3 08/13/2021    Hepatic Function Latest Ref Rng & Units 08/13/2021 08/13/2020 08/02/2019  Total Protein 6.0 - 8.3 g/dL 7.2 7.2 7.2  Albumin 3.5 - 5.2 g/dL 4.0 4.1 4.0  AST 0 - 37 U/L $Remo'14 15 15  'zERWp$ ALT 0 - 35 U/L $Remo'12 10 9  'LVkGB$ Alk Phosphatase 39 - 117 U/L 78 67 78  Total Bilirubin 0.2 - 1.2 mg/dL 0.3 0.4 0.3  Bilirubin, Direct 0.0 - 0.3 mg/dL - - -    Lab Results  Component Value Date/Time   TSH 2.32 08/13/2021 09:35 AM  TSH 7.30 (H) 02/10/2021 09:26 AM   FREET4 0.66 08/13/2021 09:35 AM   FREET4 0.59 (L) 02/10/2021 09:26 AM    CBC Latest Ref Rng & Units 06/12/2020 01/30/2016 07/18/2014  WBC 4.0 - 10.5 K/uL 6.3 6.2 5.3  Hemoglobin 12.0 - 15.0 g/dL 13.2 13.2 12.8  Hematocrit 36.0 - 46.0 % 39.6 39.5 -  Platelets 150.0 - 400.0 K/uL 231.0 219.0 -    Lab Results  Component Value Date/Time   VD25OH 30.62 08/13/2021 09:35 AM   VD25OH 24.94 (L) 02/10/2021 09:26 AM    Clinical ASCVD: No  The 10-year ASCVD risk score (Arnett DK, et al., 2019) is: 15%   Values used to calculate the score:     Age: 14 years     Sex:  Female     Is Non-Hispanic African American: No     Diabetic: No     Tobacco smoker: No     Systolic Blood Pressure: 354 mmHg     Is BP treated: Yes     HDL Cholesterol: 51 mg/dL     Total Cholesterol: 175 mg/dL    Depression screen [facility] 2/9 01/20/2022 08/20/2021 08/13/2020  Decreased Interest 0 0 0  Down, Depressed, Hopeless 0 0 0  PHQ - 2 Score 0 0 0  Altered sleeping - 0 0  Tired, decreased energy - 1 0  Change in appetite - 0 0  Feeling bad or failure about yourself  - 1 0  Trouble concentrating - 1 0  Moving slowly or fidgety/restless - 2 0  Suicidal thoughts - 0 0  PHQ-9 Score - 5 0  Difficult doing work/chores - - Not difficult at all    Social History   Tobacco Use  Smoking Status Never  Smokeless Tobacco Never   BP Readings from Last 3 Encounters:  01/08/22 120/76  11/26/21 126/74  08/20/21 137/78   Pulse Readings from Last 3 Encounters:  01/08/22 89  11/26/21 78  08/20/21 74   Wt Readings from Last 3 Encounters:  01/08/22 154 lb (69.9 kg)  11/26/21 154 lb 8 oz (70.1 kg)  08/20/21 157 lb 9 oz (71.5 kg)   BMI Readings from Last 3 Encounters:  01/08/22 27.94 kg/m  11/26/21 28.03 kg/m  08/20/21 28.59 kg/m    Assessment/Interventions: Review of patient past medical history, allergies, medications, health status, including review of consultants reports, laboratory and other test data, was performed as part of comprehensive evaluation and provision of chronic care management services.   SDOH:  (Social Determinants of Health) assessments and interventions performed: Yes SDOH Interventions    Flowsheet Row Most Recent Value  SDOH Interventions   Financial Strain Interventions Intervention Not Indicated  Transportation Interventions Intervention Not Indicated       SDOH Screenings   Alcohol Screen: Not on file  Depression (PHQ2-9): Low Risk    PHQ-2 Score: 0  Financial Resource Strain: Low Risk    Difficulty of Paying Living Expenses: Not hard at  all  Food Insecurity: Not on file  Housing: Not on file  Physical Activity: Not on file  Social Connections: Not on file  Stress: Not on file  Tobacco Use: Low Risk    Smoking Tobacco Use: Never   Smokeless Tobacco Use: Never   Passive Exposure: Not on file  Transportation Needs: No Transportation Needs   Lack of Transportation (Medical): No   Lack of Transportation (Non-Medical): No    CCM Care Plan  Allergies  Allergen Reactions   Hctz [Hydrochlorothiazide]  Blood came to the surface of her legs   Sulfa Antibiotics Rash    Medications Reviewed Today     Reviewed by [name], [facility] (Pharmacist) on 01/20/22 at [place] List Status: <None>   Medication Order Taking? Sig Documenting Provider Last Dose Status Informant  amLODipine (NORVASC) 10 MG tablet [id number] Yes Take 1 tablet (10 mg total) by mouth daily. [name], MD Taking Active   atorvastatin (LIPITOR) 20 MG tablet [id number] Yes Take 1 tablet (20 mg total) by mouth daily. [name], MD Taking Active   Cholecalciferol (VITAMIN D3) 25 MCG (1000 UT) CAPS [id number] Yes Take 2 capsules (2,000 Units total) by mouth daily. [name], MD Taking Active   clonazePAM [facility]) 0.5 MG tablet [id number] Yes TAKE ONE TABLET BY MOUTH EVERYDAY AT BEDTIME [name], MD Taking Active   cyanocobalamin (V-R VITAMIN B-12) 500 MCG tablet [id number] Yes Take 1 tablet (500 mcg total) by mouth daily. [name], MD Taking Active   levocetirizine (XYZAL) 5 MG tablet [id number] Yes Take 5 mg by mouth every evening. Replaced Allegra, per patient report [provider] Taking Active Self  levothyroxine (SYNTHROID) 75 MCG tablet [id number] Yes Take 1 tablet (75 mcg total) by mouth daily before breakfast. [name], MD Taking Active   omeprazole (PRILOSEC) 40 MG capsule [id number] Yes Take 1 capsule (40 mg total) by mouth daily as needed.  Patient taking differently: Take 40 mg by mouth daily.    Tower, Wynelle Fanny, MD Taking Active   oxybutynin (DITROPAN-XL) 5 MG 24 hr tablet [id number] Yes Take 1 tablet (5 mg total) by mouth at bedtime. [name], MD Taking Active   sertraline (ZOLOFT) 100 MG tablet [id number] Yes Take 1.5 tablets (150 mg total) by mouth daily. 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   Palpitations 09/06/2018   Left lateral epicondylitis 12/06/2017   Hypothyroidism 08/18/2017   Hearing loss due to cerumen impaction 07/09/2017   Abnormal drug screen 05/30/2016   HLD (hyperlipidemia) 10/17/2015   Medicare annual wellness visit, subsequent 09/11/2014   Advanced care planning/counseling discussion 09/11/2014   Right thyroid nodule 09/11/2014   Pain in lateral portion of right knee 08/31/2014   Surgical menopause 08/21/2014   Perennial allergic rhinitis    MDD (major depressive disorder), recurrent episode, moderate (HCC)    Osteoarthritis    Hypertension    Prediabetes 06/30/2014   GAD 7 : Generalized Anxiety Score 01/20/2022 08/20/2021 08/08/2019  Nervous, Anxious, on Edge $Remov'1 1 3  'NgvFSO$ Control/stop worrying 0 1 3  Worry too much - different things 0 1 3  Trouble relaxing 0 2 3  Restless $RemoveB'1 3 3  'XIJtfpxs$ Easily annoyed or irritable 0 1 3  Afraid - awful might happen 0 0 0  Total GAD 7 Score $Remov'2 9 18  'DyVhRW$ Anxiety Difficulty Not difficult at all - -  Immunization History   Administered Date(s) Administered   PFIZER(Purple Top)SARS-COV-2 Vaccination 03/31/2020, 04/26/2020   Pneumococcal Conjugate-13 02/15/2017   Pneumococcal Polysaccharide-23 03/17/2018   Tdap 06/30/2014    Conditions to be addressed/monitored:  Hypertension, Hyperlipidemia, GERD, and Hypothyroidism  Care Plan : CCM Pharmacy Care Plan  Updates made by Phil Dopp, RPH since 01/20/2022 12:00 AM     Problem: CHL AMB "PATIENT-SPECIFIC PROBLEM"      Long-Range Goal: Disease Management   Start Date: 06/09/2021  This Visit's Progress: On track  Recent Progress: On track  Priority: High  Note:   Current Barriers:  None identified   Pharmacist Clinical Goal(s):  Patient will contact provider office for questions/concerns as evidenced notation of same in electronic health record through collaboration with PharmD and provider.   Interventions: 1:1 collaboration with Eustaquio Boyden, MD regarding development and update of comprehensive plan of care as evidenced by provider attestation and co-signature Inter-disciplinary care team collaboration (see longitudinal plan of care) Comprehensive medication review performed; medication list updated in electronic medical record  Hypertension (BP goal <140/90) -Controlled, per clinic readings  -Current treatment: Amlodipine 10 mg - 1 tablet daily -Medications previously tried: HCTZ - unable to tolerate  -Denies dizziness, imbalance, or falls.  -Recommended to continue current medication  Hyperlipidemia: (LDL goal < 100) -Not ideally controlled, but significant reduction with daily atorvastatin. She increased atorvastatin from 3 days per week to daily 07/08/21. Lipid panel updated 08/13/21. LDL improved from 179 to 103. History of myalgia.  -Current treatment: Atorvastatin 20 mg - 1 tablet every day -Medications previously tried: none reported  -Current exercise habits: Limits time on her feet due to hip pain or leg swelling/varicose veins.  No formal exercise. She cares for her mom often and stays on her feet. -Recommended to continue current medication  MDD with anxiety (Goal: Improve symptoms as evidenced by PHQ-9, GAD-7) -Controlled - GAD-7 today 2. Denies depressed mood. Sleeping very well with 6-7 hours from 12AM - 7AM. Denies nighttime awakenings.  -Current treatment  Sertraline 150 mg - take one tablet daily Clonazepam - takes 1 at bedtime every night  -Medications previously tried: none reported  -PHQ-2: 0 (today) -GAD-7: 2 (today) -Recommended to continue current medication  Mixed Urge and Stress Incontinence (Goal: Improve symptoms) -Controlled - pt restarted oxybutynin daily after last visit (around 07/07/21). She has not noticed any improvement in urinary symptoms. Symptoms primarily stress related - sneezing, squatting, etc. Due to no notable improvement we discussed 1 month trial off medication.  --Current treatment  Oxybutynin 5 mg XL - 1 tablet daily at bedtime (started daily 07/07/21) -Medications previously tried: none  -Recommended to hold oxybutynin 1 month starting February 04, 2022. Patient to let us know if symptoms worsen.   Hypothyroidism (Goal: Symptom control) -Controlled - per labs TSH WNL 07/2021, She is on generic levothyroxine. Takes it with other medications in morning. Does not separate. -Current treatment  Levothyroxine 75 mcg - 1 tablet daily  -Medications previously tried: none  -Recommended to continue current medication   Acid Reflux (Goal: Symptom Control) -Controlled - per pt report. Now taking daily due to globus sensation -Current treatment  Omeprazole 40 mg - 1 capsule daily -Medications previously tried: none  -Recommended to continue current medication  OTCs (in packs): Levocetirizine 5 mg daily Vitamin D3 2000 IU daily Vitamin B12 500 mcg daily   -All OTCs remain appropriate.  Patient Goals/Self-Care Activities Patient will:  - continue to take medications daily as  prescribed  Follow Up Plan: Telephone follow up appointment with care management team member scheduled for:  -CCM PharmD follow up 12 months -CMA calls monthly for adherence and delivery coordination - check in OAB symptoms off oxybutynin next month med sync     Medication Assistance: None required.  Patient affirms current coverage meets needs.  Compliance/Adherence/Medication fill history: Care Gaps: None  Star-Rating Drugs: Medication:                Last Fill:         Day Supply Atorvastatin 20mg        01/06/2022            90   Patient's preferred pharmacy is:  [facility] - [place], [place] - [address] [place] [address] Phone: [phone] Fax: [phone]  Uses pill box? No - uses pillpacks Pt endorses 100% compliance to daily medications   Care Plan and Follow Up Patient Decision:  Patient agrees to Care Plan and Follow-up.  [name], PharmD Clinical Pharmacist [facility] [phone]

## 2022-01-22 ENCOUNTER — Telehealth: Payer: Self-pay

## 2022-01-22 ENCOUNTER — Encounter: Payer: Self-pay | Admitting: Family Medicine

## 2022-01-22 ENCOUNTER — Other Ambulatory Visit: Payer: Self-pay | Admitting: Family Medicine

## 2022-01-22 NOTE — Telephone Encounter (Signed)
I spoke with pt; pt was seen by Dr Silvio Pate with pain in both legs on 01/08/22. Pt said that the pain in rt leg is worse; pt said it takes a while for pt to walk after sitting.pt has throbbing pain from rt hip to rt knee. Pain is mostly continuous but does come and go.  Rt leg still hurts even sitting. No known injury. Pt said pain is also at top of hip. Pain level now in rt leg is 0 when resting but when gets up to try to walk pain level is an 8. Pt said rt leg might be slightly swollen but no redness. Pt does not want to schedule a FU appt because pt said nothing can be done by just looking and pt thinks she needs some studies done. Pt request cb after reviewed by Dr Darnell Level. Sending note to Dr Darnell Level and Lattie Haw CMA.

## 2022-01-22 NOTE — Telephone Encounter (Signed)
Name of Medication: Clonazepam Name of Pharmacy: [facility] Last Fill or Written Date and Quantity: 01/02/22, #30 Last Office Visit and Type: 08/20/21, AWV Next Office Visit and Type: none Last Controlled Substance Agreement Date: 06/08/16 Last UDS: 06/08/16

## 2022-01-22 NOTE — Telephone Encounter (Signed)
I do recommend OV evaluation. Offered 2pm tomorrow. Plz check to see if she's available to come in then.

## 2022-01-22 NOTE — Progress Notes (Signed)
Chronic Care Management Pharmacy Assistant   Name: Donna Vargas  MRN: [id number] DOB: May 03, 1946  Reason for Encounter: CCM (Medication Adherence and Delivery Coordination)   Recent office visits:  None since last CCM contact  Recent consult visits:  None since last CCM contact  Hospital visits:  None in previous 6 months  Medications: Outpatient Encounter Medications as of 01/22/2022  Medication Sig   amLODipine (NORVASC) 10 MG tablet Take 1 tablet (10 mg total) by mouth daily.   atorvastatin (LIPITOR) 20 MG tablet Take 1 tablet (20 mg total) by mouth daily.   Cholecalciferol (VITAMIN D3) 25 MCG (1000 UT) CAPS Take 2 capsules (2,000 Units total) by mouth daily.   clonazePAM (KLONOPIN) 0.5 MG tablet TAKE ONE TABLET BY MOUTH EVERYDAY AT BEDTIME   cyanocobalamin (V-R VITAMIN B-12) 500 MCG tablet Take 1 tablet (500 mcg total) by mouth daily.   levocetirizine (XYZAL) 5 MG tablet Take 5 mg by mouth every evening. Replaced Allegra, per patient report   levothyroxine (SYNTHROID) 75 MCG tablet Take 1 tablet (75 mcg total) by mouth daily before breakfast.   omeprazole (PRILOSEC) 40 MG capsule Take 1 capsule (40 mg total) by mouth daily as needed. (Patient taking differently: Take 40 mg by mouth daily.)   oxybutynin (DITROPAN-XL) 5 MG 24 hr tablet Take 1 tablet (5 mg total) by mouth at bedtime.   sertraline (ZOLOFT) 100 MG tablet Take 1.5 tablets (150 mg total) by mouth daily.   No facility-administered encounter medications on file as of 01/22/2022.   BP Readings from Last 3 Encounters:  01/08/22 120/76  11/26/21 126/74  08/20/21 137/78    Lab Results  Component Value Date   HGBA1C 6.3 08/13/2021    No OVs, Consults, or hospital visits since last care coordination call / Pharmacist visit. No medication changes indicated  Last adherence delivery date: 01/02/2022     Patient is due for next adherence delivery on: 02/03/2022  Spoke with patient on 01/22/2022 reviewed  medications and coordinated delivery.  This delivery to include: Adherence Packaging  30 Days  Packs: Sertraline 100 mg - take 1.5 tablet at breakfast Sertraline 50 mg - take 1 tablet at breakfast Clonazepam 0.5mg  - 1 tablet bedtime  Levocetirizine 5mg  - 1 tablet bedtime Vitamin D3 2000 IU - 1 tablet breakfast Vitamin B12 500 mcg - 1 tablet breakfast Omeprazole 40 mg - Take 1 capsule at breakfast Amlodipine 10 mg - 1 tablet breakfast  Levothyroxine 75 mcg - 1 tablet daily breakfast Atorvastatin 20 mg - 1 tablet daily at breakfast    Patient declined the following medications this month: None declined  Per [name] -  HOLD Oxybutynin 5 mg XL - 1 tablet daily at bedtime for the month of March.   Any concerns about your medications? No  How often do you forget or accidentally miss a dose? Rarely  Do you use a pillbox? No  Is patient in packaging Yes  If yes  What is the date on your next pill pack? 01/22/2022 bedtime  Any concerns or issues with your packaging? No  Refills requested from providers include: Clonazepam 0.5mg  - 1 tablet bedtime   Confirmed delivery date of 02/03/2022, advised patient that pharmacy will contact them the morning of delivery.   Recent blood pressure readings are as follows: Patient states she does not check it at home.   Recent blood glucose readings are as follows:  Patient states she does not check it at home.   Annual  wellness visit in last year? Yes 08/20/2021 Most Recent BP reading: 120/76 on 01/08/2022  Charlene Brooke, CPP notified  [name], [place] [phone]  Time Spent: 40 Minutes

## 2022-01-22 NOTE — Telephone Encounter (Addendum)
Spoke with pt offering OV tomorrow at 2:00.  Pt agrees.  OV scheduled. Fyi to Dr. Darnell Level.

## 2022-01-23 ENCOUNTER — Ambulatory Visit (INDEPENDENT_AMBULATORY_CARE_PROVIDER_SITE_OTHER): Payer: Medicare Other | Admitting: Family Medicine

## 2022-01-23 ENCOUNTER — Other Ambulatory Visit: Payer: Self-pay

## 2022-01-23 ENCOUNTER — Encounter: Payer: Self-pay | Admitting: Family Medicine

## 2022-01-23 VITALS — BP 132/70 | HR 77 | Temp 97.5°F | Ht 62.25 in | Wt 154.4 lb

## 2022-01-23 DIAGNOSIS — M79651 Pain in right thigh: Secondary | ICD-10-CM

## 2022-01-23 NOTE — Patient Instructions (Addendum)
One blood test today. Try stretching exercises provided today.  For lateral thigh pain - may use heating pad or ice in a towel to tender areas lateral right leg. Take ibuprofen 400mg  twice daily with meals for 5-7 days then as needed. Let me know if not improved with this for further evaluation.

## 2022-01-23 NOTE — Progress Notes (Signed)
Patient ID: Donna Vargas, female    DOB: 02/01/1946, 76 y.o.   MRN: [id number]  This visit was conducted in person.  BP 132/70    Pulse 77    Temp (!) 97.5 F (36.4 C) (Temporal)    Ht 5' 2.25" (1.581 m)    Wt 154 lb 6 oz (70 kg)    SpO2 95%    BMI 28.01 kg/m    CC: bilateral leg pain Subjective:   HPI: Donna Vargas is a 76 y.o. female presenting on 01/23/2022 for Leg Pain (C/o bilateral leg pain, worse in R leg.  )   Saw Dr Silvio Pate earlier this month with bilateral leg pain x1 day associated with body aches. ?viral myalgia - that was quickly improving.   She endorses 3 wks of significant R lateral leg pain that starts at R hip and travels to knee. Worse pain after prolonged sitting when she first gets up. Notes difficulty bearing weight especially when going up and down stairs. Also notes pain to lateral hip when laying on right side. No significant L leg pain. States sole of left foot stays numb - this is a chronic issue for 2-3 years. Has been using ibuprofen 400mg  once daily PRN for discomfort - with benefit.   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   No shooting pain past the knee.  No numbness/weakness or burning pain of lateral leg.  Denies inciting trauma/injury or falls.  No lower back pain, fevers/chills, bowel/bladder incontinence.  No muscle cramping.   MR lumbar spine without contrast IMPRESSION 07/2019: 1. At L2-3 there is a broad-based disc bulge with a left paracentral/foraminal disc protrusion with mass effect on the left intraspinal L3 nerve root. Mild bilateral facet arthropathy. Mild left foraminal stenosis.     Relevant past medical, surgical, family and social history reviewed and updated as indicated. Interim medical history since our last visit reviewed. Allergies and medications reviewed and updated. Outpatient Medications Prior to Visit  Medication Sig Dispense Refill   amLODipine (NORVASC) 10 MG tablet  Take 1 tablet (10 mg total) by mouth daily. 90 tablet 3   atorvastatin (LIPITOR) 20 MG tablet Take 1 tablet (20 mg total) by mouth daily. 90 tablet 3   Cholecalciferol (VITAMIN D3) 25 MCG (1000 UT) CAPS Take 2 capsules (2,000 Units total) by mouth daily.     clonazePAM (KLONOPIN) 0.5 MG tablet TAKE ONE TABLET BY MOUTH EVERYDAY AT BEDTIME 30 tablet 1   cyanocobalamin (V-R VITAMIN B-12) 500 MCG tablet Take 1 tablet (500 mcg total) by mouth daily.     levocetirizine (XYZAL) 5 MG tablet Take 5 mg by mouth every evening. Replaced Allegra, per patient report     levothyroxine (SYNTHROID) 75 MCG tablet Take 1 tablet (75 mcg total) by mouth daily before breakfast. 90 tablet 3   omeprazole (PRILOSEC) 40 MG capsule Take 1 capsule (40 mg total) by mouth daily as needed. (Patient taking differently: Take 40 mg by mouth daily.) 90 capsule 3   oxybutynin (DITROPAN-XL) 5 MG 24 hr tablet Take 1 tablet (5 mg total) by mouth at bedtime. 90 tablet 3   sertraline (ZOLOFT) 100 MG tablet Take 1.5 tablets (150 mg total) by mouth daily. 135 tablet 3   No facility-administered medications prior to visit.     Per HPI unless specifically indicated in ROS section below Review of Systems  Objective:  BP 132/70    Pulse 77  Temp (!) 97.5 F (36.4 C) (Temporal)    Ht 5' 2.25" (1.581 m)    Wt 154 lb 6 oz (70 kg)    SpO2 95%    BMI 28.01 kg/m   Wt Readings from Last 3 Encounters:  01/23/22 154 lb 6 oz (70 kg)  01/08/22 154 lb (69.9 kg)  11/26/21 154 lb 8 oz (70.1 kg)      Physical Exam Vitals and nursing note reviewed.  Constitutional:      Appearance: Normal appearance. She is not ill-appearing.  Musculoskeletal:        General: Tenderness (point tender to palpation at right mid posteriolateral thigh) present. Normal range of motion.     Right lower leg: No edema.     Left lower leg: No edema.       Legs:     Comments:  No pain midline spine No paraspinous mm tenderness Neg SLR bilaterally. No pain with  int/ext rotation at hip. Neg FABER. No pain at SIJ, GTB bilaterally.  Discomfort to palpation at R sciatic notch No reproducible pain with testing hip flexors/abductors/adductors against resistance   Skin:    General: Skin is warm and dry.     Findings: No erythema or rash.  Neurological:     Mental Status: She is alert.     Deep Tendon Reflexes:     Reflex Scores:      Patellar reflexes are 2+ on the right side and 2+ on the left side.      Achilles reflexes are 2+ on the right side and 2+ on the left side.    Comments:  DTRs symmetrically brisk and equal 5/5 strength BLE Sensation intact  Psychiatric:        Mood and Affect: Mood normal.        Behavior: Behavior normal.      Results for orders placed or performed in visit on 01/23/22  CK  Result Value Ref Range   Total CK 48 29 - 143 U/L   Assessment & Plan:  This visit occurred during the SARS-CoV-2 public health emergency.  Safety protocols were in place, including screening questions prior to the visit, additional usage of staff PPE, and extensive cleaning of exam room while observing appropriate contact time as indicated for disinfecting solutions.   Problem List Items Addressed This Visit     Pain in right thigh - Primary    Unclear etiology of R lateral/posterior mid thigh pain present for the past 3 weeks without inciting associated trauma or injury. Not consistent with DVT, meralgia paresthetica, IT band syndrome, lumbar radiculopathy, or hip or knee pathology. Overall benign exam with brisk reflexes, no obvious mass.  Will treat conservatively with NSAID, ice/heat, and try exercises for piriformis syndrome given reproducible pain at sciatic notch. Check CPK in statin use.  If ongoing, consider R femur xray and possible lumbar imaging. If normal, would refer to PT.       Relevant Orders   CK (Completed)     No orders of the defined types were placed in this encounter.  Orders Placed This Encounter  Procedures    CK     Patient Instructions  One blood test today. Try stretching exercises provided today.  For lateral thigh pain - may use heating pad or ice in a towel to tender areas lateral right leg. Take ibuprofen 400mg  twice daily with meals for 5-7 days then as needed. Let me know if not improved with this for further evaluation.  Follow up plan: Return if symptoms worsen or fail to improve.  [name], MD

## 2022-01-24 LAB — CK: Total CK: 48 U/L (ref 29–143)

## 2022-01-24 NOTE — Assessment & Plan Note (Addendum)
Unclear etiology of R lateral/posterior mid thigh pain present for the past 3 weeks without inciting associated trauma or injury. Not consistent with DVT, meralgia paresthetica, IT band syndrome, lumbar radiculopathy, or hip or knee pathology. Overall benign exam with brisk reflexes, no obvious mass.  Will treat conservatively with NSAID, ice/heat, and try exercises for piriformis syndrome given reproducible pain at sciatic notch. Check CPK in statin use.  If ongoing, consider R femur xray and possible lumbar imaging. If normal, would refer to PT.

## 2022-01-26 NOTE — Telephone Encounter (Signed)
ERx 

## 2022-01-27 DIAGNOSIS — F3289 Other specified depressive episodes: Secondary | ICD-10-CM | POA: Diagnosis not present

## 2022-01-27 DIAGNOSIS — E039 Hypothyroidism, unspecified: Secondary | ICD-10-CM | POA: Diagnosis not present

## 2022-01-27 DIAGNOSIS — E785 Hyperlipidemia, unspecified: Secondary | ICD-10-CM

## 2022-01-27 DIAGNOSIS — I10 Essential (primary) hypertension: Secondary | ICD-10-CM

## 2022-01-29 DIAGNOSIS — C44629 Squamous cell carcinoma of skin of left upper limb, including shoulder: Secondary | ICD-10-CM | POA: Diagnosis not present

## 2022-02-06 ENCOUNTER — Telehealth: Payer: Self-pay

## 2022-02-06 DIAGNOSIS — M79651 Pain in right thigh: Secondary | ICD-10-CM

## 2022-02-06 DIAGNOSIS — G8929 Other chronic pain: Secondary | ICD-10-CM

## 2022-02-06 NOTE — Telephone Encounter (Signed)
Patient called would like xray order placed. She has not had improvement in pain in right leg has not improved. Would like to know once placed and where she needs to go to have done.  ?

## 2022-02-06 NOTE — Addendum Note (Signed)
Addended by: [name] on: 02/06/2022 04:03 PM ? ? Modules accepted: Orders ? ?

## 2022-02-06 NOTE — Telephone Encounter (Signed)
Spoke with pt relaying Dr. G's message.  Pt verbalizes understanding and expresses her thanks.  

## 2022-02-06 NOTE — Telephone Encounter (Signed)
Plz notify I ordered R leg and lower back xrays.  ?She may come in at her convenience next week to get this done at [facility].  ?

## 2022-02-09 ENCOUNTER — Ambulatory Visit (INDEPENDENT_AMBULATORY_CARE_PROVIDER_SITE_OTHER)
Admission: RE | Admit: 2022-02-09 | Discharge: 2022-02-09 | Disposition: A | Discharge status: Home / Self Care | Payer: Medicare Other | Source: Ambulatory Visit | Attending: Family Medicine | Admitting: Family Medicine

## 2022-02-09 DIAGNOSIS — M5441 Lumbago with sciatica, right side: Secondary | ICD-10-CM

## 2022-02-09 DIAGNOSIS — G8929 Other chronic pain: Secondary | ICD-10-CM | POA: Diagnosis not present

## 2022-02-09 DIAGNOSIS — M79651 Pain in right thigh: Secondary | ICD-10-CM

## 2022-02-09 DIAGNOSIS — M5442 Lumbago with sciatica, left side: Secondary | ICD-10-CM

## 2022-02-11 ENCOUNTER — Encounter: Payer: Self-pay | Admitting: Family Medicine

## 2022-02-11 DIAGNOSIS — M47816 Spondylosis without myelopathy or radiculopathy, lumbar region: Secondary | ICD-10-CM | POA: Insufficient documentation

## 2022-02-24 ENCOUNTER — Telehealth: Payer: Self-pay

## 2022-02-24 NOTE — Progress Notes (Signed)
? ? ?Chronic Care Management ?Pharmacy Assistant  ? ?Name: Donna Vargas  MRN: [id number] DOB: Mar 24, 1946 ? ?Reason for Encounter: CCM (Medication Adherence and Delivery Coordination) ?  ?Recent office visits:  ?02/06/2022 Orders: DG Femur and DG Lumbar Spine ?01/23/2022 [name], MD Leg Pain. No med changes.  ? ?Recent consult visits:  ?None since last CCM contact ? ?Hospital visits:  ?None in previous 6 months ? ?Medications: ?Outpatient Encounter Medications as of 02/24/2022  ?Medication Sig  ? amLODipine (NORVASC) 10 MG tablet Take 1 tablet (10 mg total) by mouth daily.  ? atorvastatin (LIPITOR) 20 MG tablet Take 1 tablet (20 mg total) by mouth daily.  ? Cholecalciferol (VITAMIN D3) 25 MCG (1000 UT) CAPS Take 2 capsules (2,000 Units total) by mouth daily.  ? clonazePAM (KLONOPIN) 0.5 MG tablet TAKE ONE TABLET BY MOUTH EVERYDAY AT BEDTIME  ? cyanocobalamin (V-R VITAMIN B-12) 500 MCG tablet Take 1 tablet (500 mcg total) by mouth daily.  ? levocetirizine (XYZAL) 5 MG tablet Take 5 mg by mouth every evening. Replaced Allegra, per patient report  ? levothyroxine (SYNTHROID) 75 MCG tablet Take 1 tablet (75 mcg total) by mouth daily before breakfast.  ? omeprazole (PRILOSEC) 40 MG capsule Take 1 capsule (40 mg total) by mouth daily as needed. (Patient taking differently: Take 40 mg by mouth daily.)  ? oxybutynin (DITROPAN-XL) 5 MG 24 hr tablet Take 1 tablet (5 mg total) by mouth at bedtime.  ? sertraline (ZOLOFT) 100 MG tablet Take 1.5 tablets (150 mg total) by mouth daily.  ? ?No facility-administered encounter medications on file as of 02/24/2022.  ? ?BP Readings from Last 3 Encounters:  ?01/23/22 132/70  ?01/08/22 120/76  ?11/26/21 126/74  ?  ?Lab Results  ?Component Value Date  ? HGBA1C 6.3 08/13/2021  ?  ?Recent OV, Consult or Hospital visit:  ?No medication changes indicated ? ?Last adherence delivery date:  02/03/2022     ? ?Patient is due for next adherence delivery on: 03/04/2022 ? ?Spoke with  patient on 02/24/2022 reviewed medications and coordinated delivery. ? ?This delivery to include: Adherence Packaging  30 Days  ?Packs: ?Sertraline 100 mg - take 1.5 tablet at breakfast ?Sertraline 50 mg - take 1 tablet at breakfast ?Clonazepam 0.'5mg'$  - 1 tablet bedtime  ?Levocetirizine '5mg'$  - 1 tablet bedtime ?Vitamin D3 2000 IU - 1 tablet breakfast ?Vitamin B12 500 mcg - 1 tablet breakfast ?Omeprazole 40 mg - Take 1 capsule at breakfast ?Amlodipine 10 mg - 1 tablet breakfast  ?Levothyroxine 75 mcg - 1 tablet daily breakfast ?Atorvastatin 20 mg - 1 tablet daily at breakfast  ?  ?Patient declined the following medications this month: Patient stated she would like to remain off of the Oxybutynin 5 mg as she can not tell a difference as to when she was on it verses when she was off of it for the month of March.  ?Oxybutynin 5 mg XL - 1 tablet daily at bedtime ? ?Any concerns about your medications? No ? ?How often do you forget or accidentally miss a dose? Rarely ? ?Do you use a pillbox? No ? ?Is patient in packaging Yes ? If yes ? What is the date on your next pill pack? 02/24/2022 bedtime ? Any concerns or issues with your packaging? No ? ?No refill request needed. ? ?Confirmed delivery date of 03/04/2022, advised patient that pharmacy will contact them the morning of delivery.  ? ?Recent blood pressure readings are as follows: Patient states she does not check  it at home.  ? ?Annual wellness visit in last year? Yes 08/20/2021 ?Most Recent BP reading: 120/76 on 01/08/2022 ?  ?Charlene Brooke, CPP notified ? ?[name], RMA ?Clinical Pharmacy Assistant ?[phone] ? ? ?

## 2022-02-27 DIAGNOSIS — R490 Dysphonia: Secondary | ICD-10-CM | POA: Diagnosis not present

## 2022-02-27 DIAGNOSIS — J301 Allergic rhinitis due to pollen: Secondary | ICD-10-CM | POA: Diagnosis not present

## 2022-02-27 DIAGNOSIS — H6123 Impacted cerumen, bilateral: Secondary | ICD-10-CM | POA: Diagnosis not present

## 2022-02-27 DIAGNOSIS — K219 Gastro-esophageal reflux disease without esophagitis: Secondary | ICD-10-CM | POA: Diagnosis not present

## 2022-03-23 ENCOUNTER — Other Ambulatory Visit: Payer: Self-pay | Admitting: Family Medicine

## 2022-03-23 NOTE — Telephone Encounter (Signed)
ERx 

## 2022-03-23 NOTE — Telephone Encounter (Signed)
Name of Medication: Clonazepam ?Name of Pharmacy: [facility] ?Last Fill or Written Date and Quantity: 03/03/22, #30 ?Last Office Visit and Type: 01/23/22, R thigh pain ?Next Office Visit and Type: none ?Last Controlled Substance Agreement Date: 06/08/16 ?Last UDS: 05/1016 ? ? ?

## 2022-03-24 ENCOUNTER — Telehealth: Payer: Self-pay

## 2022-03-24 NOTE — Progress Notes (Signed)
? ? ?  Chronic Care Management ?Pharmacy Assistant  ? ?Name: Donna Vargas  MRN: [id number] DOB: October 10, 1946 ? ?Reason for Encounter: CCM (Medication Adherence and Delivery Coordination) ?  ?Recent office visits:  ?None since last CCM contact ? ?Recent consult visits:  ?None since last CCM contact ? ?Hospital visits:  ?None since last CCM contact ? ?Medications: ?Outpatient Encounter Medications as of 03/24/2022  ?Medication Sig  ? clonazePAM (KLONOPIN) 0.5 MG tablet TAKE ONE TABLET BY MOUTH EVERYDAY AT BEDTIME  ? amLODipine (NORVASC) 10 MG tablet Take 1 tablet (10 mg total) by mouth daily.  ? atorvastatin (LIPITOR) 20 MG tablet Take 1 tablet (20 mg total) by mouth daily.  ? Cholecalciferol (VITAMIN D3) 25 MCG (1000 UT) CAPS Take 2 capsules (2,000 Units total) by mouth daily.  ? cyanocobalamin (V-R VITAMIN B-12) 500 MCG tablet Take 1 tablet (500 mcg total) by mouth daily.  ? levocetirizine (XYZAL) 5 MG tablet Take 5 mg by mouth every evening. Replaced Allegra, per patient report  ? levothyroxine (SYNTHROID) 75 MCG tablet Take 1 tablet (75 mcg total) by mouth daily before breakfast.  ? omeprazole (PRILOSEC) 40 MG capsule Take 1 capsule (40 mg total) by mouth daily as needed. (Patient taking differently: Take 40 mg by mouth daily.)  ? oxybutynin (DITROPAN-XL) 5 MG 24 hr tablet Take 1 tablet (5 mg total) by mouth at bedtime.  ? sertraline (ZOLOFT) 100 MG tablet Take 1.5 tablets (150 mg total) by mouth daily.  ? ?No facility-administered encounter medications on file as of 03/24/2022.  ? ?BP Readings from Last 3 Encounters:  ?01/23/22 132/70  ?01/08/22 120/76  ?11/26/21 126/74  ?  ?Lab Results  ?Component Value Date  ? HGBA1C 6.3 08/13/2021  ?  ?No OVs, Consults, or hospital visits since last care coordination call / Pharmacist visit. ?No medication changes indicated ? ?Last adherence delivery date: 03/04/2022     ? ?Patient is due for next adherence delivery on:  04/02/2022 ? ?Spoke with patient on 03/24/2022  reviewed medications and coordinated delivery. ? ?This delivery to include: Adherence Packaging  30 Days  ?Packs: ?Clonazepam 0.'5mg'$  - 1 tablet bedtime  ?Levocetirizine '5mg'$  - 1 tablet bedtime ?Vitamin D3 2000 IU - 1 tablet breakfast ?Vitamin B12 500 mcg - 1 tablet breakfast ?Sertraline 50 mg - take 1 tablet at breakfast ?Omeprazole 40 mg - Take 1 capsule at breakfast ?Sertraline 100 mg - take 1.5 tablet at breakfast ?Amlodipine 10 mg - 1 tablet breakfast  ?Levothyroxine 75 mcg - 1 tablet daily breakfast ?Atorvastatin 20 mg - 1 tablet daily at breakfast  ? ?Patient declined the following medications this month: ?None declined ? ?Any concerns about your medications? No ? ?How often do you forget or accidentally miss a dose? Rarely ? ?Do you use a pillbox? No ? ?Is patient in packaging Yes ? If yes ? What is the date on your next pill pack? 03/24/2022 ? Any concerns or issues with your packaging? No ? ?Refills requested from providers include: ?Clonazepam 0.'5mg'$  - 1 tablet bedtime  ? ?Confirmed delivery date of 04/02/2022, advised patient that pharmacy will contact them the morning of delivery.  ? ?Recent blood pressure readings are as follows:  Patient states she does not check it at home.  ? ?Annual wellness visit in last year? Yes 08/20/2021 ?Most Recent BP reading: 120/76 on 01/08/2022 ?  ?Charlene Brooke, CPP notified ?  ?[name], RMA ?Clinical Pharmacy Assistant ?[phone] ?  ?

## 2022-04-15 DIAGNOSIS — L905 Scar conditions and fibrosis of skin: Secondary | ICD-10-CM | POA: Diagnosis not present

## 2022-04-15 DIAGNOSIS — Z08 Encounter for follow-up examination after completed treatment for malignant neoplasm: Secondary | ICD-10-CM | POA: Diagnosis not present

## 2022-04-15 DIAGNOSIS — Z85828 Personal history of other malignant neoplasm of skin: Secondary | ICD-10-CM | POA: Diagnosis not present

## 2022-04-16 ENCOUNTER — Telehealth: Payer: Self-pay | Admitting: Family Medicine

## 2022-04-16 DIAGNOSIS — G8929 Other chronic pain: Secondary | ICD-10-CM

## 2022-04-16 DIAGNOSIS — M47816 Spondylosis without myelopathy or radiculopathy, lumbar region: Secondary | ICD-10-CM

## 2022-04-16 DIAGNOSIS — M25561 Pain in right knee: Secondary | ICD-10-CM

## 2022-04-16 DIAGNOSIS — M79651 Pain in right thigh: Secondary | ICD-10-CM

## 2022-04-16 NOTE — Telephone Encounter (Signed)
Reason for Referral Request: Hips and legs  Has patient been seen PCP for this complaint? Yes  Patient scheduled on: Last seen on 2.24.2023  Referral for which specialty: Orthopedics  Preferred office/provider: [facility]. Address: [place], [place] [address] Phone Number: [phone]

## 2022-04-17 NOTE — Telephone Encounter (Signed)
Ortho referral placed.  May take 2 wks to get her scheduled. If she prefers, she may also call to schedule appt at [phone]

## 2022-04-17 NOTE — Telephone Encounter (Signed)
Spoke with pt relaying Dr. G's message.  Pt verbalizes understanding and expresses her thanks.  

## 2022-04-22 ENCOUNTER — Telehealth: Payer: Self-pay

## 2022-04-22 NOTE — Progress Notes (Unsigned)
    Chronic Care Management Pharmacy Assistant   Name: Donna Vargas  MRN: [id number] DOB: 12-28-1945  Reason for Encounter: CCM (Medication Adherence and Delivery Coordination)  Recent office visits:  None since last CCM contact  Recent consult visits:  None since last CCM contact  Hospital visits:  None in previous 6 months  Medications: Outpatient Encounter Medications as of 04/22/2022  Medication Sig   amLODipine (NORVASC) 10 MG tablet Take 1 tablet (10 mg total) by mouth daily.   atorvastatin (LIPITOR) 20 MG tablet Take 1 tablet (20 mg total) by mouth daily.   Cholecalciferol (VITAMIN D3) 25 MCG (1000 UT) CAPS Take 2 capsules (2,000 Units total) by mouth daily.   clonazePAM (KLONOPIN) 0.5 MG tablet TAKE ONE TABLET BY MOUTH EVERYDAY AT BEDTIME   cyanocobalamin (V-R VITAMIN B-12) 500 MCG tablet Take 1 tablet (500 mcg total) by mouth daily.   levocetirizine (XYZAL) 5 MG tablet Take 5 mg by mouth every evening. Replaced Allegra, per patient report   levothyroxine (SYNTHROID) 75 MCG tablet Take 1 tablet (75 mcg total) by mouth daily before breakfast.   omeprazole (PRILOSEC) 40 MG capsule Take 1 capsule (40 mg total) by mouth daily as needed. (Patient taking differently: Take 40 mg by mouth daily.)   oxybutynin (DITROPAN-XL) 5 MG 24 hr tablet Take 1 tablet (5 mg total) by mouth at bedtime.   sertraline (ZOLOFT) 100 MG tablet Take 1.5 tablets (150 mg total) by mouth daily.   No facility-administered encounter medications on file as of 04/22/2022.   BP Readings from Last 3 Encounters:  01/23/22 132/70  01/08/22 120/76  11/26/21 126/74    Lab Results  Component Value Date   HGBA1C 6.3 08/13/2021    No OVs, Consults, or hospital visits since last care coordination call / Pharmacist visit. No medication changes indicated  Last adherence delivery date: 04/02/2022      Patient is due for next adherence delivery on: 05/04/2022  {Med Review:25223}  This delivery to  include: Adherence Packaging  30 Days  Packs: Clonazepam 0.'5mg'$  - 1 tablet bedtime  Levocetirizine '5mg'$  - 1 tablet bedtime Vitamin D3 2000 IU - 1 tablet breakfast Vitamin B12 500 mcg - 1 tablet breakfast Sertraline 50 mg - take 1 tablet at breakfast Omeprazole 40 mg - Take 1 capsule at breakfast Sertraline 100 mg - take 1.5 tablet at breakfast Amlodipine 10 mg - 1 tablet breakfast  Levothyroxine 75 mcg - 1 tablet daily breakfast Atorvastatin 20 mg - 1 tablet daily at breakfast    Patient declined the following medications this month: None declined   Any concerns about your medications? No   How often do you forget or accidentally miss a dose? Rarely   Do you use a pillbox? No  Is patient in packaging Yes  If yes  What is the date on your next pill pack?  Any concerns or issues with your packaging?  No refill request needed.  {Delivery XTGG:26948} 06/05  Recent blood pressure readings are as follows:  Patient states she does not check it at home.    Annual wellness visit in last year? Yes 08/20/2021 Most Recent BP reading: 120/76 on 01/08/2022   Charlene Brooke, CPP notified   [name], [place] Clinical Pharmacy Assistant [phone]   [facility] 05/24

## 2022-04-23 DIAGNOSIS — M545 Low back pain, unspecified: Secondary | ICD-10-CM | POA: Diagnosis not present

## 2022-04-23 DIAGNOSIS — M48061 Spinal stenosis, lumbar region without neurogenic claudication: Secondary | ICD-10-CM | POA: Diagnosis not present

## 2022-05-11 DIAGNOSIS — M5416 Radiculopathy, lumbar region: Secondary | ICD-10-CM | POA: Diagnosis not present

## 2022-05-13 DIAGNOSIS — M5416 Radiculopathy, lumbar region: Secondary | ICD-10-CM | POA: Diagnosis not present

## 2022-05-19 DIAGNOSIS — M5416 Radiculopathy, lumbar region: Secondary | ICD-10-CM | POA: Diagnosis not present

## 2022-05-21 ENCOUNTER — Telehealth: Payer: Self-pay

## 2022-05-21 DIAGNOSIS — M5416 Radiculopathy, lumbar region: Secondary | ICD-10-CM | POA: Diagnosis not present

## 2022-05-21 NOTE — Progress Notes (Cosign Needed Addendum)
    Chronic Care Management Pharmacy Assistant   Name: Nyema Hachey  MRN: [id number] DOB: [birth year]  Reason for Encounter: CCM (Medication Adherence and Delivery Coordination)  Recent office visits:  None since last CCM contact  Recent consult visits:  None since last CCM contact  Hospital visits:  None in previous 6 months  Medications: Outpatient Encounter Medications as of 05/21/2022  Medication Sig   amLODipine (NORVASC) 10 MG tablet Take 1 tablet (10 mg total) by mouth daily.   atorvastatin (LIPITOR) 20 MG tablet Take 1 tablet (20 mg total) by mouth daily.   Cholecalciferol (VITAMIN D3) 25 MCG (1000 UT) CAPS Take 2 capsules (2,000 Units total) by mouth daily.   clonazePAM (KLONOPIN) 0.5 MG tablet TAKE ONE TABLET BY MOUTH EVERYDAY AT BEDTIME   cyanocobalamin (V-R VITAMIN B-12) 500 MCG tablet Take 1 tablet (500 mcg total) by mouth daily.   levocetirizine (XYZAL) 5 MG tablet Take 5 mg by mouth every evening. Replaced Allegra, per patient report   levothyroxine (SYNTHROID) 75 MCG tablet Take 1 tablet (75 mcg total) by mouth daily before breakfast.   omeprazole (PRILOSEC) 40 MG capsule Take 1 capsule (40 mg total) by mouth daily as needed. (Patient taking differently: Take 40 mg by mouth daily.)   oxybutynin (DITROPAN-XL) 5 MG 24 hr tablet Take 1 tablet (5 mg total) by mouth at bedtime.   sertraline (ZOLOFT) 100 MG tablet Take 1.5 tablets (150 mg total) by mouth daily.   No facility-administered encounter medications on file as of 05/21/2022.   BP Readings from Last 3 Encounters:  01/23/22 132/70  01/08/22 120/76  11/26/21 126/74    Lab Results  Component Value Date   HGBA1C 6.3 08/13/2021    No OVs, Consults, or hospital visits since last care coordination call / Pharmacist visit. No medication changes indicated  Last adherence delivery date:  05/04/2022      Patient is due for next adherence delivery on:    Spoke with patient on 05/21/2022 reviewed  medications and coordinated delivery.  This delivery to include: Adherence Packaging  30 Days  Packs: Clonazepam 0.'5mg'$  - 1 tablet bedtime  Levocetirizine '5mg'$  - 1 tablet bedtime Vitamin D3 2000 IU - 1 tablet breakfast Vitamin B12 500 mcg - 1 tablet breakfast Sertraline 50 mg - take 1 tablet at breakfast Omeprazole 40 mg - Take 1 capsule at breakfast Sertraline 100 mg - take 1.5 tablet at breakfast Amlodipine 10 mg - 1 tablet breakfast  Levothyroxine 75 mcg - 1 tablet daily breakfast Atorvastatin 20 mg - 1 tablet daily at breakfast    Patient declined the following medications this month: None declined   Any concerns about your medications? No   How often do you forget or accidentally miss a dose? Rarely   Do you use a pillbox? No   Is patient in packaging Yes  If yes  What is the date on your next pill pack? 05/21/2022  Any concerns or issues with your packaging? Bedtime  Refills requested from providers include: Clonazepam 0.'5mg'$  - 1 tablet bedtime   Confirmed delivery date of 06/02/2022, advised patient that pharmacy will contact them the morning of delivery.  Recent blood pressure readings are as follows:  Patient states she does not check it at home.    Annual wellness visit in last year? Yes 08/20/2021 Most Recent BP reading: 120/76 on 01/08/2022   Charlene Brooke, CPP notified   [name], [place] Pharmacy Assistant [phone]  CPP Review

## 2022-05-24 ENCOUNTER — Other Ambulatory Visit: Payer: Self-pay | Admitting: Family Medicine

## 2022-05-25 DIAGNOSIS — M5416 Radiculopathy, lumbar region: Secondary | ICD-10-CM | POA: Diagnosis not present

## 2022-05-27 NOTE — Telephone Encounter (Signed)
ERx 

## 2022-05-28 DIAGNOSIS — M5416 Radiculopathy, lumbar region: Secondary | ICD-10-CM | POA: Diagnosis not present

## 2022-06-08 DIAGNOSIS — M5416 Radiculopathy, lumbar region: Secondary | ICD-10-CM | POA: Diagnosis not present

## 2022-06-10 DIAGNOSIS — M5416 Radiculopathy, lumbar region: Secondary | ICD-10-CM | POA: Diagnosis not present

## 2022-06-15 DIAGNOSIS — M5416 Radiculopathy, lumbar region: Secondary | ICD-10-CM | POA: Diagnosis not present

## 2022-06-18 ENCOUNTER — Other Ambulatory Visit: Payer: Self-pay | Admitting: Family Medicine

## 2022-06-18 DIAGNOSIS — M545 Low back pain, unspecified: Secondary | ICD-10-CM | POA: Diagnosis not present

## 2022-06-18 DIAGNOSIS — M7061 Trochanteric bursitis, right hip: Secondary | ICD-10-CM | POA: Diagnosis not present

## 2022-06-18 DIAGNOSIS — M48061 Spinal stenosis, lumbar region without neurogenic claudication: Secondary | ICD-10-CM | POA: Diagnosis not present

## 2022-06-18 DIAGNOSIS — M5416 Radiculopathy, lumbar region: Secondary | ICD-10-CM | POA: Diagnosis not present

## 2022-06-18 NOTE — Telephone Encounter (Signed)
Please schedule CPE with fasting labs prior with Dr. Danise Mina.  Patient is already scheduled for [facility] on 08/13/22.  Please schedule CPE after that date.

## 2022-06-18 NOTE — Telephone Encounter (Signed)
LVM for patient to give [place] a callback to schedule.

## 2022-06-19 ENCOUNTER — Telehealth: Payer: Self-pay

## 2022-06-19 NOTE — Progress Notes (Signed)
    Chronic Care Management Pharmacy Assistant   Name: Donna Vargas  MRN: [id number] DOB: 11/25/1946  Reason for Encounter: CCM (Medication Adherence and Delivery Coordination)  Recent office visits:  None since last CCM contact  Recent consult visits:  None since last CCM contact  Hospital visits:  None in previous 6 months  Medications: Outpatient Encounter Medications as of 06/19/2022  Medication Sig   amLODipine (NORVASC) 10 MG tablet Take 1 tablet (10 mg total) by mouth daily.   atorvastatin (LIPITOR) 20 MG tablet Take 1 tablet (20 mg total) by mouth daily.   Cholecalciferol (VITAMIN D3) 25 MCG (1000 UT) CAPS Take 2 capsules (2,000 Units total) by mouth daily.   clonazePAM (KLONOPIN) 0.5 MG tablet TAKE ONE TABLET BY MOUTH EVERYDAY AT BEDTIME   cyanocobalamin (V-R VITAMIN B-12) 500 MCG tablet Take 1 tablet (500 mcg total) by mouth daily.   levocetirizine (XYZAL) 5 MG tablet Take 5 mg by mouth every evening. Replaced Allegra, per patient report   levothyroxine (SYNTHROID) 75 MCG tablet Take 1 tablet (75 mcg total) by mouth daily before breakfast.   omeprazole (PRILOSEC) 40 MG capsule Take 1 capsule (40 mg total) by mouth daily as needed. (Patient taking differently: Take 40 mg by mouth daily.)   oxybutynin (DITROPAN-XL) 5 MG 24 hr tablet Take 1 tablet (5 mg total) by mouth at bedtime.   sertraline (ZOLOFT) 100 MG tablet Take 1.5 tablets (150 mg total) by mouth daily.   No facility-administered encounter medications on file as of 06/19/2022.   BP Readings from Last 3 Encounters:  01/23/22 132/70  01/08/22 120/76  11/26/21 126/74    Lab Results  Component Value Date   HGBA1C 6.3 08/13/2021      No OVs, Consults, or hospital visits since last care coordination call / Pharmacist visit. No medication changes indicated  Patient is due for next adherence delivery on: 07/01/2022  Spoke with patient on 06/19/2022 reviewed medications and coordinated delivery.  This  delivery to include: Adherence Packaging  30 Days  Packs: Clonazepam 0.'5mg'$  - 1 tablet bedtime  Levocetirizine '5mg'$  - 1 tablet bedtime Vitamin D3 2000 IU - 1 tablet breakfast Vitamin B12 500 mcg - 1 tablet breakfast Sertraline 50 mg - take 1 tablet at breakfast Omeprazole 40 mg - Take 1 capsule at breakfast Sertraline 100 mg - take 1.5 tablet at breakfast Amlodipine 10 mg - 1 tablet breakfast  Levothyroxine 75 mcg - 1 tablet daily breakfast Atorvastatin 20 mg - 1 tablet daily at breakfast    Patient declined the following medications this month: None declined   Any concerns about your medications? No   How often do you forget or accidentally miss a dose? Rarely   Do you use a pillbox? No  Is patient in packaging Yes  If yes  What is the date on your next pill pack? 06/19/2022  Any concerns or issues with your packaging? No  Refills requested from providers include: Sertraline 50 mg - take 1 tablet at breakfast - complete  Confirmed delivery date of 07/01/2022, advised patient that pharmacy will contact them the morning of delivery.   Recent blood pressure readings are as follows: Patient states she does not check it at home.   Annual wellness visit in last year? Yes 08/20/2021 Most Recent BP reading: 120/76 on 01/08/2022   Charlene Brooke, CPP notified   [name], [place] Clinical Pharmacy Assistant [phone]  Cycle dispensing form sent to [name], PTM for review.

## 2022-06-24 ENCOUNTER — Other Ambulatory Visit: Payer: Self-pay | Admitting: Family Medicine

## 2022-06-24 NOTE — Telephone Encounter (Signed)
Requests for 2 separate rxs for 100 mg and 50 mg.  Message: **We do 1 tab of '100mg'$  and 1 tab of '50mg'$  of sertraline tabs so we do not have to split '100mg'$  tabs in packs = '150mg'$  dose daily**  Last rx: 100 mg 06/22/21, #135 Last OV:  01/23/22, R thigh pain; last CPE 08/20/21 Next OV:  none

## 2022-06-26 NOTE — Telephone Encounter (Signed)
ERx 

## 2022-07-20 ENCOUNTER — Other Ambulatory Visit: Payer: Self-pay | Admitting: Family Medicine

## 2022-07-20 NOTE — Telephone Encounter (Signed)
Name of Medication: Clonazepam Name of Pharmacy: [facility] Last Fill or Written Date and Quantity: 06/24/22, #30 Last Office Visit and Type: 01/23/22, R thigh pain Next Office Visit and Type: none Last Controlled Substance Agreement Date: 06/08/16 Last UDS: 06/08/16

## 2022-07-21 ENCOUNTER — Telehealth: Payer: Self-pay

## 2022-07-21 NOTE — Telephone Encounter (Signed)
ERx 

## 2022-07-21 NOTE — Progress Notes (Signed)
Chronic Care Management Pharmacy Assistant   Name: Donna Vargas  MRN: [id number] DOB: 1945-12-12  Reason for Encounter: CCM (Medication Adherence and Delivery Coordination)  Recent office visits:  None since last CCM contact  Recent consult visits:  None since last CCM contact  Hospital visits:  None in previous 6 months  Medications: Outpatient Encounter Medications as of 07/21/2022  Medication Sig   amLODipine (NORVASC) 10 MG tablet Take 1 tablet (10 mg total) by mouth daily.   atorvastatin (LIPITOR) 20 MG tablet Take 1 tablet (20 mg total) by mouth daily.   Cholecalciferol (VITAMIN D3) 25 MCG (1000 UT) CAPS Take 2 capsules (2,000 Units total) by mouth daily.   clonazePAM (KLONOPIN) 0.5 MG tablet TAKE ONE TABLET BY MOUTH EVERYDAY AT BEDTIME   cyanocobalamin (V-R VITAMIN B-12) 500 MCG tablet Take 1 tablet (500 mcg total) by mouth daily.   levocetirizine (XYZAL) 5 MG tablet Take 5 mg by mouth every evening. Replaced Allegra, per patient report   levothyroxine (SYNTHROID) 75 MCG tablet Take 1 tablet (75 mcg total) by mouth daily before breakfast.   omeprazole (PRILOSEC) 40 MG capsule Take 1 capsule (40 mg total) by mouth daily as needed. (Patient taking differently: Take 40 mg by mouth daily.)   oxybutynin (DITROPAN-XL) 5 MG 24 hr tablet Take 1 tablet (5 mg total) by mouth at bedtime.   sertraline (ZOLOFT) 100 MG tablet TAKE ONE TABLET BY MOUTH EVERY MORNING   sertraline (ZOLOFT) 50 MG tablet TAKE ONE TABLET BY MOUTH EVERY MORNING (total dose='150mg'$ )   No facility-administered encounter medications on file as of 07/21/2022.   BP Readings from Last 3 Encounters:  01/23/22 132/70  01/08/22 120/76  11/26/21 126/74    Lab Results  Component Value Date   HGBA1C 6.3 08/13/2021    No OVs, Consults, or hospital visits since last care coordination call / Pharmacist visit. No medication changes indicated  Last adherence delivery date:  07/01/22      Patient is due for  next adherence delivery on: 07/31/22  Spoke with patient on 07/21/2022 reviewed medications and coordinated delivery.   This delivery to include: Adherence Packaging  30 Days   Packs: Clonazepam 0.'5mg'$  - 1 tablet bedtime  Levocetirizine '5mg'$  - 1 tablet bedtime Vitamin D3 2000 IU - 1 tablet breakfast Vitamin B12 500 mcg - 1 tablet breakfast Sertraline 50 mg - take 1 tablet at breakfast Omeprazole 40 mg - Take 1 capsule at breakfast Sertraline 100 mg - take 1.5 tablet at breakfast Amlodipine 10 mg - 1 tablet breakfast  Levothyroxine 75 mcg - 1 tablet daily breakfast Atorvastatin 20 mg - 1 tablet daily at breakfast    Patient declined the following medications this month: None declined   Any concerns about your medications? No   How often do you forget or accidentally miss a dose? Rarely   Do you use a pillbox? No   Is patient in packaging Yes  If yes  What is the date on your next pill pack? 07/21/2022  Any concerns or issues with your packaging? No  Refills requested from providers include: Complete Clonazepam 0.'5mg'$  - 1 tablet bedtime   Confirmed delivery date of 07/31/2022, advised patient that pharmacy will contact them the morning of delivery.   Recent blood pressure readings are as follows: Patient states she does not check it at home.    Annual wellness visit in last year? Yes 08/20/2021 Most Recent BP reading: 120/76 on 01/08/2022   Charlene Brooke, CPP notified  [name], [place] Clinical Pharmacy Assistant [phone]  Cycle dispensing form sent to [name], PTM for review.

## 2022-08-13 ENCOUNTER — Ambulatory Visit: Payer: Medicare Other

## 2022-08-13 ENCOUNTER — Telehealth: Payer: Self-pay

## 2022-08-13 NOTE — Telephone Encounter (Signed)
Contacted patient on preferred number listed in notes for scheduled AWV. Patient stated unable to complete visit today and will call back to reschedule.

## 2022-08-18 ENCOUNTER — Telehealth: Payer: Self-pay | Admitting: Family Medicine

## 2022-08-18 DIAGNOSIS — E039 Hypothyroidism, unspecified: Secondary | ICD-10-CM

## 2022-08-18 NOTE — Telephone Encounter (Signed)
Called pt, no answer, left vm to call back

## 2022-08-18 NOTE — Telephone Encounter (Signed)
Noted.  Mailed a letter.

## 2022-08-18 NOTE — Telephone Encounter (Signed)
E-scribed refills.   Plz schedule CPE and lab visits for additional refills.  

## 2022-08-19 ENCOUNTER — Telehealth: Payer: Self-pay

## 2022-08-19 NOTE — Progress Notes (Signed)
Chronic Care Management Pharmacy Assistant   Name: Donna Vargas  MRN: [id number] DOB: 1945/12/14  Reason for Encounter: CCM (Medication Adherence and Delivery Coordination)  Recent office visits:  None since last CCM contact  Recent consult visits:  None since last CCM contact  Hospital visits:  None in previous 6 months  Medications: Outpatient Encounter Medications as of 08/19/2022  Medication Sig   amLODipine (NORVASC) 10 MG tablet Take 1 tablet (10 mg total) by mouth daily.   atorvastatin (LIPITOR) 20 MG tablet TAKE ONE TABLET BY MOUTH ONCE DAILY   Cholecalciferol (VITAMIN D3) 25 MCG (1000 UT) CAPS Take 2 capsules (2,000 Units total) by mouth daily.   clonazePAM (KLONOPIN) 0.5 MG tablet TAKE ONE TABLET BY MOUTH EVERYDAY AT BEDTIME   cyanocobalamin (V-R VITAMIN B-12) 500 MCG tablet Take 1 tablet (500 mcg total) by mouth daily.   levocetirizine (XYZAL) 5 MG tablet Take 5 mg by mouth every evening. Replaced Allegra, per patient report   levothyroxine (SYNTHROID) 75 MCG tablet TAKE ONE TABLET BY MOUTH EVERY MORNING   omeprazole (PRILOSEC) 40 MG capsule Take 1 capsule (40 mg total) by mouth daily as needed. (Patient taking differently: Take 40 mg by mouth daily.)   oxybutynin (DITROPAN-XL) 5 MG 24 hr tablet Take 1 tablet (5 mg total) by mouth at bedtime.   sertraline (ZOLOFT) 100 MG tablet TAKE ONE TABLET BY MOUTH EVERY MORNING   sertraline (ZOLOFT) 50 MG tablet TAKE ONE TABLET BY MOUTH EVERY MORNING (total dose='150mg'$ )   No facility-administered encounter medications on file as of 08/19/2022.   BP Readings from Last 3 Encounters:  01/23/22 132/70  01/08/22 120/76  11/26/21 126/74    Lab Results  Component Value Date   HGBA1C 6.3 08/13/2021    No OVs, Consults, or hospital visits since last care coordination call / Pharmacist visit. No medication changes indicated  Last adherence delivery date: 07/31/2022      Patient is due for next adherence delivery on:  08/31/2022  Spoke with patient on 08/19/2022 reviewed medications and coordinated delivery.  This delivery to include: Adherence Packaging  30 Days    Packs: Clonazepam 0.'5mg'$  - 1 tablet bedtime  Levocetirizine '5mg'$  - 1 tablet bedtime Vitamin D3 2000 IU - 1 tablet breakfast Vitamin B12 500 mcg - 1 tablet breakfast Sertraline 50 mg - take 1 tablet at breakfast Omeprazole 40 mg - Take 1 capsule at breakfast Sertraline 100 mg - take 1.5 tablet at breakfast Amlodipine 10 mg - 1 tablet breakfast  Levothyroxine 75 mcg - 1 tablet daily breakfast Atorvastatin 20 mg - 1 tablet daily at breakfast    Patient declined the following medications this month: None declined  Any concerns about your medications? No  How often do you forget or accidentally miss a dose? Never  Do you use a pillbox? No  Is patient in packaging Yes  If yes  What is the date on your next pill pack? 08/19/2022  Any concerns or issues with your packaging? No  Refills requested from providers include: Complete Levothyroxine 75 mcg - 1 tablet daily breakfast Atorvastatin 20 mg - 1 tablet daily at breakfast   Confirmed delivery date of 08/31/2022, advised patient that pharmacy will contact them the morning of delivery.   Recent blood pressure readings are as follows: Patient states she does not check it at home.    Annual wellness visit in last year? Yes 08/20/2021 Most Recent BP reading: 120/76 on 01/08/2022   Charlene Brooke, CPP notified  [name], [place] Clinical Pharmacy Assistant [phone] Cycle dispensing form sent to [name], PTM for review.

## 2022-08-26 ENCOUNTER — Other Ambulatory Visit: Payer: Self-pay | Admitting: Family Medicine

## 2022-08-27 DIAGNOSIS — L821 Other seborrheic keratosis: Secondary | ICD-10-CM | POA: Diagnosis not present

## 2022-08-27 DIAGNOSIS — L570 Actinic keratosis: Secondary | ICD-10-CM | POA: Diagnosis not present

## 2022-08-27 DIAGNOSIS — Z85828 Personal history of other malignant neoplasm of skin: Secondary | ICD-10-CM | POA: Diagnosis not present

## 2022-08-27 DIAGNOSIS — L814 Other melanin hyperpigmentation: Secondary | ICD-10-CM | POA: Diagnosis not present

## 2022-08-27 DIAGNOSIS — Z08 Encounter for follow-up examination after completed treatment for malignant neoplasm: Secondary | ICD-10-CM | POA: Diagnosis not present

## 2022-08-27 DIAGNOSIS — D225 Melanocytic nevi of trunk: Secondary | ICD-10-CM | POA: Diagnosis not present

## 2022-08-27 NOTE — Telephone Encounter (Signed)
E-scribed refill. Please schedule CPE and lab visits for additional refills.

## 2022-08-27 NOTE — Telephone Encounter (Signed)
Noted  

## 2022-08-27 NOTE — Telephone Encounter (Signed)
LVM for patient to call and schedule

## 2022-09-02 ENCOUNTER — Ambulatory Visit
Admission: EM | Admit: 2022-09-02 | Discharge: 2022-09-02 | Disposition: A | Discharge status: Home / Self Care | Payer: Medicare Other | Attending: Physician Assistant | Admitting: Physician Assistant

## 2022-09-02 ENCOUNTER — Ambulatory Visit (INDEPENDENT_AMBULATORY_CARE_PROVIDER_SITE_OTHER): Payer: Medicare Other

## 2022-09-02 DIAGNOSIS — M7989 Other specified soft tissue disorders: Secondary | ICD-10-CM | POA: Diagnosis not present

## 2022-09-02 DIAGNOSIS — S6702XA Crushing injury of left thumb, initial encounter: Secondary | ICD-10-CM

## 2022-09-02 DIAGNOSIS — M79645 Pain in left finger(s): Secondary | ICD-10-CM

## 2022-09-02 DIAGNOSIS — S6992XA Unspecified injury of left wrist, hand and finger(s), initial encounter: Secondary | ICD-10-CM | POA: Diagnosis not present

## 2022-09-02 DIAGNOSIS — M189 Osteoarthritis of first carpometacarpal joint, unspecified: Secondary | ICD-10-CM | POA: Diagnosis not present

## 2022-09-02 NOTE — ED Triage Notes (Signed)
Pt presents with left thumb injury after getting it caught between to pieces of metal; thumb is swollen, painful and discolored.

## 2022-09-02 NOTE — ED Provider Notes (Signed)
[facility]    CSN: [id number] Arrival date & time: 09/02/22  1022      History   Chief Complaint Chief Complaint  Patient presents with   Hand Injury    HPI Donna Vargas is a 76 y.o. female.   Here today for evaluation of left thumb injury that occurred after she got it caught between 2 pieces of metal.  She reports that her thumb is swollen and she has had some pain and bruising.  She has not had any numbness.  The history is provided by the patient.  Hand Injury Associated symptoms: no fever     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   History of asthma 1990s   with bronchitis   History of diverticulitis    ?IBS   Hypertension    Hypothyroidism 2015   ?by prior PCP   Osteopenia 10/19/2019   DEXA 10/2019 T -1.2 hip (osteopenia)   Perennial allergic rhinitis    allergy shots (sharma)   Prediabetes 06/2014   Right thyroid nodule 09/11/2014   S/p biopsy showing benign colloid nodule (08/2014)    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    Patient Active Problem List   Diagnosis Date Noted   Lumbar spondylosis 02/11/2022   Pain in right thigh 01/08/2022   Nasal congestion 01/08/2022   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   Vitamin D deficiency 08/19/2020   Low serum vitamin B12 06/12/2020   Chronic fatigue 06/12/2020   Tinnitus 11/21/2019   Osteopenia 10/19/2019   Carotid stenosis 08/09/2019   Lumbar herniated disc 07/19/2019   GERD (gastroesophageal reflux disease) 01/11/2019   Sensation of swollen throat 01/11/2019   Lumbar scoliosis 12/10/2018   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   Palpitations 09/06/2018   Left lateral epicondylitis 12/06/2017    Hypothyroidism 08/18/2017   Hearing loss due to cerumen impaction 07/09/2017   Abnormal drug screen 05/30/2016   HLD (hyperlipidemia) 10/17/2015   Medicare annual wellness visit, subsequent 09/11/2014   Advanced care planning/counseling discussion 09/11/2014   Right thyroid nodule 09/11/2014   Pain in lateral portion of right knee 08/31/2014   Surgical menopause 08/21/2014   Perennial allergic rhinitis    MDD (major depressive disorder), recurrent episode, moderate ([place])    Osteoarthritis    Hypertension    Prediabetes 06/30/2014    Past Surgical History:  Procedure Laterality Date   CATARACT EXTRACTION Left 03/2015   [place]   COLONOSCOPY  10/2008   WNL rpt 10 yrs Amedeo Plenty)   ESOPHAGOGASTRODUODENOSCOPY  10/2008   gastritis, CLO neg Amedeo Plenty)   OOPHORECTOMY  1990s   growth on ovary - benign   TOTAL ABDOMINAL HYSTERECTOMY  1995   both ovaries removed. CA125 level elevated - but no cancer    OB History   No obstetric history on file.      Home Medications    Prior to Admission medications   Medication Sig Start Date End Date Taking? Authorizing Provider  amLODipine (NORVASC) 10 MG tablet TAKE ONE TABLET BY MOUTH ONCE DAILY 08/27/22   [name], MD  atorvastatin (LIPITOR) 20 MG tablet TAKE ONE TABLET BY MOUTH ONCE DAILY 08/18/22  [name], MD  Cholecalciferol (VITAMIN D3) 25 MCG (1000 UT) CAPS Take 2 capsules (2,000 Units total) by mouth daily. 02/17/21   [name], MD  clonazePAM Bobbye [place]) 0.5 MG tablet TAKE ONE TABLET BY MOUTH EVERYDAY AT BEDTIME 07/21/22   [name], MD  cyanocobalamin (V-R VITAMIN B-12) 500 MCG tablet Take 1 tablet (500 mcg total) by mouth daily. 02/01/16   [name], MD  levocetirizine (XYZAL) 5 MG tablet Take 5 mg by mouth every evening. Replaced Allegra, per patient report    [provider]  levothyroxine (SYNTHROID) 75 MCG tablet TAKE ONE TABLET BY MOUTH EVERY MORNING 08/18/22   [name], MD   omeprazole (PRILOSEC) 40 MG capsule Take 1 capsule (40 mg total) by mouth daily as needed. Patient taking differently: Take 40 mg by mouth daily. 11/26/21   Tower, Wynelle Fanny, MD  oxybutynin (DITROPAN-XL) 5 MG 24 hr tablet Take 1 tablet (5 mg total) by mouth at bedtime. 08/20/21   [name], MD  sertraline (ZOLOFT) 100 MG tablet TAKE ONE TABLET BY MOUTH EVERY MORNING 06/26/22   [name], MD  sertraline (ZOLOFT) 50 MG tablet TAKE ONE TABLET BY MOUTH EVERY MORNING (total dose='150mg'$ ) 06/26/22   [name], MD    Family History Family History  Problem Relation Age of Onset   Cancer Mother        ovarian x2   Ovarian cancer Mother    CAD Father        unclear   Breast cancer Maternal Grandmother    CAD Maternal Grandfather    Breast cancer Cousin        maternal   Breast cancer Cousin        paternal   Hypertension Neg Hx    Diabetes Neg Hx     Social History Social History   Tobacco Use   Smoking status: Never   Smokeless tobacco: Never  Vaping Use   Vaping Use: Never used  Substance Use Topics   Alcohol use: No    Alcohol/week: 0.0 standard drinks of alcohol   Drug use: No     Allergies   Hctz [hydrochlorothiazide] and Sulfa antibiotics   Review of Systems Review of Systems  Constitutional:  Negative for chills and fever.  Eyes:  Negative for discharge and redness.  Gastrointestinal:  Negative for abdominal pain, nausea and vomiting.  Musculoskeletal:  Positive for arthralgias.  Skin:  Positive for color change. Negative for wound.  Neurological:  Negative for numbness.     Physical Exam Triage Vital Signs ED Triage Vitals  Enc Vitals Group     BP 09/02/22 1133 126/68     Pulse Rate 09/02/22 1133 70     Resp 09/02/22 1133 18     Temp 09/02/22 1133 98 F (36.7 C)     Temp Source 09/02/22 1133 Oral     SpO2 09/02/22 1133 95 %     Weight --      Height --      Head Circumference --      Peak Flow --      Pain Score 09/02/22 1132 7      Pain Loc --      Pain Edu? --      Excl. in [place]? --    No data found.  Updated Vital Signs BP 126/68 (BP Location: Left Arm)   Pulse 70   Temp 98 F (36.7 C) (Oral)   Resp 18   SpO2 95%   Physical  Exam Vitals and nursing note reviewed.  Constitutional:      General: She is not in acute distress.    Appearance: Normal appearance. She is not ill-appearing.  HENT:     Head: Normocephalic and atraumatic.  Eyes:     Conjunctiva/sclera: Conjunctivae normal.  Cardiovascular:     Rate and Rhythm: Normal rate.  Pulmonary:     Effort: Pulmonary effort is normal.  Musculoskeletal:     Comments: Swelling and bruising appreciated to left distal thumb, movement of both left 1st MCP and PIP cause pain, distal thumb TTP  Skin:    Capillary Refill: Normal cap refill to left thumb    Findings: Bruising present.  Neurological:     Mental Status: She is alert.     Comments: Gross sensation intact to distal left thumb.   Psychiatric:        Mood and Affect: Mood normal.        Behavior: Behavior normal.        Thought Content: Thought content normal.      UC Treatments / Results  Labs (all labs ordered are listed, but only abnormal results are displayed) Labs Reviewed - No data to display  EKG   Radiology DG Finger Thumb Left  Result Date: 09/02/2022 CLINICAL DATA:  Injury. Pain and swelling at the interphalangeal joint. EXAM: LEFT THUMB three views COMPARISON:  None Available. FINDINGS: Advanced degenerative changes are present. Large osteophytes are present. A well corticated bone fragment is present along the radial aspect of the joint. Soft tissue swelling is present. No acute osseous abnormality is present. Mild degenerative changes are present at the first [facility] joint. IMPRESSION: 1. Advanced degenerative changes of the interphalangeal joint. 2. Soft tissue swelling without acute osseous abnormality. Electronically Signed   By: San Morelle M.D.   On: 09/02/2022 11:45     Procedures Procedures (including critical care time)  Medications Ordered in UC Medications - No data to display  Initial Impression / Assessment and Plan / UC Course  I have reviewed the triage vital signs and the nursing notes.  Pertinent labs & imaging results that were available during my care of the patient were reviewed by me and considered in my medical decision making (see chart for details).    No fracture on Xray. Patient requests splint to help prevent hitting thumb while using left hand. Splint applied in office. Recommended ibuprofen as needed for pain. Encouraged follow up if no gradual improvement or with any further concerns.   Final Clinical Impressions(s) / UC Diagnoses   Final diagnoses:  Crushing injury of left thumb, initial encounter   Discharge Instructions   None    ED Prescriptions   None    PDMP not reviewed this encounter.   Francene Finders, PA-C 09/02/22 1216

## 2022-09-06 ENCOUNTER — Encounter: Payer: Self-pay | Admitting: Family Medicine

## 2022-09-06 DIAGNOSIS — F331 Major depressive disorder, recurrent, moderate: Secondary | ICD-10-CM

## 2022-09-06 DIAGNOSIS — Z6379 Other stressful life events affecting family and household: Secondary | ICD-10-CM

## 2022-09-15 ENCOUNTER — Other Ambulatory Visit: Payer: Self-pay | Admitting: Family Medicine

## 2022-09-15 NOTE — Telephone Encounter (Signed)
Name of Medication: Clonazepam Name of Pharmacy: [facility] Last Fill or Written Date and Quantity: 08/26/22. #30 Last Office Visit and Type: 01/23/22, R thigh pain Next Office Visit and Type: 11/25/22, CPE Last Controlled Substance Agreement Date: 06/08/16 Last UDS: 06/08/16

## 2022-09-16 NOTE — Telephone Encounter (Signed)
ERx 

## 2022-09-18 ENCOUNTER — Telehealth: Payer: Self-pay

## 2022-09-18 NOTE — Progress Notes (Signed)
Chronic Care Management Pharmacy Assistant   Name: Donna Vargas  MRN: [id number] DOB: 1946-09-24  Reason for Encounter: CCM (Medication Adherence and Delivery Coordination)  Recent office visits:  None since last CCM contact  Recent consult visits:  09/02/22 [facility] Crushing injury of left thumb. No fracture on Xray. Patient requests splint to help prevent hitting thumb while using left hand. Splint applied in office. Recommended ibuprofen as needed for pain. Encouraged follow up if no gradual improvement or with any further concerns.   Hospital visits:  None in previous 6 months  Medications: Outpatient Encounter Medications as of 09/18/2022  Medication Sig   amLODipine (NORVASC) 10 MG tablet TAKE ONE TABLET BY MOUTH ONCE DAILY   atorvastatin (LIPITOR) 20 MG tablet TAKE ONE TABLET BY MOUTH ONCE DAILY   Cholecalciferol (VITAMIN D3) 25 MCG (1000 UT) CAPS Take 2 capsules (2,000 Units total) by mouth daily.   clonazePAM (KLONOPIN) 0.5 MG tablet TAKE ONE TABLET BY MOUTH EVERYDAY AT BEDTIME   cyanocobalamin (V-R VITAMIN B-12) 500 MCG tablet Take 1 tablet (500 mcg total) by mouth daily.   levocetirizine (XYZAL) 5 MG tablet Take 5 mg by mouth every evening. Replaced Allegra, per patient report   levothyroxine (SYNTHROID) 75 MCG tablet TAKE ONE TABLET BY MOUTH EVERY MORNING   omeprazole (PRILOSEC) 40 MG capsule Take 1 capsule (40 mg total) by mouth daily as needed. (Patient taking differently: Take 40 mg by mouth daily.)   oxybutynin (DITROPAN-XL) 5 MG 24 hr tablet Take 1 tablet (5 mg total) by mouth at bedtime.   sertraline (ZOLOFT) 100 MG tablet TAKE ONE TABLET BY MOUTH EVERY MORNING   sertraline (ZOLOFT) 50 MG tablet TAKE ONE TABLET BY MOUTH EVERY MORNING (total dose='150mg'$ )   No facility-administered encounter medications on file as of 09/18/2022.   BP Readings from Last 3 Encounters:  09/02/22 126/68  01/23/22 132/70  01/08/22 120/76    Lab Results   Component Value Date   HGBA1C 6.3 08/13/2021    Recent OV, Consult or Hospital visit:  No medication changes indicated  Last adherence delivery date: 08/31/2022   Patient is due for next adherence delivery on: 09/30/2022  Spoke with patient on 09/22/2022 reviewed medications and coordinated delivery.  This delivery to include: Adherence Packaging  30 Days    Packs: Clonazepam 0.'5mg'$  - 1 tablet bedtime  Levocetirizine '5mg'$  - 1 tablet bedtime Vitamin D3 2000 IU - 1 tablet breakfast Vitamin B12 500 mcg - 1 tablet breakfast Sertraline 50 mg - take 1 tablet at breakfast Omeprazole 40 mg - Take 1 capsule at breakfast Sertraline 100 mg - take 1.5 tablet at breakfast Amlodipine 10 mg - 1 tablet breakfast  Levothyroxine 75 mcg - 1 tablet daily breakfast Atorvastatin 20 mg - 1 tablet daily at breakfast    Patient declined the following medications this month: None declined   Any concerns about your medications? No   How often do you forget or accidentally miss a dose? Never   Do you use a pillbox? No  Is patient in packaging Yes  If yes  What is the date on your next pill pack? 09/22/2022  Any concerns or issues with your packaging? No  No refill request needed.  Confirmed delivery date of 09/30/2022, advised patient that pharmacy will contact them the morning of delivery.  Recent blood pressure readings are as follows: Patient states she does not check it at home.   Annual wellness visit in last year? Yes 08/20/2021  Most Recent BP reading: 120/76 on 01/08/2022   Charlene Brooke, CPP notified   [name], [place] Clinical Pharmacy Assistant [phone] Cycle dispensing form sent to [name], PTM for review.

## 2022-10-06 ENCOUNTER — Ambulatory Visit (INDEPENDENT_AMBULATORY_CARE_PROVIDER_SITE_OTHER): Payer: Medicare Other | Admitting: Family Medicine

## 2022-10-06 ENCOUNTER — Encounter: Payer: Self-pay | Admitting: Family Medicine

## 2022-10-06 VITALS — BP 130/70 | HR 80 | Temp 97.4°F | Ht 62.25 in | Wt 155.0 lb

## 2022-10-06 DIAGNOSIS — J3089 Other allergic rhinitis: Secondary | ICD-10-CM | POA: Diagnosis not present

## 2022-10-06 DIAGNOSIS — R0982 Postnasal drip: Secondary | ICD-10-CM | POA: Diagnosis not present

## 2022-10-06 DIAGNOSIS — S161XXA Strain of muscle, fascia and tendon at neck level, initial encounter: Secondary | ICD-10-CM | POA: Diagnosis not present

## 2022-10-06 DIAGNOSIS — E041 Nontoxic single thyroid nodule: Secondary | ICD-10-CM

## 2022-10-06 NOTE — Assessment & Plan Note (Signed)
S/p benign biopsy 2015.  If ongoing/worsening obstructive symptoms, discussed proceeding with repeat thyroid US.

## 2022-10-06 NOTE — Patient Instructions (Signed)
Try flonase nasal steroid for post nasal drainage. Ok to continue xyzal and benadryl.  Let us know if swallowing no better to consider updated thyroid ultrasound.  For neck - continue robaxin muscle relaxant, ibuprofen and tylenol, gentle stretching exercises provided today, use ice/heating pad whichever soothes better.

## 2022-10-06 NOTE — Progress Notes (Signed)
Patient ID: Kevyn Wengert, female    DOB: 1946/03/04, 76 y.o.   MRN: [id number]  This visit was conducted in person.  BP 130/70   Pulse 80   Temp (!) 97.4 F (36.3 C) (Temporal)   Ht 5' 2.25" (1.581 m)   Wt 155 lb (70.3 kg)   SpO2 98%   BMI 28.12 kg/m    CC: left neck pain  Subjective:   HPI: Dustin Burrill is a 76 y.o. female presenting on 10/06/2022 for Neck Pain (C/o L side neck pain.  Started about 1 wk ago.  More painful when rotating head to L although feels pulling sensation on L side with R rotation. )   1 wk h/o "crick in neck" on left. Tuesday morning noted left sided neck pain, reproducible with lateral rotation to the left. Notes ongoing difficulty swallowing for several months. No hoarse voice.   No inciting trauma/injury.  No fevers/chills, no shooting pain down arms, numbness, paresthesias. No weakness of arms.   Treating pain with ibuprofen '600mg'$  and tylenol with limited benefit.   Chronic back pain - sees Dr Veverly Fells. On methocarbamol '500mg'$  QID as well as recent prednisone course for lower back pain.   H/o R thyroid nodule s/p reassuring biopsy 2015.   Ongoing PNdrainage worse at night time, despite xyzal during day and benadryl at night       Relevant past medical, surgical, family and social history reviewed and updated as indicated. Interim medical history since our last visit reviewed. Allergies and medications reviewed and updated. Outpatient Medications Prior to Visit  Medication Sig Dispense Refill   amLODipine (NORVASC) 10 MG tablet TAKE ONE TABLET BY MOUTH ONCE DAILY 90 tablet 0   atorvastatin (LIPITOR) 20 MG tablet TAKE ONE TABLET BY MOUTH ONCE DAILY 90 tablet 0   Cholecalciferol (VITAMIN D3) 25 MCG (1000 UT) CAPS Take 2 capsules (2,000 Units total) by mouth daily.     clonazePAM (KLONOPIN) 0.5 MG tablet TAKE ONE TABLET BY MOUTH EVERYDAY AT BEDTIME 30 tablet 1   cyanocobalamin (V-R VITAMIN B-12) 500 MCG tablet Take 1 tablet (500  mcg total) by mouth daily.     levocetirizine (XYZAL) 5 MG tablet Take 5 mg by mouth every evening. Replaced Allegra, per patient report     levothyroxine (SYNTHROID) 75 MCG tablet TAKE ONE TABLET BY MOUTH EVERY MORNING 90 tablet 0   methocarbamol (ROBAXIN) 500 MG tablet 500 mg 4 (four) times daily.     omeprazole (PRILOSEC) 40 MG capsule Take 1 capsule (40 mg total) by mouth daily as needed. (Patient taking differently: Take 40 mg by mouth daily.) 90 capsule 3   oxybutynin (DITROPAN-XL) 5 MG 24 hr tablet Take 1 tablet (5 mg total) by mouth at bedtime. 90 tablet 3   sertraline (ZOLOFT) 100 MG tablet TAKE ONE TABLET BY MOUTH EVERY MORNING 90 tablet 1   sertraline (ZOLOFT) 50 MG tablet TAKE ONE TABLET BY MOUTH EVERY MORNING (total dose='150mg'$ ) 90 tablet 1   No facility-administered medications prior to visit.     Per HPI unless specifically indicated in ROS section below Review of Systems  Objective:  BP 130/70   Pulse 80   Temp (!) 97.4 F (36.3 C) (Temporal)   Ht 5' 2.25" (1.581 m)   Wt 155 lb (70.3 kg)   SpO2 98%   BMI 28.12 kg/m   Wt Readings from Last 3 Encounters:  10/06/22 155 lb (70.3 kg)  01/23/22 154 lb 6 oz (70 kg)  01/08/22 154 lb (69.9 kg)      Physical Exam Vitals and nursing note reviewed.  Constitutional:      Appearance: Normal appearance. She is not ill-appearing.  HENT:     Head: Normocephalic and atraumatic.     Right Ear: Tympanic membrane, ear canal and external ear normal.     Left Ear: Tympanic membrane, ear canal and external ear normal.     Ears:     Comments: Mild cerumen bilaterally    Nose: Nose normal. No congestion or rhinorrhea.     Mouth/Throat:     Mouth: Mucous membranes are moist.     Pharynx: Oropharynx is clear. No oropharyngeal exudate or posterior oropharyngeal erythema.  Eyes:     Extraocular Movements: Extraocular movements intact.     Conjunctiva/sclera: Conjunctivae normal.     Pupils: Pupils are equal, round, and reactive to  light.  Neck:     Thyroid: Thyroid mass (presumed R sided small nodule) present. No thyromegaly or thyroid tenderness.     Comments:  No midline cervical spine tenderness + L paraspinous mm discomfort to palpation Limited ROM to left lateral flexion and rotation due to discomfort.  Musculoskeletal:     Cervical back: Normal range of motion. No rigidity.     Right lower leg: No edema.     Left lower leg: No edema.  Lymphadenopathy:     Cervical: No cervical adenopathy.  Skin:    General: Skin is warm and dry.     Findings: No rash.  Neurological:     Mental Status: She is alert.  Psychiatric:        Mood and Affect: Mood normal.        Behavior: Behavior normal.       Results for orders placed or performed in visit on 01/23/22  CK  Result Value Ref Range   Total CK 48 29 - 143 U/L    Assessment & Plan:   Problem List Items Addressed This Visit     Perennial allergic rhinitis   Right thyroid nodule    S/p benign biopsy 2015.  If ongoing/worsening obstructive symptoms, discussed proceeding with repeat thyroid US.       Neck strain, initial encounter - Primary    Story/exam most consistent with neck strain or initial "crick in neck".  Supportive measures reviewed - continue ibuprofen, tylenol, start ice/heating pad, gentle neck stretching exercises. May use methocarbamol previously prescribed by ortho. Also provided with exercises from [facility] pt advisor on neck strain.        Post-nasal drainage    Discussed treatment options - continue xyzal, benadryl, add flonase nasal steroid. Update with effect.         No orders of the defined types were placed in this encounter.  No orders of the defined types were placed in this encounter.    Patient Instructions  Try flonase nasal steroid for post nasal drainage. Ok to continue xyzal and benadryl.  Let us know if swallowing no better to consider updated thyroid ultrasound.  For neck - continue robaxin muscle relaxant,  ibuprofen and tylenol, gentle stretching exercises provided today, use ice/heating pad whichever soothes better.   Follow up plan: Return if symptoms worsen or fail to improve.  [name], MD

## 2022-10-06 NOTE — Assessment & Plan Note (Addendum)
Discussed treatment options - continue xyzal, benadryl, add flonase nasal steroid. Update with effect.

## 2022-10-06 NOTE — Assessment & Plan Note (Signed)
Story/exam most consistent with neck strain or initial "crick in neck".  Supportive measures reviewed - continue ibuprofen, tylenol, start ice/heating pad, gentle neck stretching exercises. May use methocarbamol previously prescribed by ortho. Also provided with exercises from [facility] pt advisor on neck strain.

## 2022-10-16 ENCOUNTER — Telehealth: Payer: Self-pay

## 2022-10-16 NOTE — Progress Notes (Unsigned)
    Chronic Care Management Pharmacy Assistant   Name: Donna Vargas  MRN: [id number] DOB: [birth year]  Reason for Encounter: CCM (Medication Adherence and Delivery Coordination)  Recent office visits:  10/06/22 [name], MD Neck pain Start: Flonase for nasal drainage  Recent consult visits:  None since last CCM contact  Hospital visits:  None since last CCM contact  Medications: Outpatient Encounter Medications as of 10/16/2022  Medication Sig   amLODipine (NORVASC) 10 MG tablet TAKE ONE TABLET BY MOUTH ONCE DAILY   atorvastatin (LIPITOR) 20 MG tablet TAKE ONE TABLET BY MOUTH ONCE DAILY   Cholecalciferol (VITAMIN D3) 25 MCG (1000 UT) CAPS Take 2 capsules (2,000 Units total) by mouth daily.   clonazePAM (KLONOPIN) 0.5 MG tablet TAKE ONE TABLET BY MOUTH EVERYDAY AT BEDTIME   cyanocobalamin (V-R VITAMIN B-12) 500 MCG tablet Take 1 tablet (500 mcg total) by mouth daily.   levocetirizine (XYZAL) 5 MG tablet Take 5 mg by mouth every evening. Replaced Allegra, per patient report   levothyroxine (SYNTHROID) 75 MCG tablet TAKE ONE TABLET BY MOUTH EVERY MORNING   methocarbamol (ROBAXIN) 500 MG tablet 500 mg 4 (four) times daily.   omeprazole (PRILOSEC) 40 MG capsule Take 1 capsule (40 mg total) by mouth daily as needed. (Patient taking differently: Take 40 mg by mouth daily.)   oxybutynin (DITROPAN-XL) 5 MG 24 hr tablet Take 1 tablet (5 mg total) by mouth at bedtime.   sertraline (ZOLOFT) 100 MG tablet TAKE ONE TABLET BY MOUTH EVERY MORNING   sertraline (ZOLOFT) 50 MG tablet TAKE ONE TABLET BY MOUTH EVERY MORNING (total dose='150mg'$ )   No facility-administered encounter medications on file as of 10/16/2022.   BP Readings from Last 3 Encounters:  10/06/22 130/70  09/02/22 126/68  01/23/22 132/70    Lab Results  Component Value Date   HGBA1C 6.3 08/13/2021    Recent OV, Consult or Hospital visit:  Recent medication changes indicated:   Start: Flonase for nasal  drainage  Last adherence delivery date: 09/30/2022      Patient is due for next adherence delivery on: 10/29/2022  {Med Review:25223}  This delivery to include: Adherence Packaging  30 Days    Packs: Clonazepam 0.'5mg'$  - 1 tablet bedtime  Levocetirizine '5mg'$  - 1 tablet bedtime Vitamin D3 2000 IU - 1 tablet breakfast Vitamin B12 500 mcg - 1 tablet breakfast Sertraline 50 mg - take 1 tablet at breakfast Omeprazole 40 mg - Take 1 capsule at breakfast Sertraline 100 mg - take 1.5 tablet at breakfast Amlodipine 10 mg - 1 tablet breakfast  Levothyroxine 75 mcg - 1 tablet daily breakfast Atorvastatin 20 mg - 1 tablet daily at breakfast    Patient declined the following medications this month: None declined   Any concerns about your medications? No   How often do you forget or accidentally miss a dose? Never   Do you use a pillbox? No  Is patient in packaging {yes/no:20286}  If yes  What is the date on your next pill pack?  Any concerns or issues with your packaging?  No refill request needed.  {Delivery JOAC:16606} 10/29/2022  Recent blood pressure readings are as follows: Patient states she does not check it at home.    Annual wellness visit in last year? Yes 08/20/2021 Most Recent BP reading: 130/70 on 10/06/2022   Charlene Brooke, CPP notified   [name], [place] Assistant [phone]  Cycle dispensing form sent to {Medreview:27653} for review.

## 2022-11-11 DIAGNOSIS — M48061 Spinal stenosis, lumbar region without neurogenic claudication: Secondary | ICD-10-CM | POA: Diagnosis not present

## 2022-11-11 DIAGNOSIS — M7062 Trochanteric bursitis, left hip: Secondary | ICD-10-CM | POA: Diagnosis not present

## 2022-11-11 DIAGNOSIS — M7061 Trochanteric bursitis, right hip: Secondary | ICD-10-CM | POA: Diagnosis not present

## 2022-11-16 ENCOUNTER — Other Ambulatory Visit: Payer: Self-pay | Admitting: Family Medicine

## 2022-11-16 DIAGNOSIS — E039 Hypothyroidism, unspecified: Secondary | ICD-10-CM

## 2022-11-16 DIAGNOSIS — E559 Vitamin D deficiency, unspecified: Secondary | ICD-10-CM

## 2022-11-16 DIAGNOSIS — M85859 Other specified disorders of bone density and structure, unspecified thigh: Secondary | ICD-10-CM

## 2022-11-16 DIAGNOSIS — R7303 Prediabetes: Secondary | ICD-10-CM

## 2022-11-16 DIAGNOSIS — I10 Essential (primary) hypertension: Secondary | ICD-10-CM

## 2022-11-16 DIAGNOSIS — E538 Deficiency of other specified B group vitamins: Secondary | ICD-10-CM

## 2022-11-16 DIAGNOSIS — E785 Hyperlipidemia, unspecified: Secondary | ICD-10-CM

## 2022-11-17 ENCOUNTER — Telehealth: Payer: Self-pay

## 2022-11-17 DIAGNOSIS — M5416 Radiculopathy, lumbar region: Secondary | ICD-10-CM | POA: Diagnosis not present

## 2022-11-17 NOTE — Telephone Encounter (Signed)
Name of Medication: Clonazepam Name of Pharmacy: [facility] Last Fill or Written Date and Quantity: 10/27/22, #30 Last Office Visit and Type: 10/06/22, neck pain Next Office Visit and Type: 11/25/22, CPE Last Controlled Substance Agreement Date: 06/08/16 Last UDS: 06/08/16

## 2022-11-17 NOTE — Progress Notes (Unsigned)
    Chronic Care Management Pharmacy Assistant   Name: Donna Vargas  MRN: [id number] DOB: 11-13-1946  Reason for Encounter: CCM (Medication Adherence and Delivery Coordination)  Medications: Outpatient Encounter Medications as of 11/17/2022  Medication Sig   amLODipine (NORVASC) 10 MG tablet TAKE ONE TABLET BY MOUTH ONCE DAILY   atorvastatin (LIPITOR) 20 MG tablet TAKE ONE TABLET BY MOUTH ONCE DAILY   Cholecalciferol (VITAMIN D3) 25 MCG (1000 UT) CAPS Take 2 capsules (2,000 Units total) by mouth daily.   clonazePAM (KLONOPIN) 0.5 MG tablet TAKE ONE TABLET BY MOUTH EVERYDAY AT BEDTIME   cyanocobalamin (V-R VITAMIN B-12) 500 MCG tablet Take 1 tablet (500 mcg total) by mouth daily.   levocetirizine (XYZAL) 5 MG tablet Take 5 mg by mouth every evening. Replaced Allegra, per patient report   levothyroxine (SYNTHROID) 75 MCG tablet TAKE ONE TABLET BY MOUTH EVERY MORNING   methocarbamol (ROBAXIN) 500 MG tablet 500 mg 4 (four) times daily.   omeprazole (PRILOSEC) 40 MG capsule Take 1 capsule (40 mg total) by mouth daily as needed. (Patient taking differently: Take 40 mg by mouth daily.)   oxybutynin (DITROPAN-XL) 5 MG 24 hr tablet Take 1 tablet (5 mg total) by mouth at bedtime.   sertraline (ZOLOFT) 100 MG tablet TAKE ONE TABLET BY MOUTH EVERY MORNING   sertraline (ZOLOFT) 50 MG tablet TAKE ONE TABLET BY MOUTH EVERY MORNING (total dose='150mg'$ )   No facility-administered encounter medications on file as of 11/17/2022.    BP Readings from Last 3 Encounters:  10/06/22 130/70  09/02/22 126/68  01/23/22 132/70    Pulse Readings from Last 3 Encounters:  10/06/22 80  09/02/22 70  01/23/22 77    Lab Results  Component Value Date/Time   HGBA1C 6.3 08/13/2021 09:35 AM   HGBA1C 6.2 02/10/2021 09:26 AM   Lab Results  Component Value Date   CREATININE 0.76 08/13/2021   BUN 17 08/13/2021   GFR 78.03 08/13/2021   NA 142 08/13/2021   K 3.9 08/13/2021   CALCIUM 9.1 08/13/2021   CO2  28 08/13/2021     Last adherence delivery date: 10/29/2022      Patient is due for next adherence delivery on: 11/30/2022  {Med Review:25223}  Packs: Clonazepam 0.'5mg'$  - 1 tablet bedtime  Levocetirizine '5mg'$  - 1 tablet bedtime Vitamin D3 2000 IU - 1 tablet breakfast Vitamin B12 500 mcg - 1 tablet breakfast Sertraline 50 mg - take 1 tablet at breakfast Omeprazole 40 mg - Take 1 capsule at breakfast Sertraline 100 mg - take 1.5 tablet at breakfast Amlodipine 10 mg - 1 tablet breakfast  Levothyroxine 75 mcg - 1 tablet daily breakfast Atorvastatin 20 mg - 1 tablet daily at breakfast   Any concerns about your medications? No  How often do you forget or accidentally miss a dose? {Missed doses:25554}  Do you use a pillbox? No  Is patient in packaging {yes/no:20286}  If yes  What is the date on your next pill pack?  Any concerns or issues with your packaging?  No refill request needed.  {Delivery YFVC:94496} 11/30/2022  Recent blood pressure readings are as follows: Patient states she does not check it at home.    Annual wellness visit in last year? Yes 08/20/2021 Most Recent BP reading: 130/70 on 10/06/2022   Cycle dispensing form sent to {Medreview:27653} for review.  Next CCM appointment: 01/20/2023  [name], [place] Clinical Pharmacy Assistant [phone]

## 2022-11-18 ENCOUNTER — Ambulatory Visit: Payer: Medicare Other

## 2022-11-18 ENCOUNTER — Other Ambulatory Visit (INDEPENDENT_AMBULATORY_CARE_PROVIDER_SITE_OTHER): Payer: Medicare Other

## 2022-11-18 DIAGNOSIS — E785 Hyperlipidemia, unspecified: Secondary | ICD-10-CM

## 2022-11-18 DIAGNOSIS — M85859 Other specified disorders of bone density and structure, unspecified thigh: Secondary | ICD-10-CM | POA: Diagnosis not present

## 2022-11-18 DIAGNOSIS — R7303 Prediabetes: Secondary | ICD-10-CM

## 2022-11-18 DIAGNOSIS — I10 Essential (primary) hypertension: Secondary | ICD-10-CM

## 2022-11-18 DIAGNOSIS — E039 Hypothyroidism, unspecified: Secondary | ICD-10-CM

## 2022-11-18 DIAGNOSIS — E559 Vitamin D deficiency, unspecified: Secondary | ICD-10-CM

## 2022-11-18 DIAGNOSIS — E538 Deficiency of other specified B group vitamins: Secondary | ICD-10-CM | POA: Diagnosis not present

## 2022-11-18 LAB — COMPREHENSIVE METABOLIC PANEL
ALT: 11 U/L (ref 0–35)
AST: 12 U/L (ref 0–37)
Albumin: 4.2 g/dL (ref 3.5–5.2)
Alkaline Phosphatase: 76 U/L (ref 39–117)
BUN: 20 mg/dL (ref 6–23)
CO2: 28 mEq/L (ref 19–32)
Calcium: 9.4 mg/dL (ref 8.4–10.5)
Chloride: 106 mEq/L (ref 96–112)
Creatinine, Ser: 0.87 mg/dL (ref 0.40–1.20)
GFR: 65.76 mL/min (ref >60.00)
Glucose, Bld: 92 mg/dL (ref 70–99)
Potassium: 4 mEq/L (ref 3.5–5.1)
Sodium: 141 mEq/L (ref 135–145)
Total Bilirubin: 0.4 mg/dL (ref 0.2–1.2)
Total Protein: 7.1 g/dL (ref 6.0–8.3)

## 2022-11-18 LAB — MICROALBUMIN / CREATININE URINE RATIO
Creatinine,U: 85.6 mg/dL
Microalb Creat Ratio: 0.9 mg/g (ref 0.0–30.0)
Microalb, Ur: 0.7 mg/dL (ref 0.0–1.9)

## 2022-11-18 LAB — VITAMIN D 25 HYDROXY (VIT D DEFICIENCY, FRACTURES): VITD: 32.37 ng/mL (ref 30.00–100.00)

## 2022-11-18 LAB — LIPID PANEL
Cholesterol: 185 mg/dL (ref 0–200)
HDL: 60.1 mg/dL (ref >39.00)
LDL Cholesterol: 112 mg/dL — ABNORMAL HIGH (ref 0–99)
NonHDL: 124.48
Total CHOL/HDL Ratio: 3
Triglycerides: 62 mg/dL (ref 0.0–149.0)
VLDL: 12.4 mg/dL (ref 0.0–40.0)

## 2022-11-18 LAB — CBC WITH DIFFERENTIAL/PLATELET
Basophils Absolute: 0 10*3/uL (ref 0.0–0.1)
Basophils Relative: 0.4 % (ref 0.0–3.0)
Eosinophils Absolute: 0 10*3/uL (ref 0.0–0.7)
Eosinophils Relative: 0 % (ref 0.0–5.0)
HCT: 39 % (ref 36.0–46.0)
Hemoglobin: 13.2 g/dL (ref 12.0–15.0)
Lymphocytes Relative: 13.3 % (ref 12.0–46.0)
Lymphs Abs: 1.4 10*3/uL (ref 0.7–4.0)
MCHC: 33.8 g/dL (ref 30.0–36.0)
MCV: 90.6 fl (ref 78.0–100.0)
Monocytes Absolute: 0.8 10*3/uL (ref 0.1–1.0)
Monocytes Relative: 7.8 % (ref 3.0–12.0)
Neutro Abs: 8.5 10*3/uL — ABNORMAL HIGH (ref 1.4–7.7)
Neutrophils Relative %: 78.5 % — ABNORMAL HIGH (ref 43.0–77.0)
Platelets: 237 10*3/uL (ref 150.0–400.0)
RBC: 4.3 Mil/uL (ref 3.87–5.11)
RDW: 14.3 % (ref 11.5–15.5)
WBC: 10.8 10*3/uL — ABNORMAL HIGH (ref 4.0–10.5)

## 2022-11-18 LAB — HEMOGLOBIN A1C: Hgb A1c MFr Bld: 6.2 % (ref 4.6–6.5)

## 2022-11-18 LAB — TSH: TSH: 1.09 u[IU]/mL (ref 0.35–5.50)

## 2022-11-18 LAB — VITAMIN B12: Vitamin B-12: 781 pg/mL (ref 211–911)

## 2022-11-18 NOTE — Progress Notes (Unsigned)
Attempted to contact patient 3x for AWV. Left voicemail to call back to reschedule.

## 2022-11-18 NOTE — Telephone Encounter (Signed)
ERx 

## 2022-11-25 ENCOUNTER — Ambulatory Visit (INDEPENDENT_AMBULATORY_CARE_PROVIDER_SITE_OTHER): Payer: Medicare Other | Admitting: Family Medicine

## 2022-11-25 ENCOUNTER — Encounter: Payer: Self-pay | Admitting: Family Medicine

## 2022-11-25 VITALS — BP 138/70 | HR 72 | Temp 97.3°F | Ht 61.5 in | Wt 151.2 lb

## 2022-11-25 DIAGNOSIS — E538 Deficiency of other specified B group vitamins: Secondary | ICD-10-CM

## 2022-11-25 DIAGNOSIS — E039 Hypothyroidism, unspecified: Secondary | ICD-10-CM | POA: Diagnosis not present

## 2022-11-25 DIAGNOSIS — E785 Hyperlipidemia, unspecified: Secondary | ICD-10-CM

## 2022-11-25 DIAGNOSIS — E559 Vitamin D deficiency, unspecified: Secondary | ICD-10-CM

## 2022-11-25 DIAGNOSIS — R7303 Prediabetes: Secondary | ICD-10-CM | POA: Diagnosis not present

## 2022-11-25 DIAGNOSIS — I6523 Occlusion and stenosis of bilateral carotid arteries: Secondary | ICD-10-CM | POA: Diagnosis not present

## 2022-11-25 DIAGNOSIS — M85859 Other specified disorders of bone density and structure, unspecified thigh: Secondary | ICD-10-CM | POA: Diagnosis not present

## 2022-11-25 DIAGNOSIS — K219 Gastro-esophageal reflux disease without esophagitis: Secondary | ICD-10-CM | POA: Diagnosis not present

## 2022-11-25 DIAGNOSIS — F331 Major depressive disorder, recurrent, moderate: Secondary | ICD-10-CM

## 2022-11-25 DIAGNOSIS — M5441 Lumbago with sciatica, right side: Secondary | ICD-10-CM

## 2022-11-25 DIAGNOSIS — E041 Nontoxic single thyroid nodule: Secondary | ICD-10-CM

## 2022-11-25 DIAGNOSIS — Z7189 Other specified counseling: Secondary | ICD-10-CM | POA: Diagnosis not present

## 2022-11-25 DIAGNOSIS — M5442 Lumbago with sciatica, left side: Secondary | ICD-10-CM | POA: Diagnosis not present

## 2022-11-25 DIAGNOSIS — N3946 Mixed incontinence: Secondary | ICD-10-CM

## 2022-11-25 DIAGNOSIS — G8929 Other chronic pain: Secondary | ICD-10-CM

## 2022-11-25 DIAGNOSIS — Z0000 Encounter for general adult medical examination without abnormal findings: Secondary | ICD-10-CM

## 2022-11-25 DIAGNOSIS — I10 Essential (primary) hypertension: Secondary | ICD-10-CM | POA: Diagnosis not present

## 2022-11-25 MED ORDER — GABAPENTIN 100 MG PO CAPS: 100.0000 mg | ORAL_CAPSULE | Freq: Two times a day (BID) | ORAL | 3 refills | Status: DC | PRN

## 2022-11-25 MED ORDER — CYANOCOBALAMIN 500 MCG PO TABS: 500.0000 ug | ORAL_TABLET | ORAL | Status: DC

## 2022-11-25 NOTE — Assessment & Plan Note (Signed)
Continue vitamin D replacement.  
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
## 2022-11-25 NOTE — Assessment & Plan Note (Signed)
R thyroid nodule again palpated, s/p benign biopsy 2015. Discussed rpt thyroid US if any obstructive symptoms develop.

## 2022-11-25 NOTE — Assessment & Plan Note (Signed)

## 2022-11-25 NOTE — Assessment & Plan Note (Signed)
Overall stable period on sertraline '150mg'$  daily and klonopin nightly.

## 2022-11-25 NOTE — Assessment & Plan Note (Signed)
Chronic, stable on atorvastatin daily. Also encouraged diet changes to improve cholesterol control. The 10-year ASCVD risk score (Arnett DK, et al., 2019) is: 21.5%   Values used to calculate the score:     Age: 76 years     Sex: Female     Is Non-Hispanic African American: No     Diabetic: No     Tobacco smoker: No     Systolic Blood Pressure: 886 mmHg     Is BP treated: Yes     HDL Cholesterol: 60.1 mg/dL     Total Cholesterol: 185 mg/dL

## 2022-11-25 NOTE — Assessment & Plan Note (Signed)
Encouraged limiting added sugar in diet.  

## 2022-11-25 NOTE — Assessment & Plan Note (Signed)
Chronic, stable period on levothyroxine 27mg daily - continue

## 2022-11-25 NOTE — Patient Instructions (Addendum)
We will request latest colonoscopy from Dr Watt Climes at [place].  Bring [place] copy of your advanced directive to update your chart.  Increase fiber and legumes in diet to help cholesterol levels, continue atorvastatin.  May try gabapentin '100mg'$  1-2 a day as needed for sciatica pain. Let me know how you do with this.  Return as needed or in 1 year for next wellness visit.   Health Maintenance After Age 74 After age 22, you are at a higher risk for certain long-term diseases and infections as well as injuries from falls. Falls are a major cause of broken bones and head injuries in people who are older than age 74. Getting regular preventive care can help to keep you healthy and well. Preventive care includes getting regular testing and making lifestyle changes as recommended by your health care provider. Talk with your health care provider about: Which screenings and tests you should have. A screening is a test that checks for a disease when you have no symptoms. A diet and exercise plan that is right for you. What should I know about screenings and tests to prevent falls? Screening and testing are the best ways to find a health problem early. Early diagnosis and treatment give you the best chance of managing medical conditions that are common after age 31. Certain conditions and lifestyle choices may make you more likely to have a fall. Your health care provider may recommend: Regular vision checks. Poor vision and conditions such as cataracts can make you more likely to have a fall. If you wear glasses, make sure to get your prescription updated if your vision changes. Medicine review. Work with your health care provider to regularly review all of the medicines you are taking, including over-the-counter medicines. Ask your health care provider about any side effects that may make you more likely to have a fall. Tell your health care provider if any medicines that you take make you feel dizzy or sleepy. Strength  and balance checks. Your health care provider may recommend certain tests to check your strength and balance while standing, walking, or changing positions. Foot health exam. Foot pain and numbness, as well as not wearing proper footwear, can make you more likely to have a fall. Screenings, including: Osteoporosis screening. Osteoporosis is a condition that causes the bones to get weaker and break more easily. Blood pressure screening. Blood pressure changes and medicines to control blood pressure can make you feel dizzy. Depression screening. You may be more likely to have a fall if you have a fear of falling, feel depressed, or feel unable to do activities that you used to do. Alcohol use screening. Using too much alcohol can affect your balance and may make you more likely to have a fall. Follow these instructions at home: Lifestyle Do not drink alcohol if: Your health care provider tells you not to drink. If you drink alcohol: Limit how much you have to: 0-1 drink a day for women. 0-2 drinks a day for men. Know how much alcohol is in your drink. In the [place] one drink equals one 12 oz bottle of beer (355 mL), one 5 oz glass of wine (148 mL), or one 1 oz glass of hard liquor (44 mL). Do not use any products that contain nicotine or tobacco. These products include cigarettes, chewing tobacco, and vaping devices, such as e-cigarettes. If you need help quitting, ask your health care provider. Activity  Follow a regular exercise program to stay fit. This will help  you maintain your balance. Ask your health care provider what types of exercise are appropriate for you. If you need a cane or walker, use it as recommended by your health care provider. Wear supportive shoes that have nonskid soles. Safety  Remove any tripping hazards, such as rugs, cords, and clutter. Install safety equipment such as grab bars in bathrooms and safety rails on stairs. Keep rooms and walkways well-lit. General  instructions Talk with your health care provider about your risks for falling. Tell your health care provider if: You fall. Be sure to tell your health care provider about all falls, even ones that seem minor. You feel dizzy, tiredness (fatigue), or off-balance. Take over-the-counter and prescription medicines only as told by your health care provider. These include supplements. Eat a healthy diet and maintain a healthy weight. A healthy diet includes low-fat dairy products, low-fat (lean) meats, and fiber from whole grains, beans, and lots of fruits and vegetables. Stay current with your vaccines. Schedule regular health, dental, and eye exams. Summary Having a healthy lifestyle and getting preventive care can help to protect your health and wellness after age 28. Screening and testing are the best way to find a health problem early and help you avoid having a fall. Early diagnosis and treatment give you the best chance for managing medical conditions that are more common for people who are older than age 28. Falls are a major cause of broken bones and head injuries in people who are older than age 22. Take precautions to prevent a fall at home. Work with your health care provider to learn what changes you can make to improve your health and wellness and to prevent falls. This information is not intended to replace advice given to you by your health care provider. Make sure you discuss any questions you have with your health care provider. Document Revised: 04/07/2021 Document Reviewed: 04/07/2021 Elsevier Patient Education  [place].

## 2022-11-25 NOTE — Assessment & Plan Note (Signed)
Minimal on carotid US 2020.

## 2022-11-25 NOTE — Assessment & Plan Note (Signed)
Advanced directive: living will scanned 01/2015. Working on [employer] form. Would want husband to be HCPOA. Has filled out - will bring copy.

## 2022-11-25 NOTE — Assessment & Plan Note (Signed)
Continue oxybutynin XL '5mg'$  daily.

## 2022-11-25 NOTE — Assessment & Plan Note (Signed)
Chronic, stable on amlodipine - continue.

## 2022-11-25 NOTE — Assessment & Plan Note (Addendum)
Now seeing ortho. Recently received ESI by Dr Nelva Bush without much benefit, planning to return next month. Requests trial gabapentin which was recently effective when tried.  Start '100mg'$  daily to BID, reviewed possible side effects/adverse effects.

## 2022-11-25 NOTE — Assessment & Plan Note (Signed)
Stable period on 526mg B12 daily - will drop dose to MWF given noted increased restlessness/anxiety.

## 2022-11-25 NOTE — Assessment & Plan Note (Signed)
Continue cal/vit D replacement

## 2022-11-25 NOTE — Assessment & Plan Note (Signed)
Stable period on omeprazole '40mg'$  daily.

## 2022-11-27 ENCOUNTER — Encounter: Payer: Self-pay | Admitting: Family Medicine

## 2022-12-01 DIAGNOSIS — M48061 Spinal stenosis, lumbar region without neurogenic claudication: Secondary | ICD-10-CM | POA: Diagnosis not present

## 2022-12-01 DIAGNOSIS — M7062 Trochanteric bursitis, left hip: Secondary | ICD-10-CM | POA: Diagnosis not present

## 2022-12-01 DIAGNOSIS — M5416 Radiculopathy, lumbar region: Secondary | ICD-10-CM | POA: Diagnosis not present

## 2022-12-16 ENCOUNTER — Other Ambulatory Visit: Payer: Self-pay | Admitting: Family Medicine

## 2022-12-16 DIAGNOSIS — Z1231 Encounter for screening mammogram for malignant neoplasm of breast: Secondary | ICD-10-CM

## 2022-12-17 ENCOUNTER — Telehealth: Payer: Self-pay

## 2022-12-17 NOTE — Progress Notes (Signed)
Care Management & Coordination Services Pharmacy Team  Reason for Encounter: Medication coordination and delivery  Contacted patient on 12/17/2022 to discuss medications   Recent office visits:  11/25/22 [name], MD AWV Start: Gabapentin 100 mg Change: Cyanocobalamin 500 mcg Stop (completed): Methocarbamol 500 mg F/U 1 year  Recent consult visits:  12/01/22 Benedetto Goad, NP (Ortho) Lumbar Radiculopathy   Hospital visits:  None since last CCM contact  Medications: Outpatient Encounter Medications as of 12/17/2022  Medication Sig   amLODipine (NORVASC) 10 MG tablet TAKE ONE TABLET BY MOUTH ONCE DAILY   atorvastatin (LIPITOR) 20 MG tablet TAKE ONE TABLET BY MOUTH ONCE DAILY   Cholecalciferol (VITAMIN D3) 25 MCG (1000 UT) CAPS Take 2 capsules (2,000 Units total) by mouth daily.   clonazePAM (KLONOPIN) 0.5 MG tablet TAKE ONE TABLET BY MOUTH EVERYDAY AT BEDTIME   cyanocobalamin (V-R VITAMIN B-12) 500 MCG tablet Take 1 tablet (500 mcg total) by mouth every Monday, Wednesday, and Friday.   gabapentin (NEURONTIN) 100 MG capsule Take 1 capsule (100 mg total) by mouth 2 (two) times daily as needed (sedation precautions).   levocetirizine (XYZAL) 5 MG tablet Take 5 mg by mouth every evening. Replaced Allegra, per patient report   levothyroxine (SYNTHROID) 75 MCG tablet TAKE ONE TABLET BY MOUTH EVERY MORNING   omeprazole (PRILOSEC) 40 MG capsule TAKE ONE CAPSULE BY MOUTH EVERY MORNING   oxybutynin (DITROPAN-XL) 5 MG 24 hr tablet Take 1 tablet (5 mg total) by mouth at bedtime.   sertraline (ZOLOFT) 100 MG tablet TAKE ONE TABLET BY MOUTH EVERY MORNING   sertraline (ZOLOFT) 50 MG tablet TAKE ONE TABLET BY MOUTH EVERY MORNING (total dose='150mg'$ )   No facility-administered encounter medications on file as of 12/17/2022.   BP Readings from Last 3 Encounters:  11/25/22 138/70  10/06/22 130/70  09/02/22 126/68    Pulse Readings from Last 3 Encounters:  11/25/22 72  10/06/22 80  09/02/22  70    Lab Results  Component Value Date/Time   HGBA1C 6.2 11/18/2022 07:58 AM   HGBA1C 6.3 08/13/2021 09:35 AM   Lab Results  Component Value Date   CREATININE 0.87 11/18/2022   BUN 20 11/18/2022   GFR 65.76 11/18/2022   NA 141 11/18/2022   K 4.0 11/18/2022   CALCIUM 9.4 11/18/2022   CO2 28 11/18/2022     Last adherence delivery date: 11/30/2022      Patient is due for next adherence delivery on: 12/29/2022  Spoke with patient on 12/18/2022 reviewed medications and coordinated delivery.  Packs: Clonazepam 0.'5mg'$  - 1 tablet bedtime  Levocetirizine '5mg'$  - 1 tablet bedtime Vitamin D3 2000 IU - 1 tablet breakfast Vitamin B12 500 mcg - 1 tablet breakfast Sertraline 50 mg - take 1 tablet at breakfast Omeprazole 40 mg - Take 1 capsule at breakfast Sertraline 100 mg - take 1.5 tablet at breakfast Amlodipine 10 mg - 1 tablet breakfast  Levothyroxine 75 mcg - 1 tablet daily breakfast Atorvastatin 20 mg - 1 tablet daily at breakfast   Patient declined the following medications this month: None declined  No refill request needed.  Confirmed delivery date of 12/29/2022, advised patient that pharmacy will contact them the morning of delivery.   Any concerns about your medications? No  How often do you forget or accidentally miss a dose? Never  Do you use a pillbox? No  Is patient in packaging Yes  If yes  What is the date on your next pill pack? 12/18/2022  Any concerns or  issues with your packaging? No  Recent blood pressure readings are as follows: Patient does not check it at home.    Cycle dispensing form sent to [facility] for review.  [name], CMA

## 2022-12-30 DIAGNOSIS — L578 Other skin changes due to chronic exposure to nonionizing radiation: Secondary | ICD-10-CM | POA: Diagnosis not present

## 2022-12-30 DIAGNOSIS — L538 Other specified erythematous conditions: Secondary | ICD-10-CM | POA: Diagnosis not present

## 2022-12-30 DIAGNOSIS — L570 Actinic keratosis: Secondary | ICD-10-CM | POA: Diagnosis not present

## 2022-12-30 DIAGNOSIS — L820 Inflamed seborrheic keratosis: Secondary | ICD-10-CM | POA: Diagnosis not present

## 2023-01-08 ENCOUNTER — Ambulatory Visit
Admission: RE | Admit: 2023-01-08 | Discharge: 2023-01-08 | Disposition: A | Discharge status: Home / Self Care | Payer: Medicare Other | Source: Ambulatory Visit | Attending: Family Medicine | Admitting: Family Medicine

## 2023-01-08 DIAGNOSIS — Z1231 Encounter for screening mammogram for malignant neoplasm of breast: Secondary | ICD-10-CM | POA: Diagnosis not present

## 2023-01-15 DIAGNOSIS — M5416 Radiculopathy, lumbar region: Secondary | ICD-10-CM | POA: Diagnosis not present

## 2023-01-19 ENCOUNTER — Telehealth: Payer: Self-pay

## 2023-01-19 NOTE — Progress Notes (Unsigned)
Care Management & Coordination Services Pharmacy Team  Reason for Encounter: Medication coordination and delivery  Contacted patient to discuss medications and coordinate [facility] from [facility].  {US HC Outreach:28874}  Cycle dispensing form sent to *** for review.   Last adherence delivery date: 12/29/2022      Patient is due for next adherence delivery on: 01/28/2023  Packs: Clonazepam 0.50m - 1 tablet bedtime  Levocetirizine 546m- 1 tablet bedtime Vitamin D3 2000 IU - 1 tablet breakfast Vitamin B12 500 mcg - 1 tablet breakfast Sertraline 50 mg - take 1 tablet at breakfast Omeprazole 40 mg - Take 1 capsule at breakfast Sertraline 100 mg - take 1.5 tablet at breakfast Amlodipine 10 mg - 1 tablet breakfast  Levothyroxine 75 mcg - 1 tablet daily breakfast Atorvastatin 20 mg - 1 tablet daily at breakfast    Patient declined the following medications this month: None declined  No refill request needed.  {Delivery daUJ:61079082/29/2024  Any concerns about your medications? {yes/no:20286}  How often do you forget or accidentally miss a dose? {Missed doses:25554}  Do you use a pillbox? No  Is patient in packaging {yes/no:20286}  If yes  What is the date on your next pill pack?  Any concerns or issues with your packaging?  Recent blood pressure readings are as follows: Patient does not check it at home.    Chart review: Recent office visits:  None since last contact  Recent consult visits:  01/15/23 RiSuella BroadMD Lumbar Radiculopathy [place] [facility]  None since last contact  Medications: Outpatient Encounter Medications as of 01/19/2023  Medication Sig   amLODipine (NORVASC) 10 MG tablet TAKE ONE TABLET BY MOUTH ONCE DAILY Needs annual physical appt for further refills   atorvastatin (LIPITOR) 20 MG tablet TAKE ONE TABLET BY MOUTH ONCE DAILY   Cholecalciferol (VITAMIN D3) 25 MCG (1000 UT) CAPS Take 2 capsules (2,000 Units  total) by mouth daily.   clonazePAM (KLONOPIN) 0.5 MG tablet TAKE ONE TABLET BY MOUTH EVERYDAY AT BEDTIME   cyanocobalamin (V-R VITAMIN B-12) 500 MCG tablet Take 1 tablet (500 mcg total) by mouth every Monday, Wednesday, and Friday.   gabapentin (NEURONTIN) 100 MG capsule Take 1 capsule (100 mg total) by mouth 2 (two) times daily as needed (sedation precautions).   levocetirizine (XYZAL) 5 MG tablet Take 5 mg by mouth every evening. Replaced Allegra, per patient report   levothyroxine (SYNTHROID) 75 MCG tablet TAKE ONE TABLET BY MOUTH EVERY MORNING   omeprazole (PRILOSEC) 40 MG capsule TAKE ONE CAPSULE BY MOUTH EVERY MORNING   oxybutynin (DITROPAN-XL) 5 MG 24 hr tablet Take 1 tablet (5 mg total) by mouth at bedtime.   sertraline (ZOLOFT) 100 MG tablet TAKE ONE TABLET BY MOUTH EVERY MORNING   sertraline (ZOLOFT) 50 MG tablet TAKE ONE TABLET BY MOUTH EVERY MORNING   No facility-administered encounter medications on file as of 01/19/2023.   BP Readings from Last 3 Encounters:  11/25/22 138/70  10/06/22 130/70  09/02/22 126/68    Pulse Readings from Last 3 Encounters:  11/25/22 72  10/06/22 80  09/02/22 70    Lab Results  Component Value Date/Time   HGBA1C 6.2 11/18/2022 07:58 AM   HGBA1C 6.3 08/13/2021 09:35 AM   Lab Results  Component Value Date   CREATININE 0.87 11/18/2022   BUN 20 11/18/2022   GFR 65.76 11/18/2022   NA 141 11/18/2022   K 4.0 11/18/2022   CALCIUM 9.4 11/18/2022   CO2 28 11/18/2022  Charlene Brooke, PharmD notified  [name], [place] Clinical Pharmacy Assistant [phone]

## 2023-01-19 NOTE — Progress Notes (Incomplete)
Care Management & Coordination Services Pharmacy Team  Reason for Encounter: Medication coordination and delivery  Contacted patient to discuss medications and coordinate delivery from [facility].  {US HC Outreach:28874}  Cycle dispensing form sent to *** for review.   Last adherence delivery date:***      Patient is due for next adherence delivery on: ***  This delivery to include: {Packaging choices:23805}  {Day Supply:23806}  ***  Patient declined the following medications this month: ***  {refills needed:25320}  {Delivery BK:1911189   Any concerns about your medications? {yes/no:20286}  How often do you forget or accidentally miss a dose? {Missed doses:25554}  Do you use a pillbox? {yes/no:20286}  Is patient in packaging {yes/no:20286}  If yes  What is the date on your next pill pack?  Any concerns or issues with your packaging?   Recent blood pressure readings are as follows:***  Recent blood glucose readings are as follows:***   Chart review: Recent office visits:  ***  Recent consult visits:  ***  Hospital visits:  {Hospital DC Yes/No:21091515}  Medications: Outpatient Encounter Medications as of 01/19/2023  Medication Sig  . amLODipine (NORVASC) 10 MG tablet TAKE ONE TABLET BY MOUTH ONCE DAILY Needs annual physical appt for further refills  . atorvastatin (LIPITOR) 20 MG tablet TAKE ONE TABLET BY MOUTH ONCE DAILY  . Cholecalciferol (VITAMIN D3) 25 MCG (1000 UT) CAPS Take 2 capsules (2,000 Units total) by mouth daily.  . clonazePAM (KLONOPIN) 0.5 MG tablet TAKE ONE TABLET BY MOUTH EVERYDAY AT BEDTIME  . cyanocobalamin (V-R VITAMIN B-12) 500 MCG tablet Take 1 tablet (500 mcg total) by mouth every Monday, Wednesday, and Friday.  . gabapentin (NEURONTIN) 100 MG capsule Take 1 capsule (100 mg total) by mouth 2 (two) times daily as needed (sedation precautions).  Marland Kitchen levocetirizine (XYZAL) 5 MG tablet Take 5 mg by mouth every evening. Replaced  Allegra, per patient report  . levothyroxine (SYNTHROID) 75 MCG tablet TAKE ONE TABLET BY MOUTH EVERY MORNING  . omeprazole (PRILOSEC) 40 MG capsule TAKE ONE CAPSULE BY MOUTH EVERY MORNING  . oxybutynin (DITROPAN-XL) 5 MG 24 hr tablet Take 1 tablet (5 mg total) by mouth at bedtime.  . sertraline (ZOLOFT) 100 MG tablet TAKE ONE TABLET BY MOUTH EVERY MORNING  . sertraline (ZOLOFT) 50 MG tablet TAKE ONE TABLET BY MOUTH EVERY MORNING   No facility-administered encounter medications on file as of 01/19/2023.   BP Readings from Last 3 Encounters:  11/25/22 138/70  10/06/22 130/70  09/02/22 126/68    Pulse Readings from Last 3 Encounters:  11/25/22 72  10/06/22 80  09/02/22 70    Lab Results  Component Value Date/Time   HGBA1C 6.2 11/18/2022 07:58 AM   HGBA1C 6.3 08/13/2021 09:35 AM   Lab Results  Component Value Date   CREATININE 0.87 11/18/2022   BUN 20 11/18/2022   GFR 65.76 11/18/2022   NA 141 11/18/2022   K 4.0 11/18/2022   CALCIUM 9.4 11/18/2022   CO2 28 11/18/2022     ***sig

## 2023-01-20 ENCOUNTER — Ambulatory Visit: Payer: Medicare Other | Admitting: Pharmacist

## 2023-01-20 NOTE — Progress Notes (Signed)
Care Management & Coordination Services Pharmacy Note  01/20/2023 Name:  Madlene Hulslander MRN:  [id number] DOB:  23-Feb-1946  Summary: -Reviewed medications; pt affirms compliance and denies issues -HLD: LDL 112 (10/2022), overall improved from 179 after changing to daily statin dosing -HTN: BP at goal per recent clinic vitals -Back pain: pt established with Dr Nelva Bush, has MRI coming up; she is taking a course of steroids, denies steroid side effects  Recommendations/Changes made from today's visit: -No med changes  Follow up plan: -[place] will call patient monthly for medication coordination -Pharmacist follow up PRN    Subjective: Logen Babiak is an 77 y.o. year old female who is a primary patient of [name], MD.  The care coordination team was consulted for assistance with disease management and care coordination needs.    Engaged with patient by telephone for follow up visit.  Recent office visits: 11/25/22 Dr Danise Mina OV: annual - increase fiber/legumes to help cholestero. Try gabapentin 1-2 per day PRN sciatica.   10/06/22 Dr Danise Mina OV: neck strain - continue robaxin. Try flonase for nasal drainage.  Recent consult visits: 01/15/23 Dr Nelva Bush (PM&R): lumbar radiculopathy  09/02/22 Urgent Care - crush injury L thumb - no fracture.  Hospital visits: None in previous 6 months   Objective:  Lab Results  Component Value Date   CREATININE 0.87 11/18/2022   BUN 20 11/18/2022   GFR 65.76 11/18/2022   NA 141 11/18/2022   K 4.0 11/18/2022   CALCIUM 9.4 11/18/2022   CO2 28 11/18/2022   GLUCOSE 92 11/18/2022    Lab Results  Component Value Date/Time   HGBA1C 6.2 11/18/2022 07:58 AM   HGBA1C 6.3 08/13/2021 09:35 AM   GFR 65.76 11/18/2022 07:58 AM   GFR 78.03 08/13/2021 09:35 AM   MICROALBUR 0.7 11/18/2022 07:58 AM   MICROALBUR 0.7 08/13/2021 09:35 AM    Last diabetic Eye exam: No results found for: "HMDIABEYEEXA"  Last diabetic  Foot exam: No results found for: "HMDIABFOOTEX"   Lab Results  Component Value Date   CHOL 185 11/18/2022   HDL 60.10 11/18/2022   LDLCALC 112 (H) 11/18/2022   TRIG 62.0 11/18/2022   CHOLHDL 3 11/18/2022       Latest Ref Rng & Units 11/18/2022    7:58 AM 08/13/2021    9:35 AM 08/13/2020    8:13 AM  Hepatic Function  Total Protein 6.0 - 8.3 g/dL 7.1  7.2  7.2   Albumin 3.5 - 5.2 g/dL 4.2  4.0  4.1   AST 0 - 37 U/L 12  14  15   $ ALT 0 - 35 U/L 11  12  10   $ Alk Phosphatase 39 - 117 U/L 76  78  67   Total Bilirubin 0.2 - 1.2 mg/dL 0.4  0.3  0.4     Lab Results  Component Value Date/Time   TSH 1.09 11/18/2022 07:58 AM   TSH 2.32 08/13/2021 09:35 AM   FREET4 0.66 08/13/2021 09:35 AM   FREET4 0.59 (L) 02/10/2021 09:26 AM       Latest Ref Rng & Units 11/18/2022    7:58 AM 06/12/2020   11:06 AM 01/30/2016    1:14 PM  CBC  WBC 4.0 - 10.5 K/uL 10.8  6.3  6.2   Hemoglobin 12.0 - 15.0 g/dL 13.2  13.2  13.2   Hematocrit 36.0 - 46.0 % 39.0  39.6  39.5   Platelets 150.0 - 400.0 K/uL 237.0  231.0  219.0  Lab Results  Component Value Date/Time   VD25OH 32.37 11/18/2022 07:58 AM   VD25OH 30.62 08/13/2021 09:35 AM   VITAMINB12 781 11/18/2022 07:58 AM   [id number] 08/13/2021 09:35 AM    Clinical ASCVD: No  The 10-year ASCVD risk score (Arnett DK, et al., 2019) is: 21.5%   Values used to calculate the score:     Age: 77 years     Sex: Female     Is Non-Hispanic African American: No     Diabetic: No     Tobacco smoker: No     Systolic Blood Pressure: 0000000 mmHg     Is BP treated: Yes     HDL Cholesterol: 60.1 mg/dL     Total Cholesterol: 185 mg/dL        11/25/2022    1:32 PM 01/20/2022    9:33 AM 08/20/2021   10:45 AM  Depression screen PHQ 2/9  Decreased Interest 0 0 0  Down, Depressed, Hopeless 0 0 0  PHQ - 2 Score 0 0 0  Altered sleeping 0  0  Tired, decreased energy 1  1  Change in appetite 0  0  Feeling bad or failure about yourself  1  1  Trouble  concentrating 0  1  Moving slowly or fidgety/restless 3  2  Suicidal thoughts 0  0  PHQ-9 Score 5  5  Difficult doing work/chores Not difficult at all         11/25/2022    1:32 PM 01/20/2022    9:46 AM 08/20/2021   10:46 AM 08/08/2019    3:17 PM  GAD 7 : Generalized Anxiety Score  Nervous, Anxious, on Edge 3 1 1 3  $ Control/stop worrying 2 0 1 3  Worry too much - different things 1 0 1 3  Trouble relaxing 3 0 2 3  Restless 3 1 3 3  $ Easily annoyed or irritable 1 0 1 3  Afraid - awful might happen 0 0 0 0  Total GAD 7 Score 13 2 9 18  $ Anxiety Difficulty Not difficult at all Not difficult at all       Social History   Tobacco Use  Smoking Status Never  Smokeless Tobacco Never   BP Readings from Last 3 Encounters:  11/25/22 138/70  10/06/22 130/70  09/02/22 126/68   Pulse Readings from Last 3 Encounters:  11/25/22 72  10/06/22 80  09/02/22 70   Wt Readings from Last 3 Encounters:  11/25/22 151 lb 3.2 oz (68.6 kg)  10/06/22 155 lb (70.3 kg)  01/23/22 154 lb 6 oz (70 kg)   BMI Readings from Last 3 Encounters:  11/25/22 28.11 kg/m  10/06/22 28.12 kg/m  01/23/22 28.01 kg/m    Allergies  Allergen Reactions   Hctz [Hydrochlorothiazide]     Blood came to the surface of her legs   Sulfa Antibiotics Rash    Medications Reviewed Today     Reviewed by Charlton Haws, [facility] (Pharmacist) on 01/20/23 at [place] List Status: <None>   Medication Order Taking? Sig Documenting Provider Last Dose Status Informant  amLODipine (NORVASC) 10 MG tablet [id number] Yes TAKE ONE TABLET BY MOUTH ONCE DAILY Needs annual physical appt for further refills [name], MD Taking Active   atorvastatin (LIPITOR) 20 MG tablet [id number] Yes TAKE ONE TABLET BY MOUTH ONCE DAILY [name], MD Taking Active   Cholecalciferol (VITAMIN D3) 25 MCG (1000 UT) CAPS [id number] Yes Take 2 capsules (2,000 Units total) by mouth  daily. [name], MD Taking Active   clonazePAM  Bobbye [place]) 0.5 MG tablet [id number] Yes TAKE ONE TABLET BY MOUTH EVERYDAY AT BEDTIME [name], MD Taking Active   cyanocobalamin (V-R VITAMIN B-12) 500 MCG tablet [id number] Yes Take 1 tablet (500 mcg total) by mouth every Monday, Wednesday, and Friday. [name], MD Taking Active   gabapentin (NEURONTIN) 100 MG capsule [id number] Yes Take 1 capsule (100 mg total) by mouth 2 (two) times daily as needed (sedation precautions). [name], MD Taking Active   levocetirizine (XYZAL) 5 MG tablet [id number] Yes Take 5 mg by mouth every evening. Replaced Allegra, per patient report [provider] Taking Active Self  levothyroxine (SYNTHROID) 75 MCG tablet [id number] Yes TAKE ONE TABLET BY MOUTH EVERY MORNING [name], MD Taking Active   omeprazole (PRILOSEC) 40 MG capsule [id number] Yes TAKE ONE CAPSULE BY MOUTH EVERY MORNING [name], MD Taking Active   predniSONE (DELTASONE) 10 MG tablet [id number] Yes Take 10 mg by mouth as directed. 6 day dosepak [provider] Taking Active   sertraline (ZOLOFT) 100 MG tablet [id number] Yes TAKE ONE TABLET BY MOUTH EVERY MORNING [name], MD Taking Active   sertraline (ZOLOFT) 50 MG tablet [id number] Yes TAKE ONE TABLET BY MOUTH EVERY MORNING [name], MD Taking Active             SDOH:  (Social Determinants of Health) assessments and interventions performed: No SDOH Interventions    Flowsheet Row Chronic Care Management from 01/20/2022 in [place] at [place] Management from 06/09/2021 in [place] at [place] from 08/13/2020 in [place] at [facility] Visit from 08/08/2019 in [place] at [place] from 03/17/2018 in [place] at [facility] Visit from 08/12/2015 in [place] at [place]  SDOH  Interventions        Transportation Interventions Intervention Not Indicated -- -- -- -- --  Depression Interventions/Treatment  -- -- [id number] Score <4 Follow-up Not Indicated Medication, Counseling, Currently on Treatment --  [referral to PCP] Referral to Psychiatry  Financial Strain Interventions Intervention Not Indicated Intervention Not Indicated -- -- -- --       Medication Assistance: None required.  Patient affirms current coverage meets needs.  Medication Access: Within the past 30 days, how often has patient missed a dose of medication? 0 Is a pillbox or other method used to improve adherence? Yes  Factors that may affect medication adherence? no barriers identified Are meds synced by current pharmacy? Yes  Are meds delivered by current pharmacy? Yes  Does patient experience delays in picking up medications due to transportation concerns? No   [facility] Reviewed: Is patient disadvantaged to use [facility]?: No  Current Rx insurance plan: [place] Name and location of Current pharmacy:  [facility] - [place], [place] - [place] [address] [place] [address] Phone: [phone] Fax: [phone]  [facility] services reviewed with patient today?: Yes  30-day packs: last delivery 12/29/22 Clonazepam 0.52m - 1 tablet bedtime  Levocetirizine 571m- 1 tablet bedtime Vitamin D3 2000 IU - 1 tablet breakfast Vitamin B12 500 mcg - 1 tablet breakfast Sertraline 100 mg - take 1 tablet at breakfast Sertraline 50 mg - take 1 tablet at breakfast Omeprazole 40 mg - Take 1 capsule at breakfast Amlodipine 10 mg - 1 tablet  breakfast  Levothyroxine 75 mcg - 1 tablet daily breakfast Atorvastatin 20 mg - 1 tablet daily at breakfast   Compliance/Adherence/Medication fill history: Care Gaps: None  Star-Rating Drugs: Atorvastatin - PDC 99%   ASSESSMENT / PLAN  Hypertension (BP goal <140/90) -Controlled,  per clinic readings  -Current treatment: Amlodipine 10 mg daily - Appropriate, Effective, Safe, Accessible -Medications previously tried: HCTZ - unable to tolerate  -Denies dizziness, imbalance, or falls.  -Recommended to continue current medication  Hyperlipidemia: (LDL goal < 100) -Not ideally controlled - LDL 112 (10/2022) deteriorated slightly -Hx significant reduction with daily atorvastatin - previously LDL improved from 179 to 103 increasing from 3x weekly to daily -Current treatment: Atorvastatin 20 mg daily - Appropriate, Effective, Safe, Accessible -Medications previously tried: none -Educated on Cholesterol goals; Importance of limiting foods high in cholesterol; -Recommended to continue current medication  MDD with anxiety (Goal: Improve symptoms as evidenced by PHQ-9, GAD-7) -Controlled - per pt report -PHQ9: 5 (10/2022) - mild depression -GAD7: 13 (10/2022) - moderate anxiety -Current treatment  Sertraline 150 mg daily - Appropriate, Effective, Safe, Accessible Clonazepam daily HS - Appropriate, Effective, Safe, Accessible -Medications previously tried: none reported  -Recommended to continue current medication  Back pain -Established with Dr Nelva Bush, MRI pending; she reports she was recently prescribed a steroid dosepak, denies side effects with steroids -Current treatment  Gabapentin 100 mg 1-2 tab daily PRN - Appropriate, Effective, Safe, Accessible Prednisone 10 mg - 6 day taper - Appropriate, Effective, Safe, Accessible -Medications previously tried: n/a  -Recommended to continue current medication; f/u with Dr Nelva Bush  Health Maintenance -Vaccine gaps: Shingrix -Sciatica - trial gabapentin -Hx urinary incontinence - trial of oxybutynin was ineffective -OTC: B12, Vit D, levocetirizine    Charlene Brooke, PharmD, BCACP Clinical Pharmacist [facility] at [facility] [phone]

## 2023-01-20 NOTE — Patient Instructions (Signed)
Visit Information  Phone number for Pharmacist: [phone]  Thank you for meeting with me to discuss your medications! Below is a summary of what we talked about during the visit:   Recommendations/Changes made from today's visit: -No med changes  Follow up plan: -Health Concierge will call patient monthly for medication coordination -Pharmacist follow up PRN   Charlene Brooke, PharmD, BCACP Clinical Pharmacist [place] [facility] at [facility] [phone]

## 2023-01-23 DIAGNOSIS — M5451 Vertebrogenic low back pain: Secondary | ICD-10-CM | POA: Diagnosis not present

## 2023-01-23 DIAGNOSIS — M5459 Other low back pain: Secondary | ICD-10-CM | POA: Diagnosis not present

## 2023-02-04 DIAGNOSIS — M5416 Radiculopathy, lumbar region: Secondary | ICD-10-CM | POA: Diagnosis not present

## 2023-02-04 DIAGNOSIS — M48062 Spinal stenosis, lumbar region with neurogenic claudication: Secondary | ICD-10-CM | POA: Diagnosis not present

## 2023-02-15 ENCOUNTER — Other Ambulatory Visit: Payer: Self-pay | Admitting: Family Medicine

## 2023-02-15 DIAGNOSIS — E039 Hypothyroidism, unspecified: Secondary | ICD-10-CM

## 2023-02-15 DIAGNOSIS — E785 Hyperlipidemia, unspecified: Secondary | ICD-10-CM

## 2023-02-16 DIAGNOSIS — M5136 Other intervertebral disc degeneration, lumbar region: Secondary | ICD-10-CM | POA: Diagnosis not present

## 2023-02-16 DIAGNOSIS — M5451 Vertebrogenic low back pain: Secondary | ICD-10-CM | POA: Diagnosis not present

## 2023-02-17 ENCOUNTER — Telehealth: Payer: Self-pay

## 2023-02-17 NOTE — Progress Notes (Signed)
Care Management & Coordination Services Pharmacy Team  Reason for Encounter: Medication Coordination and Delivery  Contacted patient to discuss medications and coordinate delivery from [facility].  Spoke with patient on 02/17/2023   Cycle dispensing form sent to [facility], [facility] for review.   Last adherence delivery date: 01/28/2023      Patient is due for next adherence delivery on: 02/26/2023  This delivery to include: Adherence Packaging  30 Days  Packs: Clonazepam 0.5mg  - 1 tablet bedtime  Levocetirizine 5mg  - 1 tablet bedtime Vitamin D3 2000 IU - 1 tablet breakfast Vitamin B12 500 mcg - 1 tablet breakfast Sertraline 50 mg - take 1 tablet at breakfast Omeprazole 40 mg - Take 1 capsule at breakfast Sertraline 100 mg - take 1.5 tablet at breakfast Amlodipine 10 mg - 1 tablet breakfast  Levothyroxine 75 mcg - 1 tablet daily breakfast Atorvastatin 20 mg - 1 tablet daily at breakfast    Patient declined the following medications this month: None declined  Refills requested from providers include: Complete Levothyroxine 75 mcg - 1 tablet daily breakfast Atorvastatin 20 mg - 1 tablet daily at breakfast   Confirmed delivery date of 02/26/2023, advised patient that pharmacy will contact them the morning of delivery.   Any concerns about your medications? No  How often do you forget or accidentally miss a dose? Rarely  Do you use a pillbox? No  Is patient in packaging Yes  If yes  What is the date on your next pill pack? 02/17/2023  Any concerns or issues with your packaging? No  Recent blood pressure readings are as follows: Patient does not check it at home.    Chart review: Recent office visits:  None since last contact  Recent consult visits:  02/04/23 Suella Broad, MD ([facility]) Spinal stenosis of lumbar region No other information 01/23/23 Ninetta Lights ([facility]) Pain in lumbar spine No other information  Hospital visits:  None in previous  6 months  Medications: Outpatient Encounter Medications as of 02/17/2023  Medication Sig   amLODipine (NORVASC) 10 MG tablet TAKE ONE TABLET BY MOUTH ONCE DAILY Needs annual physical appt for further refills   atorvastatin (LIPITOR) 20 MG tablet TAKE ONE TABLET BY MOUTH ONCE DAILY   Cholecalciferol (VITAMIN D3) 25 MCG (1000 UT) CAPS Take 2 capsules (2,000 Units total) by mouth daily.   clonazePAM (KLONOPIN) 0.5 MG tablet TAKE ONE TABLET BY MOUTH EVERYDAY AT BEDTIME   cyanocobalamin (V-R VITAMIN B-12) 500 MCG tablet Take 1 tablet (500 mcg total) by mouth every Monday, Wednesday, and Friday.   gabapentin (NEURONTIN) 100 MG capsule Take 1 capsule (100 mg total) by mouth 2 (two) times daily as needed (sedation precautions).   levocetirizine (XYZAL) 5 MG tablet Take 5 mg by mouth every evening. Replaced Allegra, per patient report   levothyroxine (SYNTHROID) 75 MCG tablet TAKE ONE TABLET BY MOUTH EVERY MORNING   omeprazole (PRILOSEC) 40 MG capsule TAKE ONE CAPSULE BY MOUTH EVERY MORNING   predniSONE (DELTASONE) 10 MG tablet Take 10 mg by mouth as directed. 6 day dosepak   sertraline (ZOLOFT) 100 MG tablet TAKE ONE TABLET BY MOUTH EVERY MORNING   sertraline (ZOLOFT) 50 MG tablet TAKE ONE TABLET BY MOUTH EVERY MORNING   No facility-administered encounter medications on file as of 02/17/2023.   BP Readings from Last 3 Encounters:  11/25/22 138/70  10/06/22 130/70  09/02/22 126/68    Pulse Readings from Last 3 Encounters:  11/25/22 72  10/06/22 80  09/02/22 70  Lab Results  Component Value Date/Time   HGBA1C 6.2 11/18/2022 07:58 AM   HGBA1C 6.3 08/13/2021 09:35 AM   Lab Results  Component Value Date   CREATININE 0.87 11/18/2022   BUN 20 11/18/2022   GFR 65.76 11/18/2022   NA 141 11/18/2022   K 4.0 11/18/2022   CALCIUM 9.4 11/18/2022   CO2 28 11/18/2022   Charlene Brooke, CPP notified   [name], [place] Clinical Pharmacy Assistant [phone]

## 2023-02-23 DIAGNOSIS — M5416 Radiculopathy, lumbar region: Secondary | ICD-10-CM | POA: Diagnosis not present

## 2023-03-01 ENCOUNTER — Ambulatory Visit (INDEPENDENT_AMBULATORY_CARE_PROVIDER_SITE_OTHER): Payer: Medicare Other | Admitting: Nurse Practitioner

## 2023-03-01 ENCOUNTER — Encounter: Payer: Self-pay | Admitting: Nurse Practitioner

## 2023-03-01 ENCOUNTER — Ambulatory Visit (INDEPENDENT_AMBULATORY_CARE_PROVIDER_SITE_OTHER)
Admission: RE | Admit: 2023-03-01 | Discharge: 2023-03-01 | Disposition: A | Discharge status: Home / Self Care | Payer: Medicare Other | Source: Ambulatory Visit | Attending: Nurse Practitioner | Admitting: Nurse Practitioner

## 2023-03-01 VITALS — BP 116/62 | HR 68 | Temp 98.1°F | Resp 16 | Ht 61.5 in | Wt 157.4 lb

## 2023-03-01 DIAGNOSIS — R0602 Shortness of breath: Secondary | ICD-10-CM | POA: Insufficient documentation

## 2023-03-01 DIAGNOSIS — E538 Deficiency of other specified B group vitamins: Secondary | ICD-10-CM | POA: Diagnosis not present

## 2023-03-01 DIAGNOSIS — R0600 Dyspnea, unspecified: Secondary | ICD-10-CM | POA: Diagnosis not present

## 2023-03-01 DIAGNOSIS — R0609 Other forms of dyspnea: Secondary | ICD-10-CM | POA: Insufficient documentation

## 2023-03-01 DIAGNOSIS — R051 Acute cough: Secondary | ICD-10-CM | POA: Insufficient documentation

## 2023-03-01 DIAGNOSIS — R5382 Chronic fatigue, unspecified: Secondary | ICD-10-CM | POA: Diagnosis not present

## 2023-03-01 DIAGNOSIS — E559 Vitamin D deficiency, unspecified: Secondary | ICD-10-CM

## 2023-03-01 DIAGNOSIS — E039 Hypothyroidism, unspecified: Secondary | ICD-10-CM

## 2023-03-01 LAB — POC COVID19 BINAXNOW: SARS Coronavirus 2 Ag: NEGATIVE

## 2023-03-01 MED ORDER — CETIRIZINE HCL 10 MG PO TABS: 10.0000 mg | ORAL_TABLET | Freq: Every day | ORAL | 11 refills | Status: DC

## 2023-03-01 NOTE — Assessment & Plan Note (Signed)
Acute on chronic fatigue.  She has been helping take care of her 75 year old mother.  States she is sleeping states she is sleeping well does not snore per her report.  Will check basic labs and if normal follow-up with primary care provider to discuss further actions

## 2023-03-01 NOTE — Assessment & Plan Note (Signed)
History of the same.  Patient's last TSH was within normal limits.  Patient been having fatigue we will recheck pending TSH

## 2023-03-01 NOTE — Progress Notes (Signed)
Acute Office Visit  Subjective:     Patient ID: Donna Vargas, female    DOB: 08-02-1946, 77 y.o.   MRN: [id number]  Chief Complaint  Patient presents with   Fatigue    A while    Allergies    HPI Patient is in today for fatigue and allergies with a history of HTN, GERD, hypothyroidism, vitmain D  def, B12 def, prediabetes   Fatigue: states that she has been the past 6 weeks. States that she is helping with her mother who is 7. States that when she drives she is feeling sleepy States that she is doing a lot to help with her mother. States that she goes to bed around 11-2 and get up around 8am. States that she does feel rested. States that she does not snore  Short of breath: states that it has been for a little while. At least 6 States that with exertion she feels it more. States that she thinks it is her allergies. States that she does take the xyzal and has been on it for an extended period of time    Review of Systems  Constitutional:  Positive for chills and malaise/fatigue. Negative for fever.       Appetite is normal  Fluid intake is  good   HENT:  Positive for tinnitus. Negative for ear discharge, ear pain, sinus pain and sore throat.   Respiratory:  Positive for cough and shortness of breath. Negative for sputum production.   Cardiovascular:  Negative for chest pain.  Musculoskeletal:  Negative for joint pain and myalgias.  Neurological:  Negative for headaches.        Objective:    BP 116/62   Pulse 68   Temp 98.1 F (36.7 C)   Resp 16   Ht 5' 1.5" (1.562 m)   Wt 157 lb 6 oz (71.4 kg)   SpO2 97%   BMI 29.25 kg/m    Physical Exam Vitals and nursing note reviewed.  Constitutional:      Appearance: Normal appearance.  HENT:     Right Ear: Tympanic membrane, ear canal and external ear normal.     Left Ear: Tympanic membrane, ear canal and external ear normal.     Nose:     Right Sinus: No maxillary sinus tenderness or frontal sinus  tenderness.     Left Sinus: No maxillary sinus tenderness or frontal sinus tenderness.     Mouth/Throat:     Mouth: Mucous membranes are moist.     Pharynx: Oropharynx is clear.  Cardiovascular:     Rate and Rhythm: Normal rate and regular rhythm.     Heart sounds: Normal heart sounds.  Pulmonary:     Effort: Pulmonary effort is normal.     Breath sounds: Normal breath sounds.  Lymphadenopathy:     Cervical: No cervical adenopathy.  Neurological:     Mental Status: She is alert.     Results for orders placed or performed in visit on 03/01/23  POC COVID-19 BinaxNow  Result Value Ref Range   SARS Coronavirus 2 Ag Negative Negative        Assessment & Plan:   Problem List Items Addressed This Visit       Endocrine   Hypothyroidism    History of the same.  Patient's last TSH was within normal limits.  Patient been having fatigue we will recheck pending TSH      Relevant Orders   TSH  Other   Low serum vitamin B12    History of the same last vitamin B12 level within normal limits pending vitamin B 12 level today      Relevant Orders   Vitamin B12   Chronic fatigue    Acute on chronic fatigue.  She has been helping take care of her 16 year old mother.  States she is sleeping states she is sleeping well does not snore per her report.  Will check basic labs and if normal follow-up with primary care provider to discuss further actions      Relevant Orders   CBC   Basic metabolic panel   Vitamin [id number]   TSH   VITAMIN D 25 Hydroxy (Vit-D Deficiency, Fractures)   Vitamin D deficiency    History of the same last vitamin D within normal limits pending vitamin D today      Relevant Orders   VITAMIN D 25 Hydroxy (Vit-D Deficiency, Fractures)   Acute cough - Primary    COVID test negative in office.      Relevant Medications   cetirizine (ZYRTEC) 10 MG tablet   Other Relevant Orders   POC COVID-19 BinaxNow (Completed)   DG Chest 2 View   Dyspnea on exertion     Complains of some DOE.  Will check basic labs inclusive of TSH and chest x-ray.  Follow-up if no improvement      Relevant Orders   DG Chest 2 View    Meds ordered this encounter  Medications   cetirizine (ZYRTEC) 10 MG tablet    Sig: Take 1 tablet (10 mg total) by mouth daily.    Dispense:  30 tablet    Refill:  11    D/C xyzal    Order Specific Question:   Supervising Provider    Answer:   Loura Pardon A [[id number]]    Return if symptoms worsen or fail to improve.  Romilda Garret, NP

## 2023-03-01 NOTE — Assessment & Plan Note (Signed)
COVID test negative in office. 

## 2023-03-01 NOTE — Assessment & Plan Note (Signed)
Complains of some DOE.  Will check basic labs inclusive of TSH and chest x-ray.  Follow-up if no improvement

## 2023-03-01 NOTE — Assessment & Plan Note (Signed)
History of the same last vitamin D within normal limits pending vitamin D today

## 2023-03-01 NOTE — Assessment & Plan Note (Signed)
History of the same last vitamin B12 level within normal limits pending vitamin B 12 level today

## 2023-03-01 NOTE — Patient Instructions (Signed)
Nice to see you today We will switch your allergy pills. We will stop the levoceterizine and start zyrtec. I have sent a script to the [facility]  Follow up if no improvement

## 2023-03-02 LAB — BASIC METABOLIC PANEL
BUN: 17 mg/dL (ref 6–23)
CO2: 28 mEq/L (ref 19–32)
Calcium: 8.9 mg/dL (ref 8.4–10.5)
Chloride: 103 mEq/L (ref 96–112)
Creatinine, Ser: 1.02 mg/dL (ref 0.40–1.20)
GFR: 54.22 mL/min — ABNORMAL LOW (ref >60.00)
Glucose, Bld: 103 mg/dL — ABNORMAL HIGH (ref 70–99)
Potassium: 3.8 mEq/L (ref 3.5–5.1)
Sodium: 137 mEq/L (ref 135–145)

## 2023-03-02 LAB — CBC
HCT: 38.3 % (ref 36.0–46.0)
Hemoglobin: 12.7 g/dL (ref 12.0–15.0)
MCHC: 33.1 g/dL (ref 30.0–36.0)
MCV: 93.2 fl (ref 78.0–100.0)
Platelets: 204 10*3/uL (ref 150.0–400.0)
RBC: 4.11 Mil/uL (ref 3.87–5.11)
RDW: 14.5 % (ref 11.5–15.5)
WBC: 10.5 10*3/uL (ref 4.0–10.5)

## 2023-03-02 LAB — VITAMIN D 25 HYDROXY (VIT D DEFICIENCY, FRACTURES): VITD: 38.01 ng/mL (ref 30.00–100.00)

## 2023-03-02 LAB — VITAMIN B12: Vitamin B-12: 728 pg/mL (ref 211–911)

## 2023-03-02 LAB — TSH: TSH: 1.96 u[IU]/mL (ref 0.35–5.50)

## 2023-03-08 ENCOUNTER — Encounter: Payer: Self-pay | Admitting: Family Medicine

## 2023-03-08 ENCOUNTER — Ambulatory Visit (INDEPENDENT_AMBULATORY_CARE_PROVIDER_SITE_OTHER): Payer: Medicare Other | Admitting: Family Medicine

## 2023-03-08 VITALS — BP 132/64 | HR 94 | Temp 98.9°F | Ht 61.5 in | Wt 155.4 lb

## 2023-03-08 DIAGNOSIS — J069 Acute upper respiratory infection, unspecified: Secondary | ICD-10-CM | POA: Diagnosis not present

## 2023-03-08 DIAGNOSIS — J3089 Other allergic rhinitis: Secondary | ICD-10-CM | POA: Diagnosis not present

## 2023-03-08 MED ORDER — PREDNISONE 10 MG PO TABS: ORAL_TABLET | ORAL | 0 refills | Status: DC

## 2023-03-08 MED ORDER — FLUTICASONE PROPIONATE 50 MCG/ACT NA SUSP: 2.0000 | Freq: Every day | NASAL | 2 refills | Status: AC

## 2023-03-08 NOTE — Assessment & Plan Note (Signed)
Struggling with congestoin both from allergies and uri  Enc her to stop rapid acting nasal spray   Will try flonase again and see if tolerated

## 2023-03-08 NOTE — Progress Notes (Signed)
Subjective:    Patient ID: Donna BostonSylvia Greeson Vargas, female    DOB: 1946-01-31, 77 y.o.   MRN: [id number]  HPI [age] yo pt of Dr Reece AgarG presents with uri symptoms  Has a h/o HTN and hypothyroidism   Wt Readings from Last 3 Encounters:  03/08/23 155 lb 6 oz (70.5 kg)  03/01/23 157 lb 6 oz (71.4 kg)  11/25/22 151 lb 3.2 oz (68.6 kg)   28.88 kg/m   Vitals:   03/08/23 1428  BP: 132/64  Pulse: 94  Temp: 98.9 F (37.2 C)  SpO2: 97%    Was seen on 4/1 by NP Cable Noted fatigue and worse allergic rhinitis at the time Covid test was neg then   DG Chest 2 View  Result Date: 03/02/2023 CLINICAL DATA:  Dyspnea on exertion. EXAM: CHEST - 2 VIEW COMPARISON:  AP chest 10/02/2008 FINDINGS: Cardiac silhouette and mediastinal contours are within normal limits. Unchanged density lateral to the inferior left heart, likely normal epicardial fat pad. The lungs are clear. No pleural effusion or pneumothorax. Mild multilevel degenerative disc changes of the thoracic spine. Mild dextrocurvature of the upper lumbar spine. IMPRESSION: No active cardiopulmonary disease. Electronically Signed   By: Neita Garnetonald  Viola M.D.   On: 03/02/2023 09:58    Reassuring  Changed allergy med to zyrtec    Since then still has congestion and cough   Chills at night /no fever however  No body aches   Very congested  No facial pain or pressure  Ears-has tinnitus  Throat is fine   Cough - deep chest cough  Feels a rattle but nothing coming out yet  No wheezing Not sob    Otc  Nasal spray - ? 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   Vitamin D deficiency 08/19/2020   Low serum vitamin B12 06/12/2020    Chronic fatigue 06/12/2020   Tinnitus 11/21/2019   Osteopenia 10/19/2019   Carotid stenosis 08/09/2019   Lumbar herniated disc 07/19/2019   GERD (gastroesophageal reflux disease) 01/11/2019   Lumbar scoliosis 12/10/2018   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   Palpitations 09/06/2018   Left lateral epicondylitis 12/06/2017   Hypothyroidism 08/18/2017   Hearing loss due to cerumen impaction 07/09/2017   Abnormal drug screen 05/30/2016   HLD (hyperlipidemia) 10/17/2015   Medicare annual wellness visit, subsequent 09/11/2014   Advanced care planning/counseling discussion 09/11/2014   Right thyroid nodule 09/11/2014   Pain in lateral portion of right knee 08/31/2014   Surgical menopause 08/21/2014   Perennial allergic rhinitis    MDD (major depressive disorder), recurrent episode, moderate    Osteoarthritis    Hypertension    Prediabetes 06/30/2014   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   History of asthma 1990s   with bronchitis   History of diverticulitis    ?IBS  Hypertension    Hypothyroidism 2015   ?by prior PCP   Osteopenia 10/19/2019   DEXA 10/2019 T -1.2 hip (osteopenia)   Perennial allergic rhinitis    allergy shots (sharma)   Prediabetes 06/2014   Right thyroid nodule 09/11/2014   S/p biopsy showing benign colloid nodule (08/2014)    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 CA125 level elevated - but no cancer   Social History   Tobacco Use   Smoking status: Never   Smokeless tobacco: Never  Vaping Use   Vaping Use: Never used  Substance Use Topics   Alcohol use: No    Alcohol/week: 0.0 standard drinks of alcohol   Drug use: No   Family History  Problem Relation Age of Onset   Cancer Mother        ovarian x2   Ovarian cancer Mother    CAD Father        unclear   Breast cancer Maternal Grandmother    CAD Maternal Grandfather    Breast cancer Cousin        maternal   Breast cancer Cousin        paternal   Hypertension Neg Hx    Diabetes Neg Hx    Allergies  Allergen Reactions   Hctz [Hydrochlorothiazide]     Blood came to the surface of her legs   Sulfa Antibiotics Rash   Current Outpatient Medications on File Prior to Visit  Medication Sig Dispense Refill   amLODipine (NORVASC) 10 MG tablet TAKE ONE TABLET BY MOUTH ONCE DAILY Needs annual physical appt for further refills 90 tablet 2   atorvastatin (LIPITOR) 20 MG tablet TAKE ONE TABLET BY MOUTH ONCE DAILY 90 tablet 3   cetirizine (ZYRTEC) 10 MG tablet Take 1 tablet (10 mg total) by mouth daily. 30 tablet 11   Cholecalciferol (VITAMIN D3) 25 MCG (1000 UT) CAPS Take 2 capsules (2,000 Units total) by mouth daily.     clonazePAM (KLONOPIN) 0.5 MG tablet TAKE ONE TABLET BY MOUTH EVERYDAY AT BEDTIME 30 tablet 3   cyanocobalamin (V-R VITAMIN B-12) 500 MCG tablet Take 1 tablet (500 mcg total) by mouth every Monday, Wednesday, and Friday.     gabapentin (NEURONTIN) 100 MG capsule Take 1 capsule (100 mg total) by mouth 2 (two) times daily as needed (sedation precautions). 60 capsule 3   levothyroxine (SYNTHROID) 75 MCG tablet TAKE ONE TABLET BY MOUTH EVERY MORNING 90 tablet 3   omeprazole (PRILOSEC) 40 MG capsule  TAKE ONE CAPSULE BY MOUTH EVERY MORNING 90 capsule 2   sertraline (ZOLOFT) 100 MG tablet TAKE ONE TABLET BY MOUTH EVERY MORNING 90 tablet 2   sertraline (ZOLOFT) 50 MG tablet TAKE ONE TABLET BY MOUTH EVERY MORNING 90 tablet 2   No current facility-administered medications on file prior to visit.     Review of Systems  Constitutional:  Positive for appetite change and fatigue. Negative for fever.  HENT:  Positive for congestion,  postnasal drip, rhinorrhea, sneezing and sore throat. Negative for ear pain, sinus pressure and sinus pain.   Eyes:  Negative for pain and discharge.  Respiratory:  Positive for cough. Negative for shortness of breath, wheezing and stridor.   Cardiovascular:  Negative for chest pain.  Gastrointestinal:  Negative for diarrhea, nausea and vomiting.  Genitourinary:  Negative for frequency, hematuria and urgency.  Musculoskeletal:  Negative for arthralgias and myalgias.  Skin:  Negative for rash.  Neurological:  Positive for headaches. Negative for dizziness, weakness and light-headedness.  Psychiatric/Behavioral:  Negative for confusion and dysphoric mood.        Objective:   Physical Exam Constitutional:      General: She is not in acute distress.    Appearance: Normal appearance. She is well-developed and normal weight. She is not ill-appearing, toxic-appearing or diaphoretic.  HENT:     Head: Normocephalic and atraumatic.     Comments: Nares are injected and congested      Right Ear: Tympanic membrane, ear canal and external ear normal.     Left Ear: Tympanic membrane, ear canal and external ear normal.     Nose: Congestion and rhinorrhea present.     Mouth/Throat:     Mouth: Mucous membranes are moist.     Pharynx: Oropharynx is clear. No oropharyngeal exudate or posterior oropharyngeal erythema.     Comments: Clear pnd  Eyes:     General:        Right eye: No discharge.        Left eye: No discharge.     Conjunctiva/sclera: Conjunctivae normal.      Pupils: Pupils are equal, round, and reactive to light.  Cardiovascular:     Rate and Rhythm: Normal rate.     Heart sounds: Normal heart sounds.  Pulmonary:     Effort: Pulmonary effort is normal. No respiratory distress.     Breath sounds: No stridor. Rhonchi present. No wheezing or rales.     Comments: Few scattered rhonchi No wheezing  Chest:     Chest wall: No tenderness.  Musculoskeletal:     Cervical back: Normal range of motion and neck supple.  Lymphadenopathy:     Cervical: No cervical adenopathy.  Skin:    General: Skin is warm and dry.     Capillary Refill: Capillary refill takes less than 2 seconds.     Findings: No rash.  Neurological:     Mental Status: She is alert.     Cranial Nerves: No cranial nerve deficit.  Psychiatric:        Mood and Affect: Mood normal.           Assessment & Plan:   Problem List Items Addressed This Visit       Respiratory   Perennial allergic rhinitis    Struggling with congestoin both from allergies and uri  Enc her to stop rapid acting nasal spray   Will try flonase again and see if tolerated      Viral URI with cough - Primary    Reviewed visit/eval/plan from NP Cable on 4/1 Reassuring neg cxr and neg covid testing She still suffers from congestion and cough  Suspect this is worsened by rebound congestion from fast acting nasal spray Reassuring exam Disc sympt control-see AVS ER precautions noted  Sent in prednisone taper for bronchitis and congestion symptoms  Also flonase to use through allergy season after this  Update if not starting to improve in a week or if worsening

## 2023-03-08 NOTE — Assessment & Plan Note (Signed)
Reviewed visit/eval/plan from NP Cable on 4/1 Reassuring neg cxr and neg covid testing She still suffers from congestion and cough  Suspect this is worsened by rebound congestion from fast acting nasal spray Reassuring exam Disc sympt control-see AVS ER precautions noted  Sent in prednisone taper for bronchitis and congestion symptoms  Also flonase to use through allergy season after this  Update if not starting to improve in a week or if worsening

## 2023-03-08 NOTE — Patient Instructions (Addendum)
Stop your current nasal spray   Change to flonase nasal spray daily through the allergy season   Take prednisone as directed  It will make you feel hyper and hungry   Continue the mucinex DM   Drink fluids   Update if not starting to improve in a week or if worsening    Watch for fever  Watch for facial pain or pressure

## 2023-03-17 ENCOUNTER — Other Ambulatory Visit: Payer: Self-pay | Admitting: Family Medicine

## 2023-03-17 ENCOUNTER — Telehealth: Payer: Self-pay

## 2023-03-17 DIAGNOSIS — F331 Major depressive disorder, recurrent, moderate: Secondary | ICD-10-CM

## 2023-03-17 NOTE — Progress Notes (Signed)
Care Management & Coordination Services Pharmacy Team  Reason for Encounter: Medication Coordination and Delivery  Contacted patient to discuss medications and coordinate delivery from [facility].  Spoke with patient on 03/18/2023  Cycle dispensing form sent to Chrys Racer, PharmD for review.   Last adherence delivery date: 02/26/2023      Patient is due for next adherence delivery on: 03/30/2023   This delivery to include: Adherence Packaging  30 Days  Packs: Clonazepam 0.5mg  - 1 tablet bedtime  Cetrizine 10 mg - 1 tablet at bedtime Vitamin D3 2000 IU - 1 tablet breakfast Vitamin B12 500 mcg - 1 tablet breakfast Sertraline 50 mg - take 1 tablet at breakfast Omeprazole 40 mg - Take 1 capsule at breakfast Sertraline 100 mg - take 1.5 tablet at breakfast Amlodipine 10 mg - 1 tablet breakfast  Levothyroxine 75 mcg - 1 tablet daily breakfast Atorvastatin 20 mg - 1 tablet daily at breakfast    Patient declined the following medications this month: None declined  Refills requested from providers include: No complete Clonazepam 0.5mg  - 1 tablet bedtime   Confirmed delivery date of 03/30/2023, advised patient that pharmacy will contact them the morning of delivery.   Any concerns about your medications? No  How often do you forget or accidentally miss a dose? Never  Do you use a pillbox? No  Is patient in packaging Yes  If yes  What is the date on your next pill pack? 03/17/2023  Any concerns or issues with your packaging? No  Recent blood pressure readings are as follows: Patient does not check it at home.    Chart review: Recent office visits:  03/01/23 Mordecai Maes, NP Acute Cough Start: ZYRTEC 10 MG tablet Stop: (change) XYZAL 5 MG tablet Stop (completed): Prednisone 10 mg  Recent consult visits:  02/23/23 Sheran Luz ([facility]th[facility]) Lumbar radiculopathy No other information  Hospital visits:  None in previous 6 months  Medications: Outpatient Encounter  Medications as of 03/17/2023  Medication Sig   amLODipine (NORVASC) 10 MG tablet TAKE ONE TABLET BY MOUTH ONCE DAILY Needs annual physical appt for further refills   atorvastatin (LIPITOR) 20 MG tablet TAKE ONE TABLET BY MOUTH ONCE DAILY   cetirizine (ZYRTEC) 10 MG tablet Take 1 tablet (10 mg total) by mouth daily.   Cholecalciferol (VITAMIN D3) 25 MCG (1000 UT) CAPS Take 2 capsules (2,000 Units total) by mouth daily.   clonazePAM (KLONOPIN) 0.5 MG tablet TAKE ONE TABLET BY MOUTH EVERYDAY AT BEDTIME   cyanocobalamin (V-R VITAMIN B-12) 500 MCG tablet Take 1 tablet (500 mcg total) by mouth every Monday, Wednesday, and Friday.   fluticasone (FLONASE) 50 MCG/ACT nasal spray Place 2 sprays into both nostrils daily.   gabapentin (NEURONTIN) 100 MG capsule Take 1 capsule (100 mg total) by mouth 2 (two) times daily as needed (sedation precautions).   levothyroxine (SYNTHROID) 75 MCG tablet TAKE ONE TABLET BY MOUTH EVERY MORNING   omeprazole (PRILOSEC) 40 MG capsule TAKE ONE CAPSULE BY MOUTH EVERY MORNING   predniSONE (DELTASONE) 10 MG tablet Take 4 pills once daily by mouth for 3 days, then 3 pills daily for 3 days, then 2 pills daily for 3 days then 1 pill daily for 3 days then stop   sertraline (ZOLOFT) 100 MG tablet TAKE ONE TABLET BY MOUTH EVERY MORNING   sertraline (ZOLOFT) 50 MG tablet TAKE ONE TABLET BY MOUTH EVERY MORNING   No facility-administered encounter medications on file as of 03/17/2023.   BP Readings from Last  3 Encounters:  03/08/23 132/64  03/01/23 116/62  11/25/22 138/70    Pulse Readings from Last 3 Encounters:  03/08/23 94  03/01/23 68  11/25/22 72    Lab Results  Component Value Date/Time   HGBA1C 6.2 11/18/2022 07:58 AM   HGBA1C 6.3 08/13/2021 09:35 AM   Lab Results  Component Value Date   CREATININE 1.02 03/01/2023   BUN 17 03/01/2023   GFR 54.22 (L) 03/01/2023   NA 137 03/01/2023   K 3.8 03/01/2023   CALCIUM 8.9 03/01/2023   CO2 28 03/01/2023   Al Corpus, CPP notified   Claudina Lick, [place] Clinical Pharmacy Assistant [phone]

## 2023-03-17 NOTE — Telephone Encounter (Signed)
Name of Medication: Clonazepam Name of Pharmacy: [facility] Last Fill or Written Date and Quantity: 02/25/23, #30 Last Office Visit and Type: 11/25/22, AWV Next Office Visit and Type: 03/26/23, discuss kidneys/labs Last Controlled Substance Agreement Date: 06/08/16 Last UDS: 06/08/16

## 2023-03-19 NOTE — Telephone Encounter (Signed)
ERx 

## 2023-03-26 ENCOUNTER — Ambulatory Visit: Payer: Medicare Other | Admitting: Family Medicine

## 2023-04-08 ENCOUNTER — Encounter: Payer: Self-pay | Admitting: Nurse Practitioner

## 2023-04-08 ENCOUNTER — Ambulatory Visit (INDEPENDENT_AMBULATORY_CARE_PROVIDER_SITE_OTHER): Payer: Medicare Other | Admitting: Nurse Practitioner

## 2023-04-08 VITALS — BP 122/62 | HR 78 | Temp 98.4°F | Resp 16 | Ht 61.5 in | Wt 161.1 lb

## 2023-04-08 DIAGNOSIS — I83813 Varicose veins of bilateral lower extremities with pain: Secondary | ICD-10-CM

## 2023-04-08 NOTE — Patient Instructions (Signed)
Nice to see you today Try and keep your legs elevated the best you can You can get a compression garment in the store or online Follow up with Dr. Sharen Hones if no improvement

## 2023-04-08 NOTE — Progress Notes (Signed)
Acute Office Visit  Subjective:     Patient ID: Donna Vargas, female    DOB: 1946-05-25, 77 y.o.   MRN: [id number]  Chief Complaint  Patient presents with   Leg Swelling    Both legs but right one is worse. States that they feel heavy. Redness X 2 days    HPI Patient is in today for leg pain with a history of vaircose veins, htn, carotis stenosis, hypothyroidism , chronic fatigue  Patient is not on any exogenous hormones, does not and never has smoked  Symptoms started approx 2-3 days. State that her right one is worse than the left. States that the swelling does go down some. States that she does not have a history of blood clot.  Mother had a history of a supefical thrombus Tender to the touch . No travel or recent surgery.  States that the legs feel heavy.  Patient does have a history of lower back problems that does create some numbness down the legs but this is nothing new.   Review of Systems  Constitutional:  Negative for chills and fever.  Respiratory:  Negative for shortness of breath.   Cardiovascular:  Positive for leg swelling. Negative for chest pain.  Neurological:  Positive for tingling and weakness.        Objective:    BP 122/62   Pulse 78   Temp 98.4 F (36.9 C)   Resp 16   Ht 5' 1.5" (1.562 m)   Wt 161 lb 2 oz (73.1 kg)   SpO2 97%   BMI 29.95 kg/m  BP Readings from Last 3 Encounters:  04/08/23 122/62  03/08/23 132/64  03/01/23 116/62   Wt Readings from Last 3 Encounters:  04/08/23 161 lb 2 oz (73.1 kg)  03/08/23 155 lb 6 oz (70.5 kg)  03/01/23 157 lb 6 oz (71.4 kg)      Physical Exam Vitals and nursing note reviewed.  Constitutional:      Appearance: Normal appearance.  Cardiovascular:     Rate and Rhythm: Normal rate and regular rhythm.     Pulses:          Dorsalis pedis pulses are 2+ on the right side and 2+ on the left side.       Posterior tibial pulses are 2+ on the right side and 2+ on the left side.     Heart  sounds: Normal heart sounds.     Comments: Large varicose veins on the right upper part of the lower extremity  Spider veins on bilateral lower extremities  Pulmonary:     Effort: Pulmonary effort is normal.     Breath sounds: Normal breath sounds.  Musculoskeletal:     Right lower leg: No edema.     Left lower leg: No edema.       Legs:     Comments: 36.5 right calf 35 left calf  Bilateral calves supple and nontender to palpation.  Negative Homans' sign bilateral sides  Neurological:     Mental Status: She is alert.     No results found for any visits on 04/08/23.      Assessment & Plan:   Problem List Items Addressed This Visit       Cardiovascular and Mediastinum   Varicose veins of bilateral lower extremities with pain - Primary    Pain and swelling with varicose veins.  Instructed patient to rest and elevate legs is much as she can she can or wear compression  stockings/garment.  Ambulatory referral to vascular.  Patient has a history of having varicose veins treated with a procedure in the past.      Relevant Orders   Ambulatory referral to Vascular Surgery    No orders of the defined types were placed in this encounter.   Return if symptoms worsen or fail to improve.  Audria Nine, NP

## 2023-04-08 NOTE — Assessment & Plan Note (Signed)
Pain and swelling with varicose veins.  Instructed patient to rest and elevate legs is much as she can she can or wear compression stockings/garment.  Ambulatory referral to vascular.  Patient has a history of having varicose veins treated with a procedure in the past.

## 2023-04-13 DIAGNOSIS — M5451 Vertebrogenic low back pain: Secondary | ICD-10-CM | POA: Diagnosis not present

## 2023-04-13 DIAGNOSIS — M5136 Other intervertebral disc degeneration, lumbar region: Secondary | ICD-10-CM | POA: Diagnosis not present

## 2023-04-16 ENCOUNTER — Telehealth: Payer: Self-pay

## 2023-04-16 NOTE — Progress Notes (Unsigned)
Care Management & Coordination Services Pharmacy Team  Reason for Encounter: Medication Coordination and Delivery  Contacted patient to discuss medications and coordinate delivery from [facility].  {US HC Outreach:28874}  Cycle dispensing form sent to {Blank multiple:19196::"Lindsay Foltanski, PharmD","Lindsey Saintsing, CTL"} for review.   Last adherence delivery date: 03/30/2023      Patient is due for next adherence delivery on: 04/28/2023  This delivery to include: Adherence Packaging  30 Days  Packs: Clonazepam 0.5mg  - 1 tablet bedtime  Cetrizine 10 mg - 1 tablet at bedtime Vitamin D3 2000 IU - 1 tablet breakfast Vitamin B12 500 mcg - 1 tablet breakfast Sertraline 50 mg - take 1 tablet at breakfast Omeprazole 40 mg - Take 1 capsule at breakfast Sertraline 100 mg - take 1.5 tablet at breakfast Amlodipine 10 mg - 1 tablet breakfast  Levothyroxine 75 mcg - 1 tablet daily breakfast Atorvastatin 20 mg - 1 tablet daily at breakfast   Patient declined the following medications this month: ***  Refills requested from providers include: Complete Clonazepam 0.5mg  - 1 tablet bedtime   {Delivery ZOXW:96045} 04/28/2023  Any concerns about your medications? {yes/no:20286}  How often do you forget or accidentally miss a dose? {Missed doses:25554}  Do you use a pillbox? No  Is patient in packaging {yes/no:20286}  If yes  What is the date on your next pill pack?  Any concerns or issues with your packaging?  Recent blood pressure readings are as follows: Patient does not check it at home.     Chart review: Recent office visits:  04/08/23 Mordecai Maes, NP Varicose Veins of bilateral lower extremities with pain Referral to Vascular Surgery No med changes No F/U  Recent consult visits:  None since last contact  Hospital visits:  None in previous 6 months  Medications: Outpatient Encounter Medications as of 04/16/2023  Medication Sig   amLODipine (NORVASC) 10 MG  tablet TAKE ONE TABLET BY MOUTH ONCE DAILY Needs annual physical appt for further refills   atorvastatin (LIPITOR) 20 MG tablet TAKE ONE TABLET BY MOUTH ONCE DAILY   cetirizine (ZYRTEC) 10 MG tablet Take 1 tablet (10 mg total) by mouth daily.   Cholecalciferol (VITAMIN D3) 25 MCG (1000 UT) CAPS Take 2 capsules (2,000 Units total) by mouth daily.   clonazePAM (KLONOPIN) 0.5 MG tablet TAKE ONE TABLET BY MOUTH EVERYDAY AT BEDTIME   cyanocobalamin (V-R VITAMIN B-12) 500 MCG tablet Take 1 tablet (500 mcg total) by mouth every Monday, Wednesday, and Friday.   fluticasone (FLONASE) 50 MCG/ACT nasal spray Place 2 sprays into both nostrils daily.   gabapentin (NEURONTIN) 100 MG capsule Take 1 capsule (100 mg total) by mouth 2 (two) times daily as needed (sedation precautions). (Patient not taking: Reported on 04/08/2023)   levothyroxine (SYNTHROID) 75 MCG tablet TAKE ONE TABLET BY MOUTH EVERY MORNING   omeprazole (PRILOSEC) 40 MG capsule TAKE ONE CAPSULE BY MOUTH EVERY MORNING   predniSONE (DELTASONE) 10 MG tablet Take 4 pills once daily by mouth for 3 days, then 3 pills daily for 3 days, then 2 pills daily for 3 days then 1 pill daily for 3 days then stop (Patient not taking: Reported on 04/08/2023)   sertraline (ZOLOFT) 100 MG tablet TAKE ONE TABLET BY MOUTH EVERY MORNING   sertraline (ZOLOFT) 50 MG tablet TAKE ONE TABLET BY MOUTH EVERY MORNING   traMADol (ULTRAM) 50 MG tablet Take 50 mg by mouth as needed. (Patient not taking: Reported on 04/08/2023)   No facility-administered encounter medications on file  as of 04/16/2023.   BP Readings from Last 3 Encounters:  04/08/23 122/62  03/08/23 132/64  03/01/23 116/62    Pulse Readings from Last 3 Encounters:  04/08/23 78  03/08/23 94  03/01/23 68    Lab Results  Component Value Date/Time   HGBA1C 6.2 11/18/2022 07:58 AM   HGBA1C 6.3 08/13/2021 09:35 AM   Lab Results  Component Value Date   CREATININE 1.02 03/01/2023   BUN 17 03/01/2023   GFR 54.22  (L) 03/01/2023   NA 137 03/01/2023   K 3.8 03/01/2023   CALCIUM 8.9 03/01/2023   CO2 28 03/01/2023   Al Corpus, CPP notified   Claudina Lick, [place] Clinical Pharmacy Assistant [phone]

## 2023-04-21 ENCOUNTER — Telehealth (INDEPENDENT_AMBULATORY_CARE_PROVIDER_SITE_OTHER): Payer: Self-pay

## 2023-04-21 NOTE — Telephone Encounter (Signed)
Patient called about moving her appointment up from June 7th to whatever you have soon. She says her legs are in pain and she would like to be seen.

## 2023-04-22 ENCOUNTER — Telehealth: Payer: Self-pay | Admitting: Family Medicine

## 2023-04-22 NOTE — Telephone Encounter (Signed)
Patient contacted the office regarding referral sent in for vascular/ vein specialist. Patient states she is not able to get in with their office until 6/7 and says her veins in her legs are getting worse. Patient was wondering if she could get a referral sent in for somewhere else in [place] area that may be able to get her in sooner. States he has also had trouble with getting in contact with front office/ scheduling staff to see if there is earlier availability. Please advise, thank you. Sent to provider who placed referral and pcp.

## 2023-04-22 NOTE — Telephone Encounter (Signed)
Called and informed pt of this information. She said she would call them to see if they have a sooner appt.

## 2023-04-22 NOTE — Telephone Encounter (Signed)
June 7th is fairly quick for a specialist. They were referred to [facility] in [place]. They can call the [facility] clinic and see if they offer the specialty and the time until a new patient appointment

## 2023-04-23 ENCOUNTER — Emergency Department (HOSPITAL_BASED_OUTPATIENT_CLINIC_OR_DEPARTMENT_OTHER): Payer: Medicare Other

## 2023-04-23 ENCOUNTER — Emergency Department (HOSPITAL_BASED_OUTPATIENT_CLINIC_OR_DEPARTMENT_OTHER)
Admission: EM | Admit: 2023-04-23 | Discharge: 2023-04-23 | Disposition: A | Discharge status: Home / Self Care | Payer: Medicare Other | Attending: Emergency Medicine | Admitting: Emergency Medicine

## 2023-04-23 ENCOUNTER — Other Ambulatory Visit: Payer: Self-pay

## 2023-04-23 ENCOUNTER — Encounter (HOSPITAL_BASED_OUTPATIENT_CLINIC_OR_DEPARTMENT_OTHER): Payer: Self-pay | Admitting: Emergency Medicine

## 2023-04-23 DIAGNOSIS — M7989 Other specified soft tissue disorders: Secondary | ICD-10-CM | POA: Diagnosis not present

## 2023-04-23 DIAGNOSIS — R600 Localized edema: Secondary | ICD-10-CM | POA: Diagnosis not present

## 2023-04-23 DIAGNOSIS — M79605 Pain in left leg: Secondary | ICD-10-CM | POA: Diagnosis not present

## 2023-04-23 DIAGNOSIS — I8001 Phlebitis and thrombophlebitis of superficial vessels of right lower extremity: Secondary | ICD-10-CM | POA: Diagnosis not present

## 2023-04-23 DIAGNOSIS — M79604 Pain in right leg: Secondary | ICD-10-CM | POA: Diagnosis not present

## 2023-04-23 NOTE — ED Provider Notes (Signed)
[facility] Provider Note   CSN: [id number] Arrival date & time: 04/23/23  1015     History  Chief Complaint  Patient presents with   Leg Swelling    right    Donna Vargas is a 77 y.o. female.  Patient with history of high blood pressure --presents to the emergency department for leg swelling.  She has had a history of varicose veins.  She has noted increased swelling and generalized aching pain of her bilateral extremities over the past several weeks.  She has follow-up with vascular surgery on June 7.  She has noted worsening pain on the right leg especially around the ankle and left leg around the ankle.  No falls or injuries.  She denies claudication symptoms.  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  She elevates when possible.  No recent travel or surgeries.  No history of blood clots or anticoagulation.        Home Medications Prior to Admission medications   Medication Sig Start Date End Date Taking? Authorizing Provider  amLODipine (NORVASC) 10 MG tablet TAKE ONE TABLET BY MOUTH ONCE DAILY Needs annual physical appt for further refills 12/18/22   Eustaquio Boyden, MD  atorvastatin (LIPITOR) 20 MG tablet TAKE ONE TABLET BY MOUTH ONCE DAILY 02/16/23   Eustaquio Boyden, MD  cetirizine (ZYRTEC) 10 MG tablet Take 1 tablet (10 mg total) by mouth daily. 03/01/23   Eden Emms, NP  Cholecalciferol (VITAMIN D3) 25 MCG (1000 UT) CAPS Take 2 capsules (2,000 Units total) by mouth daily. 02/17/21   Eustaquio Boyden, MD  clonazePAM (KLONOPIN) 0.5 MG tablet TAKE ONE TABLET BY MOUTH EVERYDAY AT BEDTIME 03/19/23   Eustaquio Boyden, MD  cyanocobalamin (V-R VITAMIN B-12) 500 MCG tablet Take 1 tablet (500 mcg total) by mouth every Monday, Wednesday, and Friday. 11/25/22   Eustaquio Boyden, MD  fluticasone [facility]) 50 MCG/ACT nasal spray Place 2 sprays into both nostrils daily. 03/08/23   Tower, Audrie Gallus, MD   gabapentin (NEURONTIN) 100 MG capsule Take 1 capsule (100 mg total) by mouth 2 (two) times daily as needed (sedation precautions). Patient not taking: Reported on 04/08/2023 11/25/22   Eustaquio Boyden, MD  levothyroxine (SYNTHROID) 75 MCG tablet TAKE ONE TABLET BY MOUTH EVERY MORNING 02/16/23   Eustaquio Boyden, MD  omeprazole (PRILOSEC) 40 MG capsule TAKE ONE CAPSULE BY MOUTH EVERY MORNING 12/16/22   Eustaquio Boyden, MD  predniSONE (DELTASONE) 10 MG tablet Take 4 pills once daily by mouth for 3 days, then 3 pills daily for 3 days, then 2 pills daily for 3 days then 1 pill daily for 3 days then stop Patient not taking: Reported on 04/08/2023 03/08/23   Judy Pimple, MD  sertraline (ZOLOFT) 100 MG tablet TAKE ONE TABLET BY MOUTH EVERY MORNING 12/18/22   Eustaquio Boyden, MD  sertraline (ZOLOFT) 50 MG tablet TAKE ONE TABLET BY MOUTH EVERY MORNING 12/18/22   Eustaquio Boyden, MD  traMADol (ULTRAM) 50 MG tablet Take 50 mg by mouth as needed. Patient not taking: Reported on 04/08/2023    [provider]      Allergies    Hctz [hydrochlorothiazide] and Sulfa antibiotics    Review of Systems   Review of Systems  Physical Exam Updated Vital Signs BP 131/61   Pulse 75   Temp 98.4 F (36.9 C) (Oral)   Resp 16   Wt 73 kg   SpO2  96%   BMI 29.92 kg/m  Physical Exam Vitals and nursing note reviewed.  Constitutional:      Appearance: She is well-developed.  HENT:     Head: Normocephalic and atraumatic.  Eyes:     Conjunctiva/sclera: Conjunctivae normal.  Pulmonary:     Effort: No respiratory distress.  Musculoskeletal:        General: Tenderness present.     Cervical back: Normal range of motion and neck supple.     Right lower leg: Edema present.     Left lower leg: Edema present.     Comments: Trace bilateral lower extremity edema.  Patient with varicose veins noted in the ankles, calves, and distal thighs.  No focal areas of erythema or tenderness.  No bleeding.  No signs of  cellulitis.  Readily palpable 2+ pedal pulses bilaterally.   Skin:    General: Skin is warm and dry.  Neurological:     Mental Status: She is alert.     ED Results / Procedures / Treatments   Labs (all labs ordered are listed, but only abnormal results are displayed) Labs Reviewed - No data to display  EKG None  Radiology No results found.  Procedures Procedures    Medications Ordered in ED Medications - No data to display  ED Course/ Medical Decision Making/ A&P    Patient seen and examined. History obtained directly from patient.   Labs/EKG: None ordered  Imaging: Ordered bilateral dvt study  Medications/Fluids: None ordered  Most recent vital signs reviewed and are as follows: BP 131/61   Pulse 75   Temp 98.4 F (36.9 C) (Oral)   Resp 16   Wt 73 kg   SpO2 96%   BMI 29.92 kg/m   Initial impression: leg pain, venous insufficiency.   2:46 PM Reassessment performed. Patient appears stable.  Imaging results reviewed including: Lower extremity Doppler ultrasounds, and area of right thigh thrombophlebitis, otherwise negative for DVT  Reviewed pertinent lab work and imaging with patient at bedside. Questions answered.   Most current vital signs reviewed and are as follows: BP 131/61   Pulse 75   Temp 98.4 F (36.9 C) (Oral)   Resp 16   Wt 73 kg   SpO2 96%   BMI 29.92 kg/m   Plan: Discharge.    Other home care instructions discussed: Warm compresses, NSAIDs available for thrombophlebitis.  We discussed treatment allergies for venous insufficiency.  ED return instructions discussed: Return with worsening pain, new or worsening symptoms.  Follow-up instructions discussed: Patient encouraged to follow-up with vascular surgery as planned.  Click here for ABCD2, HEART and other calculatorsREFRESH Note before signing :1}                          Medical Decision Making  Patient with bilateral lower extremity edema and varicosities.  Patient does  not have a history of liver disease, kidney disease, heart failure.  She was evaluated for DVT today and studies were negative.  She has normal pedal pulses and overall symptoms are not consistent with arterial insufficiency.  Suspect venous insufficiency.  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  The patient's vital signs, pertinent lab work and imaging were reviewed and interpreted as discussed in the ED course. Hospitalization was considered for further testing, treatments, or serial exams/observation. However as patient is well-appearing, has a stable exam, and reassuring studies today, I do not feel  that they warrant admission at this time. This plan was discussed with the patient who verbalizes agreement and comfort with this plan and seems reliable and able to return to the Emergency Department with worsening or changing symptoms.          Final Clinical Impression(s) / ED Diagnoses Final diagnoses:  Thrombophlebitis of superficial veins of right lower extremity  Bilateral lower extremity edema    Rx / DC Orders ED Discharge Orders     None         Renne Crigler, PA-C 04/23/23 1449    Horton, Clabe Seal, DO 04/24/23 [phone]

## 2023-04-23 NOTE — ED Triage Notes (Signed)
Bilateral leg swelling , right leg is worse she said . Also intermittent pain , multiple varices .  Has specialist appointment in June , unable to wait .  Denies shortness of breath .

## 2023-04-23 NOTE — Discharge Instructions (Signed)
The ultrasound today was negative for blood clot in your legs.  You do have an inflamed superficial blood vessel on the right side.  You may consider using warm compresses and taking over-the-counter anti-inflammatories for this.  As we discussed, please try to avoid excessive salt, elevate your legs when possible, and consider using compression hose that is appropriately fitted to help with the lower extremity swelling.  Please follow-up with the vascular surgeon as planned in a couple of weeks.

## 2023-04-29 DIAGNOSIS — M47816 Spondylosis without myelopathy or radiculopathy, lumbar region: Secondary | ICD-10-CM | POA: Diagnosis not present

## 2023-05-05 ENCOUNTER — Other Ambulatory Visit (INDEPENDENT_AMBULATORY_CARE_PROVIDER_SITE_OTHER): Payer: Self-pay | Admitting: Nurse Practitioner

## 2023-05-05 DIAGNOSIS — I83813 Varicose veins of bilateral lower extremities with pain: Secondary | ICD-10-CM

## 2023-05-07 ENCOUNTER — Other Ambulatory Visit (INDEPENDENT_AMBULATORY_CARE_PROVIDER_SITE_OTHER): Payer: Self-pay | Admitting: Nurse Practitioner

## 2023-05-07 ENCOUNTER — Ambulatory Visit (INDEPENDENT_AMBULATORY_CARE_PROVIDER_SITE_OTHER): Payer: Medicare Other | Admitting: Nurse Practitioner

## 2023-05-07 ENCOUNTER — Ambulatory Visit (INDEPENDENT_AMBULATORY_CARE_PROVIDER_SITE_OTHER): Payer: Medicare Other

## 2023-05-07 VITALS — BP 118/64 | HR 72 | Resp 17 | Ht 62.0 in | Wt 159.2 lb

## 2023-05-07 DIAGNOSIS — I10 Essential (primary) hypertension: Secondary | ICD-10-CM | POA: Diagnosis not present

## 2023-05-07 DIAGNOSIS — E785 Hyperlipidemia, unspecified: Secondary | ICD-10-CM | POA: Diagnosis not present

## 2023-05-07 DIAGNOSIS — I83813 Varicose veins of bilateral lower extremities with pain: Secondary | ICD-10-CM

## 2023-05-08 ENCOUNTER — Encounter (INDEPENDENT_AMBULATORY_CARE_PROVIDER_SITE_OTHER): Payer: Self-pay | Admitting: Nurse Practitioner

## 2023-05-08 NOTE — Progress Notes (Signed)
Subjective:    Patient ID: Donna Vargas, female    DOB: 09-21-1946, 77 y.o.   MRN: [id number] Chief Complaint  Patient presents with   Establish Care    Donna Vargas is a 77 year old female who presents today for evaluation of bilateral varicosities.  She notes that she has previously had multiple treatments on her veins in the past.  She is not sure the exact name of the procedure but it sounds like an endovenous laser ablation.  The patient note significant improvement in the lower extremity pain but not resolution of the symptoms. The patient notes multiple residual varicosities bilaterally which continued to hurt with dependent positions and remained tender to palpation. The patient's swelling is minimally from preoperative status. The patient continues to wear graduated compression stockings on a daily basis but these are not eliminating the pain and discomfort. The patient continues to use over-the-counter anti-inflammatory medications to treat the pain and related symptoms but this has not given the patient relief. The patient notes the pain in the lower extremities is causing problems with daily exercise, problems at work and even with household activities such as preparing meals and doing dishes.  The patient is otherwise done well and there have been no complications related to the laser procedure or interval changes in the patient's overall   Today the patient has evidence of previous ablation of the right great saphenous vein.  The left has a very small diameter which may be indicative of previous ablation.  There is venous insufficiency on the right.  No evidence of DVT or superficial phlebitis bilaterally.     Review of Systems  All other systems reviewed and are negative.      Objective:   Physical Exam Vitals reviewed.  HENT:     Head: Normocephalic.  Cardiovascular:     Rate and Rhythm: Normal rate.     Pulses: Normal pulses.  Pulmonary:     Effort:  Pulmonary effort is normal.  Musculoskeletal:        General: Tenderness present.  Skin:    General: Skin is warm and dry.  Neurological:     Mental Status: She is alert and oriented to person, place, and time.  Psychiatric:        Mood and Affect: Mood normal.        Behavior: Behavior normal.        Thought Content: Thought content normal.        Judgment: Judgment normal.     BP 118/64 (BP Location: Left Arm)   Pulse 72   Resp 17   Ht 5\' 2"  (1.575 m)   Wt 159 lb 3.2 oz (72.2 kg)   BMI 29.12 kg/m   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   History of asthma 1990s   with bronchitis   History of diverticulitis    ?IBS   Hypertension    Hypothyroidism 2015   ?by prior PCP   Osteopenia 10/19/2019   DEXA 10/2019 T -1.2 hip (osteopenia)   Perennial allergic rhinitis    allergy shots (sharma)   Prediabetes 06/2014   Right thyroid nodule 09/11/2014   S/p biopsy showing benign colloid nodule (08/2014)    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    Social History   Socioeconomic History   Marital status: Married    Spouse name: Not on file   Number of children: Not on file   Years of education: Not on file   Highest education level: 12th grade  Occupational History   Not on file  Tobacco Use   Smoking status: Never   Smokeless tobacco: Never  Vaping Use   Vaping Use: Never used  Substance and Sexual Activity   Alcohol use: No    Alcohol/week: 0.0 standard drinks of alcohol   Drug use: No   Sexual activity: Yes  Other Topics Concern   Not on file  Social History Narrative   "Shasha"   Lives with husband, horse, donkeys, rabbits and goats   Family nearby   Occupation: retired, 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)   Edu: HS   Activity: no regular  exercise, yardwork   Diet: no water, some fruits/vegetables   Social Determinants of Health   Financial Resource Strain: Low Risk  (03/07/2023)   Overall Financial Resource Strain (CARDIA)    Difficulty of Paying Living Expenses: Not very hard  Food Insecurity: No Food Insecurity (03/07/2023)   Hunger Vital Sign    Worried About Running Out of Food in the Last Year: Never true    Ran Out of Food in the Last Year: Never true  Transportation Needs: No Transportation Needs (03/07/2023)   PRAPARE - Administrator, Civil Service (Medical): No    Lack of Transportation (Non-Medical): No  Physical Activity: Unknown (03/07/2023)   Exercise Vital Sign    Days of Exercise per Week: Patient declined    Minutes of Exercise per Session: Not on file  Stress: Stress Concern Present (03/07/2023)   [employer] of Occupational Health - Occupational Stress Questionnaire    Feeling of Stress : To some extent  Social Connections: Moderately Integrated (03/07/2023)   Social Connection and Isolation Panel [NHANES]    Frequency of Communication with Friends and Family: More than three times a week    Frequency of Social Gatherings with Friends and Family: Once a week    Attends Religious Services: More than 4 times per year    Active Member of [employer] or Organizations: No    Attends Banker Meetings: Not on file    Marital Status: Married  Catering manager Violence: Not At Risk (08/13/2020)   Humiliation, Afraid, Rape, and Kick questionnaire    Fear of Current or Ex-Partner: No    Emotionally Abused: No    Physically Abused: No    Sexually Abused: No    Past Surgical History:  Procedure Laterality Date   CATARACT EXTRACTION Left 03/31/2015   [place]   COLONOSCOPY  10/30/2008   WNL rpt 10 yrs Madilyn Fireman)   COLONOSCOPY  01/2019   WNL, diverticulosis, rpt 10 yrs ([facility])   ESOPHAGOGASTRODUODENOSCOPY  10/30/2008   gastritis, CLO neg Madilyn Fireman)   ESOPHAGOGASTRODUODENOSCOPY  01/2019    gastritis, o/w normal ([facility])   OOPHORECTOMY  1990s   growth on ovary - benign   TOTAL ABDOMINAL HYSTERECTOMY  11/30/1993   both ovaries removed. CA125 level elevated - but no cancer    Family History  Problem Relation Age of Onset   Cancer Mother        ovarian x2   Ovarian cancer Mother    CAD Father        unclear   Breast cancer Maternal Grandmother  CAD Maternal Grandfather    Breast cancer Cousin        maternal   Breast cancer Cousin        paternal   Hypertension Neg Hx    Diabetes Neg Hx     Allergies  Allergen Reactions   Hctz [Hydrochlorothiazide]     Blood came to the surface of her legs   Sulfa Antibiotics Rash       Latest Ref Rng & Units 03/01/2023    3:42 PM 11/18/2022    7:58 AM 06/12/2020   11:06 AM  CBC  WBC 4.0 - 10.5 K/uL 10.5  10.8  6.3   Hemoglobin 12.0 - 15.0 g/dL 52.8  41.3  24.4   Hematocrit 36.0 - 46.0 % 38.3  39.0  39.6   Platelets 150.0 - 400.0 K/uL 204.0  237.0  231.0       CMP     Component Value Date/Time   NA 137 03/01/2023 1542   K 3.8 03/01/2023 1542   CL 103 03/01/2023 1542   CO2 28 03/01/2023 1542   GLUCOSE 103 (H) 03/01/2023 1542   BUN 17 03/01/2023 1542   CREATININE 1.02 03/01/2023 1542   CALCIUM 8.9 03/01/2023 1542   PROT 7.1 11/18/2022 0758   ALBUMIN 4.2 11/18/2022 0758   AST 12 11/18/2022 0758   ALT 11 11/18/2022 0758   ALKPHOS 76 11/18/2022 0758   BILITOT 0.4 11/18/2022 0758   GFR 54.22 (L) 03/01/2023 1542     No results found.     Assessment & Plan:   1. Varicose veins of bilateral lower extremities with pain Recommend:  The patient has had successful ablation of the previously incompetent saphenous venous system but still has persistent symptoms of pain and swelling that are having a negative impact on daily life and daily activities.  Patient should undergo injection sclerotherapy to treat the residual varicosities.  The risks, benefits and alternative therapies were reviewed in detail with the  patient.  All questions were answered.  The patient agrees to proceed with sclerotherapy at their convenience.  The patient will continue wearing the graduated compression stockings and using the over-the-counter pain medications to treat her symptoms.      2. Primary hypertension Continue antihypertensive medications as already ordered, these medications have been reviewed and there are no changes at this time.  3. Hyperlipidemia, unspecified hyperlipidemia type Continue statin as ordered and reviewed, no changes at this time   Current Outpatient Medications on File Prior to Visit  Medication Sig Dispense Refill   amLODipine (NORVASC) 10 MG tablet TAKE ONE TABLET BY MOUTH ONCE DAILY Needs annual physical appt for further refills 90 tablet 2   atorvastatin (LIPITOR) 20 MG tablet TAKE ONE TABLET BY MOUTH ONCE DAILY 90 tablet 3   cetirizine (ZYRTEC) 10 MG tablet Take 1 tablet (10 mg total) by mouth daily. 30 tablet 11   Cholecalciferol (VITAMIN D3) 25 MCG (1000 UT) CAPS Take 2 capsules (2,000 Units total) by mouth daily.     clonazePAM (KLONOPIN) 0.5 MG tablet TAKE ONE TABLET BY MOUTH EVERYDAY AT BEDTIME 30 tablet 3   cyanocobalamin (V-R VITAMIN B-12) 500 MCG tablet Take 1 tablet (500 mcg total) by mouth every Monday, Wednesday, and Friday.     fluticasone (FLONASE) 50 MCG/ACT nasal spray Place 2 sprays into both nostrils daily. 16 g 2   levothyroxine (SYNTHROID) 75 MCG tablet TAKE ONE TABLET BY MOUTH EVERY MORNING 90 tablet 3   omeprazole (PRILOSEC) 40 MG capsule  TAKE ONE CAPSULE BY MOUTH EVERY MORNING 90 capsule 2   sertraline (ZOLOFT) 100 MG tablet TAKE ONE TABLET BY MOUTH EVERY MORNING 90 tablet 2   sertraline (ZOLOFT) 50 MG tablet TAKE ONE TABLET BY MOUTH EVERY MORNING 90 tablet 2   traMADol (ULTRAM) 50 MG tablet Take 1 tablet 3 times a day by oral route as needed for pain for 10 days.     No current facility-administered medications on file prior to visit.    There are no Patient  Instructions on file for this visit. No follow-ups on file.   Georgiana Spinner, NP

## 2023-05-26 ENCOUNTER — Encounter: Payer: Self-pay | Admitting: Family Medicine

## 2023-05-26 ENCOUNTER — Ambulatory Visit (INDEPENDENT_AMBULATORY_CARE_PROVIDER_SITE_OTHER): Payer: Medicare Other | Admitting: Family Medicine

## 2023-05-26 VITALS — BP 134/70 | HR 84 | Temp 97.3°F | Ht 62.0 in | Wt 157.0 lb

## 2023-05-26 DIAGNOSIS — R350 Frequency of micturition: Secondary | ICD-10-CM | POA: Diagnosis not present

## 2023-05-26 DIAGNOSIS — F331 Major depressive disorder, recurrent, moderate: Secondary | ICD-10-CM | POA: Diagnosis not present

## 2023-05-26 DIAGNOSIS — R140 Abdominal distension (gaseous): Secondary | ICD-10-CM

## 2023-05-26 DIAGNOSIS — N3946 Mixed incontinence: Secondary | ICD-10-CM | POA: Diagnosis not present

## 2023-05-26 DIAGNOSIS — K219 Gastro-esophageal reflux disease without esophagitis: Secondary | ICD-10-CM | POA: Diagnosis not present

## 2023-05-26 DIAGNOSIS — G8929 Other chronic pain: Secondary | ICD-10-CM

## 2023-05-26 DIAGNOSIS — I83813 Varicose veins of bilateral lower extremities with pain: Secondary | ICD-10-CM | POA: Diagnosis not present

## 2023-05-26 DIAGNOSIS — M5441 Lumbago with sciatica, right side: Secondary | ICD-10-CM | POA: Diagnosis not present

## 2023-05-26 DIAGNOSIS — I10 Essential (primary) hypertension: Secondary | ICD-10-CM

## 2023-05-26 DIAGNOSIS — M5442 Lumbago with sciatica, left side: Secondary | ICD-10-CM

## 2023-05-26 LAB — POC URINALSYSI DIPSTICK (AUTOMATED)
Blood, UA: NEGATIVE
Glucose, UA: NEGATIVE
Ketones, UA: NEGATIVE
Nitrite, UA: NEGATIVE
Protein, UA: POSITIVE — AB
Spec Grav, UA: 1.025 (ref 1.010–1.025)
Urobilinogen, UA: 0.2 E.U./dL
pH, UA: 6 (ref 5.0–8.0)

## 2023-05-26 MED ORDER — OXYBUTYNIN CHLORIDE ER 5 MG PO TB24: 5.0000 mg | ORAL_TABLET | Freq: Every day | ORAL | 3 refills | Status: DC

## 2023-05-26 NOTE — Patient Instructions (Addendum)
Schedule follow up appointment with Dr Ranell Patrick for worsening lower back pain and urinary incontinence.  Urine today looking ok - no signs of infection or blood.  For vein swelling leading to leg pain (chronic venous insufficiency), keep follow up with vein doctor, elevate legs, limit salt, drink plenty of water, and use compression stockings.   Decrease sertraline to 100mg  once daily.  Drop omeprazole 40mg  to every other day instead of daily - if worsening reflux/heartburn, go back to daily dosing.  May use Gas-X (simethicone) over the counter for gassiness/bloating. May restart oxybutynin XL 5mg  nightly for bladder symptoms.   Exclude gas producing foods (beans, onions, celery, carrots, raisins, bananas, apricots, prunes, brussel sprouts, wheat germ, pretzels) Trial of align probiotic for bloating/IBS symptoms (OTC).  Return in 1 month for follow up visit

## 2023-05-26 NOTE — Assessment & Plan Note (Signed)
Ongoing issue, worsening leg symptoms. Supportive measures reviewed - leg elevation, limiting salt, good hydration, compression stocking use.  She is already established with [facility], discussing sclerotherapy.  Consider dropping amlodipine dose.

## 2023-05-26 NOTE — Assessment & Plan Note (Signed)
Fastritis on latest EGD 2020 [facility]).  Has been on PPI daily but without significant GERD symptoms did recommend trial drop dose to every other day.

## 2023-05-26 NOTE — Assessment & Plan Note (Signed)
Symptoms have worsened - urge > stress incontinence.  UA/micro today overall reassuring.  Previously on Oxybutynin XL 5mg  nightly - unsure when this was stopped. Will restart. If ongoing or worsening urinary incontinence, did recommend she touch base with Dr Ranell Patrick about symptoms ensure not of lumbar etiology.

## 2023-05-26 NOTE — Progress Notes (Signed)
Ph: [phone] Fax: [phone]   Patient ID: Donna Vargas, female    DOB: 1946-08-10, 77 y.o.   MRN: [id number]  This visit was conducted in person.  BP 134/70 (BP Location: Right Arm, Cuff Size: Large)   Pulse 84   Temp (!) 97.3 F (36.3 C) (Temporal)   Ht 5\' 2"  (1.575 m)   Wt 157 lb (71.2 kg)   SpO2 97%   BMI 28.72 kg/m   BP Readings from Last 3 Encounters:  05/26/23 134/70  05/07/23 118/64  04/23/23 (!) 158/78   CC: urinary frequency, abd bloating, leg swelling Subjective:   HPI: Donna Vargas is a 77 y.o. female presenting on 05/26/2023 for Urinary Frequency (C/o having to urinate often. Started about 1 mo ago. Has to wear Depends. Also, c/o abd bloating and leg swelling.  )   1-2 month h/o worsening abdominal bloating and swelling, worsening urinary incontinence. Has started using depends diapers due to this.  No fevers/chills, nausea/vomiting or flank pain, dysuria, hematuria, significant nocturia,  No bowel incontinence. Normal bowel movements without diarrhea or constipation. No cramping.   Notes progressive leg swelling that starts proximal legs at thighs. No significant ankle swelling.  Notes legs feel numb - this may be coming from known lumbar disc disease. She is wearing her lumbar back brace. Has seen Dr Joie Bimler, and Ramos at [facility]). She continues tramadol for pain, now off gabapentin (not as effective).  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 Latest L5/S1 facet joint injections without significant benefit.   Saw [facility] remotely for varicose veins s/p previous saphenous vein ablation. Recently saw [facility] again - planning injection sclerotherapy as next step. Rec continue compression stocking use.   HTN - she continues amlodipine 10mg  daily.      Relevant past medical, surgical,  family and social history reviewed and updated as indicated. Interim medical history since our last visit reviewed. Allergies and medications reviewed and updated. Outpatient Medications Prior to Visit  Medication Sig Dispense Refill   amLODipine (NORVASC) 10 MG tablet TAKE ONE TABLET BY MOUTH ONCE DAILY Needs annual physical appt for further refills 90 tablet 2   atorvastatin (LIPITOR) 20 MG tablet TAKE ONE TABLET BY MOUTH ONCE DAILY 90 tablet 3   cetirizine (ZYRTEC) 10 MG tablet Take 1 tablet (10 mg total) by mouth daily. 30 tablet 11   Cholecalciferol (VITAMIN D3) 25 MCG (1000 UT) CAPS Take 2 capsules (2,000 Units total) by mouth daily.     clonazePAM (KLONOPIN) 0.5 MG tablet TAKE ONE TABLET BY MOUTH EVERYDAY AT BEDTIME 30 tablet 3   cyanocobalamin (V-R VITAMIN B-12) 500 MCG tablet Take 1 tablet (500 mcg total) by mouth every Monday, Wednesday, and Friday.     fluticasone (FLONASE) 50 MCG/ACT nasal spray Place 2 sprays into both nostrils daily. (Patient taking differently: Place 2 sprays into both nostrils daily. As needed) 16 g 2   levothyroxine (SYNTHROID) 75 MCG tablet TAKE ONE TABLET BY MOUTH EVERY MORNING 90 tablet 3   sertraline (ZOLOFT) 100 MG tablet TAKE ONE TABLET BY MOUTH EVERY MORNING 90 tablet 2   traMADol (ULTRAM) 50 MG tablet Take 1 tablet 3 times a day by oral route as needed for pain for 10 days.     sertraline (ZOLOFT) 50 MG tablet TAKE ONE TABLET BY MOUTH EVERY MORNING  90 tablet 2   omeprazole (PRILOSEC) 40 MG capsule Take 1 capsule (40 mg total) by mouth every other day.     omeprazole (PRILOSEC) 40 MG capsule TAKE ONE CAPSULE BY MOUTH EVERY MORNING (Patient not taking: Reported on 05/26/2023) 90 capsule 2   No facility-administered medications prior to visit.     Per HPI unless specifically indicated in ROS section below Review of Systems  Objective:  BP 134/70 (BP Location: Right Arm, Cuff Size: Large)   Pulse 84   Temp (!) 97.3 F (36.3 C) (Temporal)   Ht 5\' 2"   (1.575 m)   Wt 157 lb (71.2 kg)   SpO2 97%   BMI 28.72 kg/m   Wt Readings from Last 3 Encounters:  05/26/23 157 lb (71.2 kg)  05/07/23 159 lb 3.2 oz (72.2 kg)  04/23/23 160 lb 15 oz (73 kg)      Physical Exam Vitals and nursing note reviewed.  Constitutional:      Appearance: Normal appearance. She is not ill-appearing.  HENT:     Mouth/Throat:     Mouth: Mucous membranes are moist.     Pharynx: Oropharynx is clear. No oropharyngeal exudate or posterior oropharyngeal erythema.  Eyes:     Extraocular Movements: Extraocular movements intact.     Pupils: Pupils are equal, round, and reactive to light.  Abdominal:     General: Bowel sounds are normal. There is distension (mild).     Palpations: Abdomen is soft. There is no hepatomegaly, splenomegaly or mass.     Tenderness: There is generalized abdominal tenderness (mild). There is no right CVA tenderness, left CVA tenderness, guarding or rebound. Negative signs include Murphy's sign.     Hernia: No hernia is present.  Musculoskeletal:        General: Swelling and tenderness present.     Right lower leg: No edema.     Left lower leg: No edema.     Comments:  Dilated varicose veins bilaterally with smaller spider veins present 2+ DP bilaterally No pain midline spine No paraspinous mm tenderness Neg SLR bilaterally. No pain with int/ext rotation at hip. No pain at SIJ, GTB or sciatic notch bilaterally.   Skin:    General: Skin is warm and dry.     Findings: No rash.  Neurological:     Mental Status: She is alert.     Motor: Motor function is intact.     Coordination: Coordination is intact.     Deep Tendon Reflexes:     Reflex Scores:      Patellar reflexes are 2+ on the right side and 2+ on the left side.    Comments:  5/5 strength BLE Sensation intact  Psychiatric:        Mood and Affect: Mood normal.        Behavior: Behavior normal.       Results for orders placed or performed in visit on 05/26/23  POCT  Urinalysis Dipstick (Automated)  Result Value Ref Range   Color, UA yellow    Clarity, UA clear    Glucose, UA Negative Negative   Bilirubin, UA 1+    Ketones, UA negative    Spec Grav, UA 1.025 1.010 - 1.025   Blood, UA negative    pH, UA 6.0 5.0 - 8.0   Protein, UA Positive (A) Negative   Urobilinogen, UA 0.2 0.2 or 1.0 E.U./dL   Nitrite, UA negative    Leukocytes, UA Trace (A) Negative   Lab Results  Component Value Date   CREATININE 1.02 03/01/2023   BUN 17 03/01/2023   NA 137 03/01/2023   K 3.8 03/01/2023   CL 103 03/01/2023   CO2 28 03/01/2023    Lab Results  Component Value Date   ALT 11 11/18/2022   AST 12 11/18/2022   ALKPHOS 76 11/18/2022   BILITOT 0.4 11/18/2022    Lab Results  Component Value Date   WBC 10.5 03/01/2023   HGB 12.7 03/01/2023   HCT 38.3 03/01/2023   MCV 93.2 03/01/2023   PLT 204.0 03/01/2023   Lumbar MRI without contrast IMPRESSION 07/2019: 1. At L2-3 there is a broad-based disc bulge with a left paracentral/foraminal disc protrusion with mass effect on the left intraspinal L3 nerve root. Mild bilateral facet arthropathy. Mild left foraminal stenosis.     05/26/2023   12:42 PM 04/08/2023    2:27 PM 03/01/2023    3:06 PM 11/25/2022    1:32 PM 01/20/2022    9:33 AM  Depression screen PHQ 2/9  Decreased Interest 1 0 1 0 0  Down, Depressed, Hopeless 1 0 1 0 0  PHQ - 2 Score 2 0 2 0 0  Altered sleeping 0 0 0 0   Tired, decreased energy 1 2 3 1    Change in appetite 2 1 1  0   Feeling bad or failure about yourself  0 0 0 1   Trouble concentrating 0 0 0 0   Moving slowly or fidgety/restless 3 2 3 3    Suicidal thoughts 0 0 0 0   PHQ-9 Score 8 5 9 5    Difficult doing work/chores Not difficult at all Somewhat difficult Somewhat difficult Not difficult at all        05/26/2023   12:42 PM 04/08/2023    2:27 PM 03/01/2023    3:06 PM 11/25/2022    1:32 PM  GAD 7 : Generalized Anxiety Score  Nervous, Anxious, on Edge 2 2 2 3   Control/stop worrying  1 1 1 2   Worry too much - different things 1 1 1 1   Trouble relaxing 3 2 3 3   Restless 3 2 3 3   Easily annoyed or irritable 2 0 1 1  Afraid - awful might happen 0 0 0 0  Total GAD 7 Score 12 8 11 13   Anxiety Difficulty Somewhat difficult Somewhat difficult Somewhat difficult Not difficult at all   Assessment & Plan:   Problem List Items Addressed This Visit     MDD (major depressive disorder), recurrent episode, moderate (HCC)    Chronic, ongoing anxiety and restlessness without significant depressed mood.  Longstanding on sertraline 150mg  since 2020. Will drop dose to 100mg  daily to see if any improvement in endorsed restlessness.       Hypertension    Chronic, stable on amlodipine 10mg  daily.  No significant pedal edema on exam. Could consider dropping dose to see effect on leg symptoms.       Abdominal bloating - Primary    Suggested trial Align probiotic, GasX PRN OTC.  Drop omeprazole to 40mg  every other day.  Recent labs reassuring Update with effect.  She denies h/o or symptoms of lactose intolerance.  Recommend excluding gas producing foods for a limited trial.       Chronic bilateral low back pain with bilateral sciatica    Seeing ortho and spine clinic and [facility] Ranell Patrick, [place], [facility]).  Worsening urinary symptoms and ongoing lower back pain despite recent facet joint injections.  Encouraged return to  spine clinic for f/u.  Reassuring exam today.  She continues tramadol, tylenol, gabapentin PRN with benefit.       Mixed urge and stress incontinence    Symptoms have worsened - urge > stress incontinence.  UA/micro today overall reassuring.  Previously on Oxybutynin XL 5mg  nightly - unsure when this was stopped. Will restart. If ongoing or worsening urinary incontinence, did recommend she touch base with Dr Ranell Patrick about symptoms ensure not of lumbar etiology.       GERD (gastroesophageal reflux disease)    Fastritis on latest EGD 2020 ([facility]).  Has been on PPI  daily but without significant GERD symptoms did recommend trial drop dose to every other day.       Relevant Medications   omeprazole (PRILOSEC) 40 MG capsule   Varicose veins of bilateral lower extremities with pain    Ongoing issue, worsening leg symptoms. Supportive measures reviewed - leg elevation, limiting salt, good hydration, compression stocking use.  She is already established with [facility], discussing sclerotherapy.  Consider dropping amlodipine dose.       Other Visit Diagnoses     Urinary frequency       Relevant Orders   POCT Urinalysis Dipstick (Automated) (Completed)        Meds ordered this encounter  Medications   oxybutynin (DITROPAN-XL) 5 MG 24 hr tablet    Sig: Take 1 tablet (5 mg total) by mouth at bedtime.    Dispense:  30 tablet    Refill:  3    Orders Placed This Encounter  Procedures   POCT Urinalysis Dipstick (Automated)    Patient Instructions  Schedule follow up appointment with Dr Ranell Patrick for worsening lower back pain and urinary incontinence.  Urine today looking ok - no signs of infection or blood.  For vein swelling leading to leg pain (chronic venous insufficiency), keep follow up with vein doctor, elevate legs, limit salt, drink plenty of water, and use compression stockings.   Decrease sertraline to 100mg  once daily.  Drop omeprazole 40mg  to every other day instead of daily - if worsening reflux/heartburn, go back to daily dosing.  May use Gas-X (simethicone) over the counter for gassiness/bloating. May restart oxybutynin XL 5mg  nightly for bladder symptoms.   Exclude gas producing foods (beans, onions, celery, carrots, raisins, bananas, apricots, prunes, brussel sprouts, wheat germ, pretzels) Trial of align probiotic for bloating/IBS symptoms (OTC).  Return in 1 month for follow up visit   Follow up plan: Return in about 1 month (around 06/25/2023), or if symptoms worsen or fail to improve, for follow up visit.  Eustaquio Boyden, MD

## 2023-05-26 NOTE — Assessment & Plan Note (Addendum)
Seeing ortho and spine clinic and PM[facility] Ranell Patrick, [place], [place]).  Worsening urinary symptoms and ongoing lower back pain despite recent facet joint injections.  Encouraged return to spine clinic for f/u.  Reassuring exam today.  She continues tramadol, tylenol, gabapentin PRN with benefit.

## 2023-05-26 NOTE — Assessment & Plan Note (Signed)
Chronic, ongoing anxiety and restlessness without significant depressed mood.  Longstanding on sertraline 150mg  since 2020. Will drop dose to 100mg  daily to see if any improvement in endorsed restlessness.

## 2023-05-26 NOTE — Assessment & Plan Note (Addendum)
Suggested trial Align probiotic, GasX PRN OTC.  Drop omeprazole to 40mg  every other day.  Recent labs reassuring Update with effect.  She denies h/o or symptoms of lactose intolerance.  Recommend excluding gas producing foods for a limited trial.

## 2023-05-26 NOTE — Assessment & Plan Note (Signed)
Chronic, stable on amlodipine 10mg  daily.  No significant pedal edema on exam. Could consider dropping dose to see effect on leg symptoms.

## 2023-06-09 DIAGNOSIS — H52223 Regular astigmatism, bilateral: Secondary | ICD-10-CM | POA: Diagnosis not present

## 2023-06-09 DIAGNOSIS — H5213 Myopia, bilateral: Secondary | ICD-10-CM | POA: Diagnosis not present

## 2023-06-09 DIAGNOSIS — Z135 Encounter for screening for eye and ear disorders: Secondary | ICD-10-CM | POA: Diagnosis not present

## 2023-06-09 DIAGNOSIS — H524 Presbyopia: Secondary | ICD-10-CM | POA: Diagnosis not present

## 2023-06-09 DIAGNOSIS — Z961 Presence of intraocular lens: Secondary | ICD-10-CM | POA: Diagnosis not present

## 2023-06-25 ENCOUNTER — Encounter: Payer: Self-pay | Admitting: Family Medicine

## 2023-06-25 ENCOUNTER — Ambulatory Visit (INDEPENDENT_AMBULATORY_CARE_PROVIDER_SITE_OTHER): Payer: Medicare Other | Admitting: Family Medicine

## 2023-06-25 VITALS — BP 124/66 | HR 80 | Temp 97.3°F | Ht 62.0 in | Wt 149.1 lb

## 2023-06-25 DIAGNOSIS — K219 Gastro-esophageal reflux disease without esophagitis: Secondary | ICD-10-CM | POA: Diagnosis not present

## 2023-06-25 DIAGNOSIS — R140 Abdominal distension (gaseous): Secondary | ICD-10-CM | POA: Diagnosis not present

## 2023-06-25 DIAGNOSIS — M5126 Other intervertebral disc displacement, lumbar region: Secondary | ICD-10-CM | POA: Diagnosis not present

## 2023-06-25 DIAGNOSIS — F331 Major depressive disorder, recurrent, moderate: Secondary | ICD-10-CM | POA: Diagnosis not present

## 2023-06-25 DIAGNOSIS — M159 Polyosteoarthritis, unspecified: Secondary | ICD-10-CM

## 2023-06-25 DIAGNOSIS — R634 Abnormal weight loss: Secondary | ICD-10-CM

## 2023-06-25 DIAGNOSIS — N3946 Mixed incontinence: Secondary | ICD-10-CM | POA: Diagnosis not present

## 2023-06-25 NOTE — Assessment & Plan Note (Addendum)
Intentional weight loss discussed. No red flags.  Don't recommend much mor weight loss - she would like to drop to 140 lbs.

## 2023-06-25 NOTE — Assessment & Plan Note (Signed)
Restarted Oxybutynin XL 5mg  nightly with benefit.  She does note dry mouth side effect.

## 2023-06-25 NOTE — Assessment & Plan Note (Addendum)
Wearing lumbar support brace today.  Pending updated lumbar MRI next week with planned ortho f/u afterwards.

## 2023-06-25 NOTE — Assessment & Plan Note (Signed)
This has improved with recent med changes and healthy diet choices - sertraline dropped to 100mg  nignhty, omeprazole 40mg  dropped to every other day. Discussed PRN pepcid for breakthrough symptoms.

## 2023-06-25 NOTE — Progress Notes (Signed)
Ph: [phone] Fax: [phone]   Patient ID: Donna Vargas, female    DOB: 1946/09/20, 77 y.o.   MRN: [id number]  This visit was conducted in person.  BP 124/66   Pulse 80   Temp (!) 97.3 F (36.3 C) (Temporal)   Ht 5\' 2"  (1.575 m)   Wt 149 lb 2 oz (67.6 kg)   SpO2 99%   BMI 27.28 kg/m    CC: 1 mo f/u visit  Subjective:   HPI: Donna Vargas is a 77 y.o. female presenting on 06/25/2023 for Medical Management of Chronic Issues (Here for 1 mo f/u.)   See prior note for details.  Seen last month with urinary frequency and incontinence, abdominal bloating, progressive leg swelling with leg numbness in known lumbar disc disease followed by ortho Ranell Patrick, [place]) and PM&R. Workup revealed reassuring UA, labs.  We dropped sertraline from 150 to 100mg  to see if any improvement in restlessness.  Recommended trial align probiotic, gas X PRN, exclude gas producing foods, dropped omeprazole to 40mg  every other day. Reflux remains well controlled. She didn't try probiotic or gas x. For urinary symptoms, oxybutynin XL 5mg  nightly was restarted - urine symptoms are better on this.  Overall she feels well with this change.  Abdominal bloating has improved. Leg/ankle swelling is better.   No fevers/chills, pelvic pain, nausea/vomiting, diarrhea/constipation, early satiety, dysphagia.   Down another 8 lbs since last month - this has been intentional. Eating healthier - tomatoes, cucumbers, salads, pickles, tuna and dry toast.   Ongoing lower back pain L>R without significant sciatica - planned rpt MRI next week then to see Dr Glenford Bayley. She wears lumbar support brace.      Relevant past medical, surgical, family and social history reviewed and updated as indicated. Interim medical history since our last visit reviewed. Allergies and medications reviewed and updated. Outpatient Medications Prior to Visit  Medication Sig Dispense Refill   amLODipine (NORVASC) 10 MG  tablet TAKE ONE TABLET BY MOUTH ONCE DAILY Needs annual physical appt for further refills 90 tablet 2   atorvastatin (LIPITOR) 20 MG tablet TAKE ONE TABLET BY MOUTH ONCE DAILY 90 tablet 3   cetirizine (ZYRTEC) 10 MG tablet Take 1 tablet (10 mg total) by mouth daily. 30 tablet 11   Cholecalciferol (VITAMIN D3) 25 MCG (1000 UT) CAPS Take 2 capsules (2,000 Units total) by mouth daily.     clonazePAM (KLONOPIN) 0.5 MG tablet TAKE ONE TABLET BY MOUTH EVERYDAY AT BEDTIME 30 tablet 3   cyanocobalamin (V-R VITAMIN B-12) 500 MCG tablet Take 1 tablet (500 mcg total) by mouth every Monday, Wednesday, and Friday.     fluticasone (FLONASE) 50 MCG/ACT nasal spray Place 2 sprays into both nostrils daily. (Patient taking differently: Place 2 sprays into both nostrils daily. As needed) 16 g 2   levothyroxine (SYNTHROID) 75 MCG tablet TAKE ONE TABLET BY MOUTH EVERY MORNING 90 tablet 3   omeprazole (PRILOSEC) 40 MG capsule Take 1 capsule (40 mg total) by mouth every other day.     oxybutynin (DITROPAN-XL) 5 MG 24 hr tablet Take 1 tablet (5 mg total) by mouth at bedtime. 30 tablet 3   sertraline (ZOLOFT) 100 MG tablet TAKE ONE TABLET BY MOUTH EVERY MORNING 90 tablet 2   traMADol (ULTRAM) 50 MG tablet Take 1 tablet 3 times a day by oral route as needed for pain for 10 days.     No facility-administered medications prior to visit.     Per  HPI unless specifically indicated in ROS section below Review of Systems  Objective:  BP 124/66   Pulse 80   Temp (!) 97.3 F (36.3 C) (Temporal)   Ht 5\' 2"  (1.575 m)   Wt 149 lb 2 oz (67.6 kg)   SpO2 99%   BMI 27.28 kg/m   Wt Readings from Last 3 Encounters:  06/25/23 149 lb 2 oz (67.6 kg)  05/26/23 157 lb (71.2 kg)  05/07/23 159 lb 3.2 oz (72.2 kg)   10/2022 - 151 lbs   Physical Exam Vitals and nursing note reviewed.  Constitutional:      Appearance: Normal appearance. She is not ill-appearing.  HENT:     Head: Normocephalic and atraumatic.     Mouth/Throat:      Mouth: Mucous membranes are moist.     Pharynx: Oropharynx is clear. No oropharyngeal exudate or posterior oropharyngeal erythema.  Eyes:     Extraocular Movements: Extraocular movements intact.     Pupils: Pupils are equal, round, and reactive to light.  Neck:     Thyroid: Thyroid mass (chronic R thyroid nodule) present. No thyromegaly or thyroid tenderness.  Cardiovascular:     Rate and Rhythm: Normal rate and regular rhythm.     Pulses: Normal pulses.     Heart sounds: Normal heart sounds. No murmur heard. Pulmonary:     Effort: Pulmonary effort is normal. No respiratory distress.     Breath sounds: Normal breath sounds. No wheezing, rhonchi or rales.  Abdominal:     General: Bowel sounds are normal. There is no distension.     Palpations: Abdomen is soft. There is no mass.     Tenderness: There is no abdominal tenderness. There is no right CVA tenderness, left CVA tenderness, guarding or rebound.     Hernia: No hernia is present.  Musculoskeletal:     Cervical back: Normal range of motion and neck supple.     Right lower leg: No edema.     Left lower leg: No edema.  Skin:    General: Skin is warm and dry.     Findings: No rash.  Neurological:     Mental Status: She is alert.  Psychiatric:        Mood and Affect: Mood normal.        Behavior: Behavior normal.       Results for orders placed or performed in visit on 05/26/23  POCT Urinalysis Dipstick (Automated)  Result Value Ref Range   Color, UA yellow    Clarity, UA clear    Glucose, UA Negative Negative   Bilirubin, UA 1+    Ketones, UA negative    Spec Grav, UA 1.025 1.010 - 1.025   Blood, UA negative    pH, UA 6.0 5.0 - 8.0   Protein, UA Positive (A) Negative   Urobilinogen, UA 0.2 0.2 or 1.0 E.U./dL   Nitrite, UA negative    Leukocytes, UA Trace (A) Negative   Lab Results  Component Value Date   NA 137 03/01/2023   CL 103 03/01/2023   K 3.8 03/01/2023   CO2 28 03/01/2023   BUN 17 03/01/2023    CREATININE 1.02 03/01/2023   GFR 54.22 (L) 03/01/2023   CALCIUM 8.9 03/01/2023   ALBUMIN 4.2 11/18/2022   GLUCOSE 103 (H) 03/01/2023    Lab Results  Component Value Date   ALT 11 11/18/2022   AST 12 11/18/2022   ALKPHOS 76 11/18/2022   BILITOT 0.4 11/18/2022  Lab Results  Component Value Date   WBC 10.5 03/01/2023   HGB 12.7 03/01/2023   HCT 38.3 03/01/2023   MCV 93.2 03/01/2023   PLT 204.0 03/01/2023    Lab Results  Component Value Date   TSH 1.96 03/01/2023       06/25/2023   10:40 AM 05/26/2023   12:42 PM 04/08/2023    2:27 PM 03/01/2023    3:06 PM 11/25/2022    1:32 PM  Depression screen PHQ 2/9  Decreased Interest 1 1 0 1 0  Down, Depressed, Hopeless 1 1 0 1 0  PHQ - 2 Score 2 2 0 2 0  Altered sleeping 0 0 0 0 0  Tired, decreased energy 2 1 2 3 1   Change in appetite 0 2 1 1  0  Feeling bad or failure about yourself  2 0 0 0 1  Trouble concentrating 2 0 0 0 0  Moving slowly or fidgety/restless 0 3 2 3 3   Suicidal thoughts 0 0 0 0 0  PHQ-9 Score 8 8 5 9 5   Difficult doing work/chores Somewhat difficult Not difficult at all Somewhat difficult Somewhat difficult Not difficult at all       06/25/2023   10:40 AM 05/26/2023   12:42 PM 04/08/2023    2:27 PM 03/01/2023    3:06 PM  GAD 7 : Generalized Anxiety Score  Nervous, Anxious, on Edge 3 2 2 2   Control/stop worrying 1 1 1 1   Worry too much - different things 2 1 1 1   Trouble relaxing 3 3 2 3   Restless 3 3 2 3   Easily annoyed or irritable 2 2 0 1  Afraid - awful might happen 1 0 0 0  Total GAD 7 Score 15 12 8 11   Anxiety Difficulty Somewhat difficult Somewhat difficult Somewhat difficult Somewhat difficult    Assessment & Plan:   Problem List Items Addressed This Visit     MDD (major depressive disorder), recurrent episode, moderate (HCC)    Chronic, stable period on lower dose sertraline - this may have helped improve GI symptoms. Continue 100mg  daily.       Osteoarthritis    Appreciate ortho/PM&R care  pending lumbar MRI       Weight loss    Intentional weight loss discussed. No red flags.  Don't recommend much mor weight loss - she would like to drop to 140 lbs.       Abdominal bloating - Primary    This has improved with recent med changes and healthy diet choices - sertraline dropped to 100mg  nignhty, omeprazole 40mg  dropped to every other day. Discussed PRN pepcid for breakthrough symptoms.       Mixed urge and stress incontinence    Restarted Oxybutynin XL 5mg  nightly with benefit.  She does note dry mouth side effect.       GERD (gastroesophageal reflux disease)    Gastritis on latest EGD 2020. Stable period on lower PPI every other day dosing.  Discussed pepcid PRN breakthrough symptoms.       Lumbar herniated disc    Wearing lumbar support brace today.  Pending updated lumbar MRI next week with planned ortho f/u afterwards.         No orders of the defined types were placed in this encounter.   No orders of the defined types were placed in this encounter.   Patient Instructions  Continue current medicines - I'm glad you're doing well!  Continue omeprazole every other day,  if you have breakthrough heartburn, may take pepcid 20mg  over the counter.  Good to see you today Return to see me for physical in December.   Follow up plan: Return if symptoms worsen or fail to improve.  Eustaquio Boyden, MD

## 2023-06-25 NOTE — Assessment & Plan Note (Signed)
Chronic, stable period on lower dose sertraline - this may have helped improve GI symptoms. Continue 100mg  daily.

## 2023-06-25 NOTE — Assessment & Plan Note (Signed)
Appreciate ortho/PM&R care pending lumbar MRI

## 2023-06-25 NOTE — Assessment & Plan Note (Signed)
Gastritis on latest EGD 2020. Stable period on lower PPI every other day dosing.  Discussed pepcid PRN breakthrough symptoms.

## 2023-06-25 NOTE — Patient Instructions (Addendum)
Continue current medicines - I'm glad you're doing well!  Continue omeprazole every other day, if you have breakthrough heartburn, may take pepcid 20mg  over the counter.  Good to see you today Return to see me for physical in December.

## 2023-07-02 DIAGNOSIS — M5459 Other low back pain: Secondary | ICD-10-CM | POA: Diagnosis not present

## 2023-07-02 DIAGNOSIS — M5451 Vertebrogenic low back pain: Secondary | ICD-10-CM | POA: Diagnosis not present

## 2023-07-14 ENCOUNTER — Other Ambulatory Visit: Payer: Self-pay | Admitting: Family Medicine

## 2023-07-14 DIAGNOSIS — M5451 Vertebrogenic low back pain: Secondary | ICD-10-CM | POA: Diagnosis not present

## 2023-07-14 DIAGNOSIS — F331 Major depressive disorder, recurrent, moderate: Secondary | ICD-10-CM

## 2023-07-14 NOTE — Telephone Encounter (Signed)
Name of Medication:  Clonazepam Name of Pharmacy:  [facility] Last Fill or Written Date and Quantity:  06/24/23, #30 Last Office Visit and Type:  06/25/23, 1 mo f/u Next Office Visit and Type:  11/29/23, CPE Last Controlled Substance Agreement Date:  06/08/16 Last UDS:  06/08/16

## 2023-07-15 NOTE — Telephone Encounter (Signed)
ERx 

## 2023-07-20 DIAGNOSIS — M5416 Radiculopathy, lumbar region: Secondary | ICD-10-CM | POA: Diagnosis not present

## 2023-07-30 ENCOUNTER — Other Ambulatory Visit: Payer: Self-pay | Admitting: Family Medicine

## 2023-07-30 DIAGNOSIS — N3946 Mixed incontinence: Secondary | ICD-10-CM

## 2023-08-02 ENCOUNTER — Other Ambulatory Visit: Payer: Self-pay | Admitting: Family Medicine

## 2023-08-02 DIAGNOSIS — I10 Essential (primary) hypertension: Secondary | ICD-10-CM

## 2023-08-02 DIAGNOSIS — F331 Major depressive disorder, recurrent, moderate: Secondary | ICD-10-CM

## 2023-08-02 DIAGNOSIS — K219 Gastro-esophageal reflux disease without esophagitis: Secondary | ICD-10-CM

## 2023-08-04 NOTE — Telephone Encounter (Signed)
E-scribed new rxs to [facility] due to [facility] closing.

## 2023-08-09 ENCOUNTER — Encounter (INDEPENDENT_AMBULATORY_CARE_PROVIDER_SITE_OTHER): Payer: Self-pay | Admitting: Nurse Practitioner

## 2023-08-09 ENCOUNTER — Ambulatory Visit (INDEPENDENT_AMBULATORY_CARE_PROVIDER_SITE_OTHER): Payer: Medicare Other | Admitting: Nurse Practitioner

## 2023-08-09 VITALS — BP 116/66 | HR 68 | Resp 16 | Wt 144.0 lb

## 2023-08-09 DIAGNOSIS — I83813 Varicose veins of bilateral lower extremities with pain: Secondary | ICD-10-CM | POA: Diagnosis not present

## 2023-08-09 NOTE — Progress Notes (Signed)
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   Procedure: Sclerotherapy using hypertonic saline mixed with 1% Lidocaine was performed on the bilateral lower extremity. Compression wraps were placed. The patient tolerated the procedure well.

## 2023-08-18 ENCOUNTER — Ambulatory Visit: Payer: Medicare Other

## 2023-08-18 ENCOUNTER — Ambulatory Visit
Admission: RE | Admit: 2023-08-18 | Discharge: 2023-08-18 | Disposition: A | Discharge status: Home / Self Care | Payer: Medicare Other | Source: Ambulatory Visit | Attending: Emergency Medicine | Admitting: Emergency Medicine

## 2023-08-18 ENCOUNTER — Telehealth: Payer: Self-pay | Admitting: Emergency Medicine

## 2023-08-18 VITALS — BP 109/61 | HR 76 | Temp 98.8°F | Resp 16

## 2023-08-18 DIAGNOSIS — S46911A Strain of unspecified muscle, fascia and tendon at shoulder and upper arm level, right arm, initial encounter: Secondary | ICD-10-CM

## 2023-08-18 DIAGNOSIS — M19011 Primary osteoarthritis, right shoulder: Secondary | ICD-10-CM | POA: Diagnosis not present

## 2023-08-18 DIAGNOSIS — M25511 Pain in right shoulder: Secondary | ICD-10-CM | POA: Diagnosis not present

## 2023-08-18 MED ORDER — BACLOFEN 5 MG PO TABS: 5.0000 mg | ORAL_TABLET | Freq: Every evening | ORAL | 0 refills | Status: DC | PRN

## 2023-08-18 MED ORDER — KETOROLAC TROMETHAMINE 30 MG/ML IJ SOLN
30.0000 mg | Freq: Once | INTRAMUSCULAR | Status: AC
  Administered 2023-08-18: 30 mg via INTRAMUSCULAR

## 2023-08-18 MED ORDER — PREDNISONE 20 MG PO TABS: 40.0000 mg | ORAL_TABLET | Freq: Every day | ORAL | 0 refills | Status: DC

## 2023-08-18 NOTE — ED Triage Notes (Signed)
Pt c/o right shoulder pain for several weeks. Pt states her pain became worse last night with the pain radiating down her right arm. 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

## 2023-08-18 NOTE — Discharge Instructions (Signed)
Your pain is most likely caused by irritation to the muscles .  X-rays pending, you will be notified of results via telephone  You have been given an injection of Toradol which helps to reduce inflammation and helps with pain, daily will see improvement in 30 minutes to an hour  Starting tomorrow take prednisone every morning with food for 5 days to continue the above process, may use Tylenol in addition to this  May continue use of your tramadol as needed  May use baclofen which is a muscle relaxer at bedtime to allow you to rest, be mindful this will make you feel drowsy  You may use heating pad in 15 minute intervals as needed for additional comfort,or  you may find comfort in using ice in 10-15 minutes over affected area  Begin stretching affected area daily for 10 minutes as tolerated to further loosen muscles   When sitting and lying down place pillow underneath arm and behind back  If pain persist after recommended treatment or reoccurs if may be beneficial to follow up with orthopedic specialist for evaluation, this doctor specializes in the bones and can manage your symptoms long-term with options such as but not limited to imaging, medications or physical therapy

## 2023-08-18 NOTE — ED Provider Notes (Signed)
Renaldo Fiddler    CSN: [id number] Arrival date & time: 08/18/23  1055      History   Chief Complaint Chief Complaint  Patient presents with   Shoulder Pain    I can not do anything with my right arm. Need an xray - Entered by patient    HPI Donna Vargas is a 77 y.o. female.   Patient presents for evaluation of right-sided shoulder pain and shoulder blade pain occurring for several weeks.  Symptoms progressively worsening overnight.  Has become constant and has begun to radiate down the right arm.  Interfering with sleep.  Pain exacerbated when attempting to grip, move arm behind back and with lying.  Denies injury or trauma.  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  Has attempted use of tramadol which has been somewhat helpful.  Denies numbness or tingling.  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   History of asthma 1990s   with bronchitis   History of diverticulitis    ?IBS   Hypertension    Hypothyroidism 2015   ?by prior PCP   Osteopenia 10/19/2019   DEXA 10/2019 T -1.2 hip (osteopenia)   Perennial allergic rhinitis    allergy shots (sharma)   Prediabetes 06/2014   Right thyroid nodule 09/11/2014   S/p biopsy showing benign colloid nodule (08/2014)    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    Patient Active Problem List   Diagnosis Date Noted   Post-nasal drainage 10/06/2022   Lumbar spondylosis 02/11/2022   Pain in right thigh 01/08/2022   Nasal congestion 01/08/2022   Varicose veins of bilateral lower extremities with pain 02/17/2021   Vitamin D deficiency 08/19/2020   Low serum vitamin B12 06/12/2020   Chronic fatigue 06/12/2020   Tinnitus 11/21/2019   Osteopenia  10/19/2019   Carotid stenosis 08/09/2019   Lumbar herniated disc 07/19/2019   GERD (gastroesophageal reflux disease) 01/11/2019   Lumbar scoliosis 12/10/2018   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   Palpitations 09/06/2018   Abdominal bloating 12/06/2017   Left lateral epicondylitis 12/06/2017   Hypothyroidism 08/18/2017   Hearing loss due to cerumen impaction 07/09/2017   Abnormal drug screen 05/30/2016   HLD (hyperlipidemia) 10/17/2015   Weight loss 10/17/2015   Medicare annual wellness visit, subsequent 09/11/2014   Advanced care planning/counseling discussion 09/11/2014   Right thyroid nodule 09/11/2014   Pain in lateral portion of right knee 08/31/2014   Surgical menopause 08/21/2014   Perennial allergic rhinitis    MDD (major depressive disorder), recurrent episode, moderate (HCC)    Osteoarthritis    Hypertension    Prediabetes 06/30/2014    Past Surgical History:  Procedure Laterality Date   CATARACT EXTRACTION Left 03/31/2015   [facility]   COLONOSCOPY  10/30/2008   WNL rpt 10 yrs Madilyn Fireman)   COLONOSCOPY  01/2019   WNL, diverticulosis, rpt 10 yrs ([facility])   ESOPHAGOGASTRODUODENOSCOPY  10/30/2008   gastritis, CLO neg Madilyn Fireman)   ESOPHAGOGASTRODUODENOSCOPY  01/2019   gastritis, o/w normal ([facility])   OOPHORECTOMY  1990s   growth on ovary - benign  TOTAL ABDOMINAL HYSTERECTOMY  11/30/1993   both ovaries removed. CA125 level elevated - but no cancer    OB History   No obstetric history on file.      Home Medications    Prior to Admission medications   Medication Sig Start Date End Date Taking? Authorizing Provider  Baclofen 5 MG TABS Take 1 tablet (5 mg total) by mouth at bedtime as needed. 08/18/23  Yes Jeraldean Wechter R, NP  predniSONE (DELTASONE) 20 MG tablet Take 2 tablets (40 mg total) by mouth daily. 08/18/23  Yes Rece Zechman R, NP  amLODipine (NORVASC) 10 MG tablet TAKE 1 TABLET  BY MOUTH ONCE DAILY 08/04/23   Eustaquio Boyden, MD  atorvastatin (LIPITOR) 20 MG tablet TAKE ONE TABLET BY MOUTH ONCE DAILY 02/16/23   Eustaquio Boyden, MD  cetirizine (ZYRTEC) 10 MG tablet Take 1 tablet (10 mg total) by mouth daily. 03/01/23   Eden Emms, NP  Cholecalciferol (VITAMIN D3) 25 MCG (1000 UT) CAPS Take 2 capsules (2,000 Units total) by mouth daily. 02/17/21   Eustaquio Boyden, MD  clonazePAM (KLONOPIN) 0.5 MG tablet TAKE ONE TABLET BY MOUTH EVERYDAY AT BEDTIME 07/15/23   Eustaquio Boyden, MD  cyanocobalamin (V-R VITAMIN B-12) 500 MCG tablet Take 1 tablet (500 mcg total) by mouth every Monday, Wednesday, and Friday. 11/25/22   Eustaquio Boyden, MD  fluticasone [facility]) 50 MCG/ACT nasal spray Place 2 sprays into both nostrils daily. Patient taking differently: Place 2 sprays into both nostrils daily. As needed 03/08/23   Tower, Audrie Gallus, MD  levothyroxine (SYNTHROID) 75 MCG tablet TAKE ONE TABLET BY MOUTH EVERY MORNING 02/16/23   Eustaquio Boyden, MD  omeprazole (PRILOSEC) 40 MG capsule TAKE 1 CAPSULE BY MOUTH EVERY MORNING 08/04/23   Eustaquio Boyden, MD  oxybutynin (DITROPAN-XL) 5 MG 24 hr tablet TAKE 1 TABLET BY MOUTH EVERY DAY AT BEDTIME 08/04/23   Eustaquio Boyden, MD  sertraline (ZOLOFT) 100 MG tablet TAKE 1 TABLET BY MOUTH EVERY MORNING 08/04/23   Eustaquio Boyden, MD  traMADol (ULTRAM) 50 MG tablet Take 1 tablet 3 times a day by oral route as needed for pain for 10 days. 04/27/23   [provider]    Family History Family History  Problem Relation Age of Onset   Cancer Mother        ovarian x2   Ovarian cancer Mother    CAD Father        unclear   Breast cancer Maternal Grandmother    CAD Maternal Grandfather    Breast cancer Cousin        maternal   Breast cancer Cousin        paternal   Hypertension Neg Hx    Diabetes Neg Hx     Social History Social History   Tobacco Use   Smoking status: Never   Smokeless tobacco: Never  Vaping Use   Vaping status:  Never Used  Substance Use Topics   Alcohol use: No    Alcohol/week: 0.0 standard drinks of alcohol   Drug use: No     Allergies   Hctz [hydrochlorothiazide] and Sulfa antibiotics   Review of Systems Review of Systems   Physical Exam Triage Vital Signs ED Triage Vitals  Encounter Vitals Group     BP 08/18/23 1132 109/61     Systolic BP Percentile --      Diastolic BP Percentile --      Pulse Rate 08/18/23 1132 76     Resp 08/18/23 1132 16  Temp 08/18/23 1132 98.8 F (37.1 C)     Temp Source 08/18/23 1132 Oral     SpO2 08/18/23 1132 95 %     Weight --      Height --      Head Circumference --      Peak Flow --      Pain Score 08/18/23 1128 0     Pain Loc --      Pain Education --      Exclude from Growth Chart --    No data found.  Updated Vital Signs BP 109/61 (BP Location: Left Arm)   Pulse 76   Temp 98.8 F (37.1 C) (Oral)   Resp 16   SpO2 95%   Visual Acuity Right Eye Distance:   Left Eye Distance:   Bilateral Distance:    Right Eye Near:   Left Eye Near:    Bilateral Near:     Physical Exam Constitutional:      Appearance: Normal appearance.  Eyes:     Extraocular Movements: Extraocular movements intact.  Pulmonary:     Effort: Pulmonary effort is normal.  Musculoskeletal:     Comments: tenderness present along the superior of the right shoulder, no swelling or deformity, no tenderness directly along the joint, negative Hawking sign, able to fully abduct and adduct, strength 4 out of 5, 2+ brachial pulse  Neurological:     Mental Status: She is alert and oriented to person, place, and time. Mental status is at baseline.      UC Treatments / Results  Labs (all labs ordered are listed, but only abnormal results are displayed) Labs Reviewed - No data to display  EKG   Radiology No results found.  Procedures Procedures (including critical care time)  Medications Ordered in UC Medications  ketorolac (TORADOL) 30 MG/ML injection  30 mg (has no administration in time range)    Initial Impression / Assessment and Plan / UC Course  I have reviewed the triage vital signs and the nursing notes.  Pertinent labs & imaging results that were available during my care of the patient were reviewed by me and considered in my medical decision making (see chart for details).  Right shoulder strain, initial encounter  X-ray pending, most likely inflammatory cause related to muscular irritation, discussed this with patient, low suspicion for injury to the bone, Toradol IM given, prescribed prednisone and baclofen for outpatient use, discussed administration, recommended RICE, heat massage stretching and activity as tolerated, establish with [facility] recommended follow-up if symptoms continue to persist or worsen Final Clinical Impressions(s) / UC Diagnoses   Final diagnoses:  Shoulder strain, right, initial encounter     Discharge Instructions      Your pain is most likely caused by irritation to the muscles .  X-rays pending, you will be notified of results via telephone  You have been given an injection of Toradol which helps to reduce inflammation and helps with pain, daily will see improvement in 30 minutes to an hour  Starting tomorrow take prednisone every morning with food for 5 days to continue the above process, may use Tylenol in addition to this  May continue use of your tramadol as needed  May use baclofen which is a muscle relaxer at bedtime to allow you to rest, be mindful this will make you feel drowsy  You may use heating pad in 15 minute intervals as needed for additional comfort,or  you may find comfort in using ice in 10-15  minutes over affected area  Begin stretching affected area daily for 10 minutes as tolerated to further loosen muscles   When sitting and lying down place pillow underneath arm and behind back  If pain persist after recommended treatment or reoccurs if may be beneficial to  follow up with orthopedic specialist for evaluation, this doctor specializes in the bones and can manage your symptoms long-term with options such as but not limited to imaging, medications or physical therapy      ED Prescriptions     Medication Sig Dispense Auth. Provider   predniSONE (DELTASONE) 20 MG tablet Take 2 tablets (40 mg total) by mouth daily. 10 tablet Jaquavion Mccannon R, NP   Baclofen 5 MG TABS Take 1 tablet (5 mg total) by mouth at bedtime as needed. 15 tablet Jeorge Reister, Elita Boone, NP      PDMP not reviewed this encounter.   Valinda Hoar, NP 08/18/23 1154

## 2023-08-18 NOTE — Telephone Encounter (Signed)
2 patient identifiers used, reported shoulder x-ray results of osteoarthritis, advised continue treatment plan as discussed while in clinic, advised follow-up with orthopedic specialist if symptoms continue to persist or worsen, verbalized understanding

## 2023-08-22 DIAGNOSIS — M5416 Radiculopathy, lumbar region: Secondary | ICD-10-CM | POA: Diagnosis not present

## 2023-08-27 ENCOUNTER — Other Ambulatory Visit: Payer: Self-pay | Admitting: Family Medicine

## 2023-09-02 ENCOUNTER — Ambulatory Visit (INDEPENDENT_AMBULATORY_CARE_PROVIDER_SITE_OTHER): Payer: Medicare Other | Admitting: Nurse Practitioner

## 2023-09-02 ENCOUNTER — Encounter (INDEPENDENT_AMBULATORY_CARE_PROVIDER_SITE_OTHER): Payer: Self-pay | Admitting: Nurse Practitioner

## 2023-09-02 VITALS — BP 125/69 | HR 71 | Resp 16 | Wt 143.2 lb

## 2023-09-02 DIAGNOSIS — I83813 Varicose veins of bilateral lower extremities with pain: Secondary | ICD-10-CM

## 2023-09-02 NOTE — Progress Notes (Signed)
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   Procedure: Sclerotherapy using hypertonic saline mixed with 1% Lidocaine was performed on the bilateral lower extremity. Compression wraps were placed. The patient tolerated the procedure well.

## 2023-09-07 ENCOUNTER — Other Ambulatory Visit: Payer: Self-pay | Admitting: Family Medicine

## 2023-09-07 DIAGNOSIS — N3946 Mixed incontinence: Secondary | ICD-10-CM

## 2023-09-09 ENCOUNTER — Other Ambulatory Visit: Payer: Self-pay | Admitting: Family Medicine

## 2023-09-09 DIAGNOSIS — N3946 Mixed incontinence: Secondary | ICD-10-CM

## 2023-09-10 NOTE — Telephone Encounter (Signed)
ERx 

## 2023-10-01 ENCOUNTER — Encounter (INDEPENDENT_AMBULATORY_CARE_PROVIDER_SITE_OTHER): Payer: Self-pay | Admitting: Nurse Practitioner

## 2023-10-01 ENCOUNTER — Ambulatory Visit (INDEPENDENT_AMBULATORY_CARE_PROVIDER_SITE_OTHER): Payer: Medicare Other | Admitting: Nurse Practitioner

## 2023-10-01 VITALS — BP 112/62 | HR 74 | Resp 18 | Ht 62.0 in | Wt 142.0 lb

## 2023-10-01 DIAGNOSIS — I83813 Varicose veins of bilateral lower extremities with pain: Secondary | ICD-10-CM | POA: Diagnosis not present

## 2023-10-01 NOTE — Progress Notes (Signed)
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   Procedure: Sclerotherapy using hypertonic saline mixed with 1% Lidocaine was performed on the bilateral lower extremity. Compression wraps were placed. The patient tolerated the procedure well.

## 2023-10-04 ENCOUNTER — Other Ambulatory Visit: Payer: Self-pay | Admitting: Family Medicine

## 2023-10-04 DIAGNOSIS — F331 Major depressive disorder, recurrent, moderate: Secondary | ICD-10-CM

## 2023-10-05 NOTE — Telephone Encounter (Signed)
ERx 

## 2023-10-13 DIAGNOSIS — J310 Chronic rhinitis: Secondary | ICD-10-CM | POA: Diagnosis not present

## 2023-10-13 DIAGNOSIS — R1314 Dysphagia, pharyngoesophageal phase: Secondary | ICD-10-CM | POA: Diagnosis not present

## 2023-10-13 DIAGNOSIS — K219 Gastro-esophageal reflux disease without esophagitis: Secondary | ICD-10-CM | POA: Diagnosis not present

## 2023-10-13 DIAGNOSIS — H6123 Impacted cerumen, bilateral: Secondary | ICD-10-CM | POA: Diagnosis not present

## 2023-10-13 DIAGNOSIS — J311 Chronic nasopharyngitis: Secondary | ICD-10-CM | POA: Diagnosis not present

## 2023-10-13 DIAGNOSIS — H903 Sensorineural hearing loss, bilateral: Secondary | ICD-10-CM | POA: Diagnosis not present

## 2023-10-19 DIAGNOSIS — R208 Other disturbances of skin sensation: Secondary | ICD-10-CM | POA: Diagnosis not present

## 2023-10-19 DIAGNOSIS — L538 Other specified erythematous conditions: Secondary | ICD-10-CM | POA: Diagnosis not present

## 2023-10-19 DIAGNOSIS — L820 Inflamed seborrheic keratosis: Secondary | ICD-10-CM | POA: Diagnosis not present

## 2023-11-01 ENCOUNTER — Other Ambulatory Visit: Payer: Self-pay | Admitting: Family Medicine

## 2023-11-01 DIAGNOSIS — I10 Essential (primary) hypertension: Secondary | ICD-10-CM

## 2023-11-01 DIAGNOSIS — F331 Major depressive disorder, recurrent, moderate: Secondary | ICD-10-CM

## 2023-11-03 NOTE — Telephone Encounter (Signed)
ERx 

## 2023-11-10 DIAGNOSIS — M5451 Vertebrogenic low back pain: Secondary | ICD-10-CM | POA: Diagnosis not present

## 2023-11-15 ENCOUNTER — Ambulatory Visit (INDEPENDENT_AMBULATORY_CARE_PROVIDER_SITE_OTHER): Payer: Medicare Other

## 2023-11-15 VITALS — Ht 62.0 in | Wt 142.0 lb

## 2023-11-15 DIAGNOSIS — Z0000 Encounter for general adult medical examination without abnormal findings: Secondary | ICD-10-CM

## 2023-11-15 NOTE — Patient Instructions (Addendum)
Donna Vargas , Thank you for taking time to come for your Medicare Wellness Visit. I appreciate your ongoing commitment to your health goals. Please review the following plan we discussed and let me know if I can assist you in the future.   Referrals/Orders/Follow-Ups/Clinician Recommendations: none  This is a list of the screening recommended for you and due dates:  Health Maintenance  Topic Date Due   Zoster (Shingles) Vaccine (1 of 2) Never done   COVID-19 Vaccine (3 - 2024-25 season) 08/01/2023   Flu Shot  02/27/2025*   Mammogram  01/09/2024   DTaP/Tdap/Td vaccine (2 - Td or Tdap) 06/30/2024   Medicare Annual Wellness Visit  11/14/2024   Colon Cancer Screening  02/15/2029   Pneumonia Vaccine  Completed   DEXA scan (bone density measurement)  Completed   Hepatitis C Screening  Completed   HPV Vaccine  Aged Out  *Topic was postponed. The date shown is not the original due date.    Advanced directives: (Copy Requested) Please bring a copy of your health care power of attorney and living will to the office to be added to your chart at your convenience.  Next Medicare Annual Wellness Visit scheduled for next year: Yes 11/15/2024 @ 3:40pm telephone

## 2023-11-15 NOTE — Progress Notes (Signed)
Subjective:   Donna Vargas is a 77 y.o. female who presents for Medicare Annual (Subsequent) preventive examination.  Visit Complete: Virtual I connected with  Donna Vargas on 11/15/23 by a audio enabled telemedicine application and verified that I am speaking with the correct person using two identifiers.  Patient Location: Home  Provider Location: Office/Clinic  I discussed the limitations of evaluation and management by telemedicine. The patient expressed understanding and agreed to proceed.  Vital Signs: Because this visit was a virtual/telehealth visit, some criteria may be missing or patient reported. Any vitals not documented were not able to be obtained and vitals that have been documented are patient reported.  Patient Medicare AWV questionnaire was completed by the patient on (not done); I have confirmed that all information answered by patient is correct and no changes since this date. Cardiac Risk Factors include: advanced age (>40men, >26 women);dyslipidemia;hypertension;sedentary lifestyle    Objective:    Today's Vitals   11/15/23 1529  Weight: 142 lb (64.4 kg)  Height: 5\' 2"  (1.575 m)   Body mass index is 25.97 kg/m.     11/15/2023    3:45 PM 04/23/2023   10:27 AM 08/13/2020   11:16 AM 03/17/2018    8:26 AM 03/10/2017   10:51 AM  Advanced Directives  Does Patient Have a Medical Advance Directive? Yes Yes Yes Yes Yes  Type of Estate agent of [place];Living will Living will;Healthcare Power of [employer] Power of [place];Living will Healthcare Power of [place];Living will Healthcare Power of [place];Living will  Copy of Healthcare Power of Attorney in Chart? No - copy requested  No - copy requested No - copy requested No - copy requested    Current Medications (verified) Outpatient Encounter Medications as of 11/15/2023  Medication Sig   amLODipine (NORVASC) 10 MG tablet TAKE 1 TABLET BY MOUTH ONCE DAILY    atorvastatin (LIPITOR) 20 MG tablet TAKE ONE TABLET BY MOUTH ONCE DAILY   Baclofen 5 MG TABS Take 1 tablet (5 mg total) by mouth at bedtime as needed.   cetirizine (ZYRTEC) 10 MG tablet Take 1 tablet (10 mg total) by mouth daily.   Cholecalciferol (VITAMIN D3) 25 MCG (1000 UT) CAPS Take 2 capsules (2,000 Units total) by mouth daily.   clonazePAM (KLONOPIN) 0.5 MG tablet TAKE 1 TABLET BY MOUTH EVERY DAY AT BEDTIME   cyanocobalamin (V-R VITAMIN B-12) 500 MCG tablet Take 1 tablet (500 mcg total) by mouth every Monday, Wednesday, and Friday.   fluticasone (FLONASE) 50 MCG/ACT nasal spray Place 2 sprays into both nostrils daily. (Patient taking differently: Place 2 sprays into both nostrils daily. As needed)   levothyroxine (SYNTHROID) 75 MCG tablet TAKE ONE TABLET BY MOUTH EVERY MORNING   omeprazole (PRILOSEC) 40 MG capsule TAKE 1 CAPSULE BY MOUTH EVERY MORNING   oxybutynin (DITROPAN-XL) 5 MG 24 hr tablet TAKE 1 TABLET BY MOUTH EVERY DAY AT BEDTIME   predniSONE (DELTASONE) 20 MG tablet Take 2 tablets (40 mg total) by mouth daily.   sertraline (ZOLOFT) 100 MG tablet TAKE 1 TABLET BY MOUTH EVERY MORNING   traMADol (ULTRAM) 50 MG tablet Take 1 tablet 3 times a day by oral route as needed for pain for 10 days.   No facility-administered encounter medications on file as of 11/15/2023.    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   History of asthma 1990s   with bronchitis   History of diverticulitis    ?IBS   Hypertension    Hypothyroidism 2015   ?by prior PCP   Osteopenia 10/19/2019   DEXA 10/2019 T -1.2 hip (osteopenia)   Perennial allergic rhinitis    allergy shots (sharma)   Prediabetes 06/2014   Right  thyroid nodule 09/11/2014   S/p biopsy showing benign colloid nodule (08/2014)    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 CA125 level elevated - but no cancer   Family History  Problem Relation Age of Onset   Cancer Mother        ovarian x2   Ovarian cancer Mother    CAD Father        unclear   Breast cancer Maternal Grandmother    CAD Maternal Grandfather    Breast cancer Cousin        maternal   Breast cancer Cousin        paternal   Hypertension Neg Hx    Diabetes Neg Hx    Social History   Socioeconomic History   Marital status: Married    Spouse name: Not on file   Number of children: Not on file   Years of education: Not on file   Highest education level: 12th grade  Occupational History   Not on file  Tobacco Use   Smoking status: Never   Smokeless tobacco: Never  Vaping Use   Vaping status: Never Used  Substance and Sexual Activity   Alcohol use: No    Alcohol/week: 0.0 standard drinks of alcohol   Drug use: No   Sexual activity: Yes  Other Topics Concern   Not on file  Social History Narrative   "Donna Vargas"   Lives with husband, horse, donkeys, rabbits and goats   Family nearby   Occupation: retired, 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)   Edu: HS   Activity: no regular exercise, yardwork   Diet: no water, some fruits/vegetables   Social Drivers of Corporate investment banker Strain: Low Risk  (11/15/2023)   Overall Financial Resource Strain (CARDIA)    Difficulty of  Paying Living Expenses: Not hard at all  Food Insecurity: No Food Insecurity (11/15/2023)   Hunger Vital Sign    Worried About Running Out of Food in the Last Year: Never true    Ran Out of Food in the Last Year: Never true  Transportation Needs: No Transportation Needs (11/15/2023)   PRAPARE - Administrator, Civil Service (Medical): No    Lack of Transportation (Non-Medical): No  Physical Activity: Inactive (11/15/2023)   Exercise Vital Sign    Days of Exercise per Week: 0 days    Minutes of Exercise per Session: 0 min  Stress: No Stress Concern  Present (11/15/2023)   [employer] of Occupational Health - Occupational Stress Questionnaire    Feeling of Stress : Only a little  Social Connections: Moderately Isolated (11/15/2023)   Social Connection and Isolation Panel [NHANES]    Frequency of Communication with Friends and Family: More than three times a week    Frequency of Social Gatherings with Friends and Family: Once a week    Attends Religious Services: More than 4 times per year    Active Member of [employer] or Organizations: No    Attends Banker Meetings: Never    Marital Status: Separated    Tobacco Counseling Counseling given: Not Answered  Clinical Intake:  Pre-visit preparation completed: Yes  Pain : No/denies pain   BMI - recorded: 25.97 Nutritional Status: BMI > 30  Obese Nutritional Risks: None Diabetes: No  How often do you need to have someone help you when you read instructions, pamphlets, or other written materials from your doctor or pharmacy?: 1 - Never  Interpreter Needed?: No  Comments: lives alone Information entered by :: B.Geovanna Simko,LPN   Activities of Daily Living    11/15/2023    3:45 PM  In your present state of health, do you have any difficulty performing the following activities:  Hearing? 1  Vision? 0  Difficulty concentrating or making decisions? 1  Walking or climbing stairs? 0  Dressing or bathing?  0  Doing errands, shopping? 0  Preparing Food and eating ? N  Using the Toilet? N  In the past six months, have you accidently leaked urine? N  Do you have problems with loss of bowel control? N  Managing your Medications? N  Managing your Finances? N  Housekeeping or managing your Housekeeping? N    Patient Care Team: Eustaquio Boyden, MD as PCP - General (Family Medicine) Kathyrn Sheriff, [facility] (Inactive) as Pharmacist (Pharmacist) Domingo Madeira, OD (Optometry)  Indicate any recent Medical Services you may have received from other than [facility] providers in the past year (date may be approximate).     Assessment:   This is a routine wellness examination for [place].  Hearing/Vision screen Hearing Screening - Comments:: ENT appt 2 weeks ago due not being able to hear. Says needs hearing aids but pt does not want just yet Vision Screening - Comments:: Pt says her vision is good w/bi-focals Dr [facility]    Goals Addressed             This Visit's Progress    Increase physical activity   Not on track    When weather permits, I will continue to do yard work and garden for at least 30 min daily.      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      COMPLETED: Patient Stated   On track    08/13/2020, I will maintain and continue medications as prescribed.        Depression Screen    11/15/2023    3:39 PM 06/25/2023   10:40 AM 05/26/2023   12:42 PM 04/08/2023    2:27 PM 03/01/2023    3:06 PM 11/25/2022    1:32 PM 01/20/2022    9:33 AM  PHQ 2/9 Scores  PHQ - 2 Score 0 2 2 0 2 0 0  PHQ- 9 Score  8 8 5 9  5  Fall Risk    11/15/2023    3:34 PM 05/26/2023   12:42 PM 04/08/2023    2:27 PM 03/01/2023    3:06 PM 11/25/2022   12:29 PM  Fall Risk   Falls in the past year? 0 0 0 0 0  Number falls in past yr: 0  0 0   Injury with Fall? 0  0 0   Risk for fall due to :  No Fall Risks  No Fall Risks No Fall Risks   Follow up Education provided;Falls prevention discussed  Falls evaluation completed Falls evaluation completed     MEDICARE RISK AT HOME: Medicare Risk at Home Any stairs in or around the home?: No (his ramps) If so, are there any without handrails?: No Home free of loose throw rugs in walkways, pet beds, electrical cords, etc?: Yes Adequate lighting in your home to reduce risk of falls?: Yes Life alert?: No Use of a cane, walker or w/c?: Yes (cane) Grab bars in the bathroom?: Yes Shower chair or bench in shower?: Yes Elevated toilet seat or a handicapped toilet?: Yes  TIMED UP AND GO:  Was the test performed?  No    Cognitive Function:    08/13/2020   11:20 AM 03/17/2018    8:26 AM 03/10/2017   10:39 AM  MMSE - Mini Mental State Exam  Orientation to time 5 5 5   Orientation to Place 5 5 5   Registration 3 3 3   Attention/ Calculation 5 0 0  Recall 3 3 3   Language- name 2 objects  0 0  Language- repeat 1 1 1   Language- follow 3 step command  3 3  Language- read & follow direction  0 0  Write a sentence  0 0  Copy design  0 0  Total score  20 20        11/15/2023    3:46 PM  6CIT Screen  What Year? 0 points  What month? 0 points  What time? 0 points  Count back from 20 0 points  Months in reverse 0 points  Repeat phrase 0 points  Total Score 0 points    Immunizations Immunization History  Administered Date(s) Administered   PFIZER(Purple Top)SARS-COV-2 Vaccination 03/31/2020, 04/26/2020   Pneumococcal Conjugate-13 02/15/2017   Pneumococcal Polysaccharide-23 03/17/2018   Tdap 06/30/2014    TDAP status: Up to date  Flu Vaccine status: Declined, Education has been provided regarding the importance of this vaccine but patient still declined. Advised may receive this vaccine at local pharmacy or Health Dept. Aware to provide a copy of the vaccination record if obtained from local pharmacy or Health Dept. Verbalized  acceptance and understanding.  Pneumococcal vaccine status: Up to date  Covid-19 vaccine status: Completed vaccines  Qualifies for Shingles Vaccine? Yes   Zostavax completed No   Shingrix Completed?: No.    Education has been provided regarding the importance of this vaccine. Patient has been advised to call insurance company to determine out of pocket expense if they have not yet received this vaccine. Advised may also receive vaccine at local pharmacy or Health Dept. Verbalized acceptance and understanding.  Screening Tests Health Maintenance  Topic Date Due   COVID-19 Vaccine (3 - 2024-25 season) 12/01/2023 (Originally 08/01/2023)   Zoster Vaccines- Shingrix (1 of 2) 02/13/2024 (Originally 09/25/1998)   INFLUENZA VACCINE  02/27/2025 (Originally 07/01/2023)   MAMMOGRAM  01/09/2024   DTaP/Tdap/Td (2 - Td or Tdap) 06/30/2024   Medicare Annual Wellness (AWV)  11/14/2024  Colonoscopy  02/15/2029   Pneumonia Vaccine 90+ Years old  Completed   DEXA SCAN  Completed   Hepatitis C Screening  Completed   HPV VACCINES  Aged Out    Health Maintenance  There are no preventive care reminders to display for this patient.   Colorectal cancer screening: No longer required.   Mammogram status: No longer required due to age.  Bone Density status: Completed 10/17/2019. Results reflect: Bone density results: OSTEOPENIA. Repeat every 3-5 years.  Lung Cancer Screening: (Low Dose CT Chest recommended if Age 65-80 years, 20 pack-year currently smoking OR have quit w/in 15years.) does not qualify.   Lung Cancer Screening Referral: no  Additional Screening:  Hepatitis C Screening: does not qualify; Completed no  Vision Screening: Recommended annual ophthalmology exams for early detection of glaucoma and other disorders of the eye. Is the patient up to date with their annual eye exam?  Yes  Who is the provider or what is the name of the office in which the patient attends annual eye exams? Dr Adline Peals If pt is not established with a provider, would they like to be referred to a provider to establish care? No .   Dental Screening: Recommended annual dental exams for proper oral hygiene  Diabetic Foot Exam: n/a  Community Resource Referral / Chronic Care Management: CRR required this visit?  No   CCM required this visit?  No    Plan:     I have personally reviewed and noted the following in the patient's chart:   Medical and social history Use of alcohol, tobacco or illicit drugs  Current medications and supplements including opioid prescriptions. Patient is not currently taking opioid prescriptions. Functional ability and status Nutritional status Physical activity Advanced directives List of other physicians Hospitalizations, surgeries, and ER visits in previous 12 months Vitals Screenings to include cognitive, depression, and falls Referrals and appointments  In addition, I have reviewed and discussed with patient certain preventive protocols, quality metrics, and best practice recommendations. A written personalized care plan for preventive services as well as general preventive health recommendations were provided to patient.    Sue Lush, LPN   09/26/2535   After Visit Summary: (MyChart) Due to this being a telephonic visit, the after visit summary with patients personalized plan was offered to patient via MyChart   Nurse Notes: The patient states she is doing alright. She does express needing to talk with PCP about leg and back pain. She relays she does not exercise due to these pains. Pt has PCP appt for PE on 11/29/23. She has no concerns or questions at this time.

## 2023-11-18 ENCOUNTER — Other Ambulatory Visit: Payer: Self-pay | Admitting: Family Medicine

## 2023-11-18 DIAGNOSIS — E538 Deficiency of other specified B group vitamins: Secondary | ICD-10-CM

## 2023-11-18 DIAGNOSIS — E039 Hypothyroidism, unspecified: Secondary | ICD-10-CM

## 2023-11-18 DIAGNOSIS — R7303 Prediabetes: Secondary | ICD-10-CM

## 2023-11-18 DIAGNOSIS — E785 Hyperlipidemia, unspecified: Secondary | ICD-10-CM

## 2023-11-18 DIAGNOSIS — E559 Vitamin D deficiency, unspecified: Secondary | ICD-10-CM

## 2023-11-22 ENCOUNTER — Other Ambulatory Visit (INDEPENDENT_AMBULATORY_CARE_PROVIDER_SITE_OTHER): Payer: Medicare Other

## 2023-11-22 DIAGNOSIS — E785 Hyperlipidemia, unspecified: Secondary | ICD-10-CM

## 2023-11-22 DIAGNOSIS — R7303 Prediabetes: Secondary | ICD-10-CM | POA: Diagnosis not present

## 2023-11-22 DIAGNOSIS — E559 Vitamin D deficiency, unspecified: Secondary | ICD-10-CM | POA: Diagnosis not present

## 2023-11-22 DIAGNOSIS — E538 Deficiency of other specified B group vitamins: Secondary | ICD-10-CM | POA: Diagnosis not present

## 2023-11-22 DIAGNOSIS — E039 Hypothyroidism, unspecified: Secondary | ICD-10-CM | POA: Diagnosis not present

## 2023-11-22 LAB — LIPID PANEL
Cholesterol: 154 mg/dL (ref 0–200)
HDL: 48.8 mg/dL (ref >39.00)
LDL Cholesterol: 83 mg/dL (ref 0–99)
NonHDL: 104.87
Total CHOL/HDL Ratio: 3
Triglycerides: 109 mg/dL (ref 0.0–149.0)
VLDL: 21.8 mg/dL (ref 0.0–40.0)

## 2023-11-22 LAB — CBC WITH DIFFERENTIAL/PLATELET
Basophils Absolute: 0 10*3/uL (ref 0.0–0.1)
Basophils Relative: 0.7 % (ref 0.0–3.0)
Eosinophils Absolute: 0.3 10*3/uL (ref 0.0–0.7)
Eosinophils Relative: 4 % (ref 0.0–5.0)
HCT: 40.6 % (ref 36.0–46.0)
Hemoglobin: 13.6 g/dL (ref 12.0–15.0)
Lymphocytes Relative: 25.3 % (ref 12.0–46.0)
Lymphs Abs: 1.7 10*3/uL (ref 0.7–4.0)
MCHC: 33.5 g/dL (ref 30.0–36.0)
MCV: 91.3 fL (ref 78.0–100.0)
Monocytes Absolute: 0.5 10*3/uL (ref 0.1–1.0)
Monocytes Relative: 8 % (ref 3.0–12.0)
Neutro Abs: 4.1 10*3/uL (ref 1.4–7.7)
Neutrophils Relative %: 62 % (ref 43.0–77.0)
Platelets: 219 10*3/uL (ref 150.0–400.0)
RBC: 4.45 Mil/uL (ref 3.87–5.11)
RDW: 13.3 % (ref 11.5–15.5)
WBC: 6.6 10*3/uL (ref 4.0–10.5)

## 2023-11-22 LAB — COMPREHENSIVE METABOLIC PANEL
ALT: 10 U/L (ref 0–35)
AST: 15 U/L (ref 0–37)
Albumin: 4.3 g/dL (ref 3.5–5.2)
Alkaline Phosphatase: 96 U/L (ref 39–117)
BUN: 16 mg/dL (ref 6–23)
CO2: 29 meq/L (ref 19–32)
Calcium: 9.3 mg/dL (ref 8.4–10.5)
Chloride: 107 meq/L (ref 96–112)
Creatinine, Ser: 0.82 mg/dL (ref 0.40–1.20)
GFR: 70.1 mL/min (ref >60.00)
Glucose, Bld: 105 mg/dL — ABNORMAL HIGH (ref 70–99)
Potassium: 4.2 meq/L (ref 3.5–5.1)
Sodium: 145 meq/L (ref 135–145)
Total Bilirubin: 0.3 mg/dL (ref 0.2–1.2)
Total Protein: 7.3 g/dL (ref 6.0–8.3)

## 2023-11-22 LAB — VITAMIN B12: Vitamin B-12: 615 pg/mL (ref 211–911)

## 2023-11-22 LAB — TSH: TSH: 5.15 u[IU]/mL (ref 0.35–5.50)

## 2023-11-22 LAB — VITAMIN D 25 HYDROXY (VIT D DEFICIENCY, FRACTURES): VITD: 46 ng/mL (ref 30.00–100.00)

## 2023-11-22 LAB — HEMOGLOBIN A1C: Hgb A1c MFr Bld: 6.2 % (ref 4.6–6.5)

## 2023-11-26 DIAGNOSIS — J310 Chronic rhinitis: Secondary | ICD-10-CM | POA: Diagnosis not present

## 2023-11-26 DIAGNOSIS — H903 Sensorineural hearing loss, bilateral: Secondary | ICD-10-CM | POA: Diagnosis not present

## 2023-11-26 DIAGNOSIS — J311 Chronic nasopharyngitis: Secondary | ICD-10-CM | POA: Diagnosis not present

## 2023-11-26 DIAGNOSIS — K219 Gastro-esophageal reflux disease without esophagitis: Secondary | ICD-10-CM | POA: Diagnosis not present

## 2023-11-26 DIAGNOSIS — H9313 Tinnitus, bilateral: Secondary | ICD-10-CM | POA: Diagnosis not present

## 2023-11-29 ENCOUNTER — Ambulatory Visit (INDEPENDENT_AMBULATORY_CARE_PROVIDER_SITE_OTHER): Payer: Medicare Other | Admitting: Family Medicine

## 2023-11-29 ENCOUNTER — Encounter: Payer: Self-pay | Admitting: Family Medicine

## 2023-11-29 VITALS — BP 128/66 | HR 71 | Temp 98.4°F | Ht 61.5 in | Wt 142.0 lb

## 2023-11-29 DIAGNOSIS — E039 Hypothyroidism, unspecified: Secondary | ICD-10-CM

## 2023-11-29 DIAGNOSIS — R1310 Dysphagia, unspecified: Secondary | ICD-10-CM | POA: Insufficient documentation

## 2023-11-29 DIAGNOSIS — E2839 Other primary ovarian failure: Secondary | ICD-10-CM

## 2023-11-29 DIAGNOSIS — I10 Essential (primary) hypertension: Secondary | ICD-10-CM | POA: Diagnosis not present

## 2023-11-29 DIAGNOSIS — F331 Major depressive disorder, recurrent, moderate: Secondary | ICD-10-CM

## 2023-11-29 DIAGNOSIS — R7303 Prediabetes: Secondary | ICD-10-CM

## 2023-11-29 DIAGNOSIS — E538 Deficiency of other specified B group vitamins: Secondary | ICD-10-CM | POA: Diagnosis not present

## 2023-11-29 DIAGNOSIS — N3946 Mixed incontinence: Secondary | ICD-10-CM | POA: Diagnosis not present

## 2023-11-29 DIAGNOSIS — K219 Gastro-esophageal reflux disease without esophagitis: Secondary | ICD-10-CM | POA: Diagnosis not present

## 2023-11-29 DIAGNOSIS — Z7189 Other specified counseling: Secondary | ICD-10-CM

## 2023-11-29 DIAGNOSIS — E559 Vitamin D deficiency, unspecified: Secondary | ICD-10-CM | POA: Diagnosis not present

## 2023-11-29 DIAGNOSIS — R634 Abnormal weight loss: Secondary | ICD-10-CM

## 2023-11-29 DIAGNOSIS — M85859 Other specified disorders of bone density and structure, unspecified thigh: Secondary | ICD-10-CM

## 2023-11-29 DIAGNOSIS — H6123 Impacted cerumen, bilateral: Secondary | ICD-10-CM

## 2023-11-29 DIAGNOSIS — M150 Primary generalized (osteo)arthritis: Secondary | ICD-10-CM

## 2023-11-29 DIAGNOSIS — E785 Hyperlipidemia, unspecified: Secondary | ICD-10-CM | POA: Diagnosis not present

## 2023-11-29 DIAGNOSIS — E041 Nontoxic single thyroid nodule: Secondary | ICD-10-CM

## 2023-11-29 MED ORDER — AMLODIPINE BESYLATE 10 MG PO TABS: 10.0000 mg | ORAL_TABLET | Freq: Every day | ORAL | 4 refills | Status: DC

## 2023-11-29 MED ORDER — LEVOTHYROXINE SODIUM 75 MCG PO TABS: 75.0000 ug | ORAL_TABLET | Freq: Every morning | ORAL | 4 refills | Status: DC

## 2023-11-29 MED ORDER — OXYBUTYNIN CHLORIDE ER 5 MG PO TB24: 5.0000 mg | ORAL_TABLET | Freq: Every day | ORAL | 4 refills | Status: DC

## 2023-11-29 MED ORDER — OMEPRAZOLE 40 MG PO CPDR: 40.0000 mg | DELAYED_RELEASE_CAPSULE | Freq: Every morning | ORAL | 4 refills | Status: DC

## 2023-11-29 MED ORDER — ACETAMINOPHEN 500 MG PO TABS: 500.0000 mg | ORAL_TABLET | Freq: Every day | ORAL | Status: AC

## 2023-11-29 MED ORDER — SERTRALINE HCL 100 MG PO TABS: 100.0000 mg | ORAL_TABLET | Freq: Every morning | ORAL | 4 refills | Status: DC

## 2023-11-29 MED ORDER — ATORVASTATIN CALCIUM 20 MG PO TABS: 20.0000 mg | ORAL_TABLET | Freq: Every day | ORAL | 4 refills | Status: DC

## 2023-11-29 NOTE — Assessment & Plan Note (Addendum)
Chronic, stable on atorvastatin-continue. The 10-year ASCVD risk score (Arnett DK, et al., 2019) is: 20.6%   Values used to calculate the score:     Age: 77 years     Sex: Female     Is Non-Hispanic African American: No     Diabetic: No     Tobacco smoker: No     Systolic Blood Pressure: 128 mmHg     Is BP treated: Yes     HDL Cholesterol: 48.8 mg/dL     Total Cholesterol: 154 mg/dL

## 2023-11-29 NOTE — Assessment & Plan Note (Signed)
Level stable on vitamin D replacement.

## 2023-11-29 NOTE — Assessment & Plan Note (Signed)
Chronic, stable levothyroxine 75 mcg daily..  TSH trending up, advised monitor for hypothyroid symptoms and let us know if these develop.

## 2023-11-29 NOTE — Assessment & Plan Note (Signed)
Encouraged limiting added sugar in diet.  

## 2023-11-29 NOTE — Assessment & Plan Note (Signed)
Appreciate Ortho and PM&R care.

## 2023-11-29 NOTE — Assessment & Plan Note (Signed)
Gastritis on latest endoscopy 2020. Continue omeprazole 40 mg daily.

## 2023-11-29 NOTE — Assessment & Plan Note (Signed)
Chronic, stable on amlodipine 10mg  daily. Suggested dropping dose to 1/2 tab if low BP symptoms develop.

## 2023-11-29 NOTE — Assessment & Plan Note (Signed)
She states she was told she was a candidate for hearing aids but has declined these for now.  Will request latest ENT records.

## 2023-11-29 NOTE — Assessment & Plan Note (Signed)
Will order updated DEXA.

## 2023-11-29 NOTE — Assessment & Plan Note (Signed)
Endorses ongoing intermittent difficulty with solid foods as well as weight loss noted-will check barium swallow.  Low threshold to refer to speech therapy for modified barium swallow.

## 2023-11-29 NOTE — Assessment & Plan Note (Signed)
Ongoing, patient endorses intentional weight loss. Continue to monitor closely, 52-month follow-up in office. Will also order barium swallow for endorsed dysphagia.

## 2023-11-29 NOTE — Assessment & Plan Note (Signed)
Chronic, stable period on sertraline 100mg  daily - continue this.

## 2023-11-29 NOTE — Progress Notes (Signed)
Ph: [phone] Fax: [phone]   Patient ID: Donna Vargas, female    DOB: 01-May-1946, 77 y.o.   MRN: [id number]  This visit was conducted in person.  BP 128/66   Pulse 71   Temp 98.4 F (36.9 C) (Oral)   Ht 5' 1.5" (1.562 m)   Wt 142 lb (64.4 kg)   SpO2 98%   BMI 26.40 kg/m   BP Readings from Last 3 Encounters:  11/29/23 128/66  10/01/23 112/62  09/02/23 125/69   CC: AMW f/u visit  Subjective:   HPI: Donna Vargas is a 77 y.o. female presenting on 11/29/2023 for Annual Exam (MCR prt 2 [AWV- 11/15/23]. )   Saw health advisor 2 wks ago for medicare wellness visit. Note reviewed.   No results found.  Flowsheet Row Clinical Support from 11/15/2023 in [facility] [place]  PHQ-2 Total Score 0          11/15/2023    3:34 PM 05/26/2023   12:42 PM 04/08/2023    2:27 PM 03/01/2023    3:06 PM 11/25/2022   12:29 PM  Fall Risk   Falls in the past year? 0 0 0 0 0  Number falls in past yr: 0  0 0   Injury with Fall? 0  0 0   Risk for fall due to : No Fall Risks  No Fall Risks No Fall Risks   Follow up Education provided;Falls prevention discussed  Falls evaluation completed Falls evaluation completed    Lost her mother Oct 2024. She is living at her mother's place. Husband   Some low diastolic bps recently. She is planning to start monitoring more closely.   Known lumbar disc disease followed by ortho and PM&R ([facility]) s/p steroid injections. Currently on prednisone taper for ongoing lower back pain.   S/p TAH, oophorectomy 1990s.   18 lb weight loss over the past 6 months - intentional. She states she dropped to 139 lbs but gained some weight during the holidays.  She notes abdomen remains large. No GERD symptoms, no nausea/vomiting. Notes some trouble swallowing attributed to dry mouth - worse with solid foods. No early satiety.  ESOPHAGOGASTRODUODENOSCOPY 01/2019 - gastritis, o/w normal ([facility])  Sees [facility] -  treated for infection. Told qualified for hearing aides but she declined.   Preventative: COLONOSCOPY 01/2019 - WNL, diverticulosis, rpt 10 yrs ([facility]od) TOTAL ABDOMINAL HYSTERECTOMY Date: 1995 both ovaries removed. CA125 level elevated - but no cancer. Pap smear 2014 - continued despite hysterectomy. Always normal. We have stopped cervical cancer screening.  G1P1  Mammogram - Birads1 @ [facility] 12/2022.  DEXA 10/2019 T -1.2 hip (osteopenia).  Lung cancer screening - not eligible  Flu shot declines  COVID vaccine - Pfizer 03/2020 x2, no booster Prevnar-13 01/2017, pneumovax-23 02/2018  Tdap 06/2014 Shingrix - declines. Has had shingles x1.  Advanced directive: living will scanned 01/2015. Working on [employer] form. Would want husband to be HCPOA. Has filled out - will bring copy.  Seat belt use discussed.  Sunscreen use discussed. No changing moles on skin. Sees derm regularly.  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 Interim medical history since our last visit reviewed. Allergies and medications reviewed and updated. Outpatient Medications Prior to Visit  Medication Sig Dispense Refill   cetirizine (ZYRTEC) 10 MG tablet Take 1 tablet (10 mg total) by mouth daily. 30 tablet 11   Cholecalciferol (VITAMIN D3) 25 MCG (1000 UT) CAPS Take 2 capsules (2,000 Units total) by mouth daily.     clonazePAM (KLONOPIN) 0.5 MG tablet TAKE 1 TABLET BY MOUTH EVERY DAY AT BEDTIME 30 tablet 2   cyanocobalamin (V-R VITAMIN B-12)  500 MCG tablet Take 1 tablet (500 mcg total) by mouth every Monday, Wednesday, and Friday.     fluticasone (FLONASE) 50 MCG/ACT nasal spray Place 2 sprays into both nostrils daily. (Patient taking differently: Place 2 sprays into both nostrils daily. As needed) 16 g 2   traMADol (ULTRAM) 50 MG tablet Take 1 tablet 3 times a day by oral route as needed for pain for 10 days.     amLODipine (NORVASC) 10 MG tablet TAKE 1 TABLET BY MOUTH ONCE DAILY 90 tablet 0   atorvastatin (LIPITOR) 20 MG tablet TAKE ONE TABLET BY MOUTH ONCE DAILY 90 tablet 3   levothyroxine (SYNTHROID) 75 MCG tablet TAKE ONE TABLET BY MOUTH EVERY MORNING 90 tablet 3   omeprazole (PRILOSEC) 40 MG capsule TAKE 1 CAPSULE BY MOUTH EVERY MORNING 90 capsule 0   oxybutynin (DITROPAN-XL) 5 MG 24 hr tablet TAKE 1 TABLET BY MOUTH EVERY DAY AT BEDTIME 90 tablet 3   sertraline (ZOLOFT) 100 MG tablet TAKE 1 TABLET BY MOUTH EVERY MORNING 90 tablet 0   predniSONE (STERAPRED UNI-PAK 21 TAB) 5 MG (21) TBPK tablet SMARTSIG:- Tablet(s) By Mouth - (Patient not taking: Reported on 11/29/2023)     Baclofen 5 MG TABS Take 1 tablet (5 mg total) by mouth at bedtime as needed. 15 tablet 0   predniSONE (DELTASONE) 20 MG tablet Take 2 tablets (40 mg total) by mouth daily. 10 tablet 0   No facility-administered medications prior to visit.     Per HPI unless specifically indicated in ROS section below Review of Systems  Objective:  BP 128/66   Pulse 71   Temp 98.4 F (36.9 C) (Oral)   Ht 5' 1.5" (1.562 m)   Wt 142 lb (64.4 kg)   SpO2 98%   BMI 26.40 kg/m   Wt Readings from Last 3 Encounters:  11/29/23 142 lb (64.4 kg)  11/15/23 142 lb (64.4 kg)  10/01/23 142 lb (64.4 kg)      Physical Exam Vitals and nursing note reviewed.  Constitutional:      Appearance: Normal appearance. She is not ill-appearing.  HENT:     Head: Normocephalic and atraumatic.     Right Ear: Tympanic membrane, ear canal and external ear normal. There is no impacted  cerumen.     Left Ear: Tympanic membrane, ear canal and external ear normal. There is no impacted cerumen.     Mouth/Throat:     Mouth: Mucous membranes are moist.     Pharynx: Oropharynx is clear. No oropharyngeal exudate or posterior oropharyngeal erythema.  Eyes:     General:        Right eye: No discharge.        Left eye: No discharge.     Extraocular Movements: Extraocular movements intact.     Conjunctiva/sclera: Conjunctivae normal.  Pupils: Pupils are equal, round, and reactive to light.  Neck:     Thyroid: Thyroid mass (R sided nodule) present. No thyromegaly or thyroid tenderness.     Vascular: No carotid bruit.  Cardiovascular:     Rate and Rhythm: Normal rate and regular rhythm.     Pulses: Normal pulses.     Heart sounds: Normal heart sounds. No murmur heard. Pulmonary:     Effort: Pulmonary effort is normal. No respiratory distress.     Breath sounds: Normal breath sounds. No wheezing, rhonchi or rales.  Abdominal:     General: Bowel sounds are normal. There is no distension.     Palpations: Abdomen is soft. There is no mass.     Tenderness: There is no abdominal tenderness. There is no guarding or rebound.     Hernia: No hernia is present.  Musculoskeletal:     Cervical back: Normal range of motion and neck supple. No rigidity.     Right lower leg: No edema.     Left lower leg: No edema.  Lymphadenopathy:     Cervical: No cervical adenopathy.  Skin:    General: Skin is warm and dry.     Findings: No rash.  Neurological:     General: No focal deficit present.     Mental Status: She is alert. Mental status is at baseline.  Psychiatric:        Mood and Affect: Mood normal.        Behavior: Behavior normal.       Results for orders placed or performed in visit on 11/22/23  CBC with Differential/Platelet   Collection Time: 11/22/23  7:59 AM  Result Value Ref Range   WBC 6.6 4.0 - 10.5 K/uL   RBC 4.45 3.87 - 5.11 Mil/uL   Hemoglobin 13.6 12.0 - 15.0  g/dL   HCT 65.7 84.6 - 96.2 %   MCV 91.3 78.0 - 100.0 fl   MCHC 33.5 30.0 - 36.0 g/dL   RDW 95.2 84.1 - 32.4 %   Platelets 219.0 150.0 - 400.0 K/uL   Neutrophils Relative % 62.0 43.0 - 77.0 %   Lymphocytes Relative 25.3 12.0 - 46.0 %   Monocytes Relative 8.0 3.0 - 12.0 %   Eosinophils Relative 4.0 0.0 - 5.0 %   Basophils Relative 0.7 0.0 - 3.0 %   Neutro Abs 4.1 1.4 - 7.7 K/uL   Lymphs Abs 1.7 0.7 - 4.0 K/uL   Monocytes Absolute 0.5 0.1 - 1.0 K/uL   Eosinophils Absolute 0.3 0.0 - 0.7 K/uL   Basophils Absolute 0.0 0.0 - 0.1 K/uL  VITAMIN D 25 Hydroxy (Vit-D Deficiency, Fractures)   Collection Time: 11/22/23  7:59 AM  Result Value Ref Range   VITD 46.00 30.00 - 100.00 ng/mL  Vitamin B12   Collection Time: 11/22/23  7:59 AM  Result Value Ref Range   Vitamin B-12 615 211 - 911 pg/mL  TSH   Collection Time: 11/22/23  7:59 AM  Result Value Ref Range   TSH 5.15 0.35 - 5.50 uIU/mL  Hemoglobin A1c   Collection Time: 11/22/23  7:59 AM  Result Value Ref Range   Hgb A1c MFr Bld 6.2 4.6 - 6.5 %  Comprehensive metabolic panel   Collection Time: 11/22/23  7:59 AM  Result Value Ref Range   Sodium 145 135 - 145 mEq/L   Potassium 4.2 3.5 - 5.1 mEq/L   Chloride 107 96 - 112 mEq/L   CO2 29 19 - 32  mEq/L   Glucose, Bld 105 (H) 70 - 99 mg/dL   BUN 16 6 - 23 mg/dL   Creatinine, Ser 2.70 0.40 - 1.20 mg/dL   Total Bilirubin 0.3 0.2 - 1.2 mg/dL   Alkaline Phosphatase 96 39 - 117 U/L   AST 15 0 - 37 U/L   ALT 10 0 - 35 U/L   Total Protein 7.3 6.0 - 8.3 g/dL   Albumin 4.3 3.5 - 5.2 g/dL   GFR 62.37 >62.83 mL/min   Calcium 9.3 8.4 - 10.5 mg/dL  Lipid panel   Collection Time: 11/22/23  7:59 AM  Result Value Ref Range   Cholesterol 154 0 - 200 mg/dL   Triglycerides 151.7 0.0 - 149.0 mg/dL   HDL 61.60 >73.71 mg/dL   VLDL 06.2 0.0 - 69.4 mg/dL   LDL Cholesterol 83 0 - 99 mg/dL   Total CHOL/HDL Ratio 3    NonHDL 104.87     Assessment & Plan:   Problem List Items Addressed This Visit      Advanced care planning/counseling discussion - Primary (Chronic)   Previously discussed  Encouraged she bring [place] copy of HCPOA form.       MDD (major depressive disorder), recurrent episode, moderate (HCC)   Chronic, stable period on sertraline 100mg  daily - continue this.       Relevant Medications   sertraline (ZOLOFT) 100 MG tablet   Osteoarthritis   Appreciate Ortho and PM&R care.      Relevant Medications   predniSONE (STERAPRED UNI-PAK 21 TAB) 5 MG (21) TBPK tablet   acetaminophen (TYLENOL) 500 MG tablet   Hypertension   Chronic, stable on amlodipine 10mg  daily. Suggested dropping dose to 1/2 tab if low BP symptoms develop.       Relevant Medications   amLODipine (NORVASC) 10 MG tablet   atorvastatin (LIPITOR) 20 MG tablet   Right thyroid nodule   S/p benign biopsy 08/2014  Update thyroid US with new swallow difficulty       Relevant Medications   levothyroxine (SYNTHROID) 75 MCG tablet   Other Relevant Orders   US THYROID   Prediabetes   Encouraged limiting added sugar in diet.      HLD (hyperlipidemia)   Chronic, stable on atorvastatin-continue. The 10-year ASCVD risk score (Arnett DK, et al., 2019) is: 20.6%   Values used to calculate the score:     Age: 23 years     Sex: Female     Is Non-Hispanic African American: No     Diabetic: No     Tobacco smoker: No     Systolic Blood Pressure: 128 mmHg     Is BP treated: Yes     HDL Cholesterol: 48.8 mg/dL     Total Cholesterol: 154 mg/dL      Relevant Medications   amLODipine (NORVASC) 10 MG tablet   atorvastatin (LIPITOR) 20 MG tablet   Weight loss   Ongoing, patient endorses intentional weight loss. Continue to monitor closely, 67-month follow-up in office. Will also order barium swallow for endorsed dysphagia.      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  Will request latest ENT records.       Hypothyroidism   Chronic, stable levothyroxine 75 mcg daily..  TSH trending up, advised monitor for hypothyroid symptoms and let us know if these develop.      Relevant Medications  levothyroxine (SYNTHROID) 75 MCG tablet   Mixed urge and stress incontinence   Stable.  On oxybutynin XL 5 mg nightly with benefit.  She does note dry mouth side effect to this medicine.      Relevant Medications   oxybutynin (DITROPAN-XL) 5 MG 24 hr tablet   GERD (gastroesophageal reflux disease)   Gastritis on latest endoscopy 2020. Continue omeprazole 40 mg daily.      Relevant Medications   omeprazole (PRILOSEC) 40 MG capsule   Osteopenia   Will order updated DEXA.      Relevant Orders   DG Bone Density   Low serum vitamin B12   Level stable on B12 replacement.      Vitamin D deficiency   Level stable on vitamin D replacement.      Dysphagia   Endorses ongoing intermittent difficulty with solid foods as well as weight loss noted-will check barium swallow.  Low threshold to refer to speech therapy for modified barium swallow.      Relevant Orders   DG ESOPHAGUS W DOUBLE CM (HD)   Other Visit Diagnoses       Estrogen deficiency       Relevant Orders   DG Bone Density        Meds ordered this encounter  Medications   amLODipine (NORVASC) 10 MG tablet    Sig: Take 1 tablet (10 mg total) by mouth daily.    Dispense:  90 tablet    Refill:  4   atorvastatin (LIPITOR) 20 MG tablet    Sig: Take 1 tablet (20 mg total) by mouth daily.    Dispense:  90 tablet    Refill:  4   levothyroxine (SYNTHROID) 75 MCG tablet    Sig: Take 1 tablet (75 mcg total) by mouth every morning.    Dispense:  90 tablet    Refill:  4   omeprazole (PRILOSEC) 40 MG capsule    Sig: Take 1 capsule (40 mg total) by mouth every morning.    Dispense:  90 capsule    Refill:  4   sertraline (ZOLOFT) 100 MG tablet    Sig: Take 1 tablet (100 mg total) by mouth every morning.    Dispense:  90 tablet    Refill:   4   oxybutynin (DITROPAN-XL) 5 MG 24 hr tablet    Sig: Take 1 tablet (5 mg total) by mouth at bedtime.    Dispense:  90 tablet    Refill:  4   acetaminophen (TYLENOL) 500 MG tablet    Sig: Take 1 tablet (500 mg total) by mouth at bedtime.    Orders Placed This Encounter  Procedures   DG ESOPHAGUS W DOUBLE CM (HD)    Standing Status:   Future    Expiration Date:   11/28/2024    Reason for Exam (SYMPTOM  OR DIAGNOSIS REQUIRED):   dysphagia    Preferred imaging location?:   [facility]    Radiology Contrast Protocol - do NOT remove file path:   [email or web address]   US THYROID    Standing Status:   Future    Expiration Date:   11/28/2024    Reason for Exam (SYMPTOM  OR DIAGNOSIS REQUIRED):   R thyroid nodule f/u    Preferred imaging location?:   [facility]   DG Bone Density    Standing Status:   Future    Expiration Date:   11/28/2024    Reason for Exam (SYMPTOM  OR DIAGNOSIS REQUIRED):  osteopenia    Preferred imaging location?:   [facility]    Patient Instructions  Continue monitoring blood pressures at home and if consistently low drop amlodipine to 1/2 pill daily - let me know how you're doing.  We will request latest records from [facility].  I will order swallow study and thyroid ultrasound at [facility] outpatient imaging center.  Call to schedule mammogram and bone density scan at your convenience: [facility] [phone].  Bring [place] copy of your latest living will/advanced directive  Good to see you today  Return as needed or in 3 months for follow up visit   Follow up plan: Return in about 3 months (around 02/27/2024) for follow up visit.  Eustaquio Boyden, MD

## 2023-11-29 NOTE — Assessment & Plan Note (Addendum)
Previously discussed  Encouraged she bring [place] copy of HCPOA form.

## 2023-11-29 NOTE — Assessment & Plan Note (Signed)
Level stable on B12 replacement.

## 2023-11-29 NOTE — Assessment & Plan Note (Signed)
Stable.  On oxybutynin XL 5 mg nightly with benefit.  She does note dry mouth side effect to this medicine.

## 2023-11-29 NOTE — Patient Instructions (Addendum)
Continue monitoring blood pressures at home and if consistently low drop amlodipine to 1/2 pill daily - let me know how you're doing.  We will request latest records from [facility].  I will order swallow study and thyroid ultrasound at [facility] outpatient imaging center.  Call to schedule mammogram and bone density scan at your convenience: [facility] at [facility] [phone].  Bring [place] copy of your latest living will/advanced directive  Good to see you today  Return as needed or in 3 months for follow up visit

## 2023-11-29 NOTE — Assessment & Plan Note (Signed)
S/p benign biopsy 08/2014  Update thyroid US with new swallow difficulty

## 2023-12-02 ENCOUNTER — Other Ambulatory Visit: Payer: Self-pay | Admitting: Family Medicine

## 2023-12-02 NOTE — Telephone Encounter (Signed)
 Refill request for GABAPENTIN 100 MG CAPS 100 Capsule   LOV - 11/29/23 Next OV - 02/28/24 Last refill - no longer on patients med list

## 2023-12-03 ENCOUNTER — Encounter: Payer: Self-pay | Admitting: *Deleted

## 2023-12-03 NOTE — Telephone Encounter (Signed)
 ERx

## 2023-12-07 ENCOUNTER — Other Ambulatory Visit: Payer: Self-pay | Admitting: Family Medicine

## 2023-12-07 DIAGNOSIS — Z1231 Encounter for screening mammogram for malignant neoplasm of breast: Secondary | ICD-10-CM

## 2023-12-09 ENCOUNTER — Ambulatory Visit
Admission: RE | Admit: 2023-12-09 | Discharge: 2023-12-09 | Disposition: A | Discharge status: Home / Self Care | Payer: Medicare Other | Source: Ambulatory Visit | Attending: Family Medicine | Admitting: Family Medicine

## 2023-12-09 DIAGNOSIS — E041 Nontoxic single thyroid nodule: Secondary | ICD-10-CM | POA: Diagnosis not present

## 2023-12-09 DIAGNOSIS — E042 Nontoxic multinodular goiter: Secondary | ICD-10-CM | POA: Diagnosis not present

## 2023-12-16 DIAGNOSIS — M48062 Spinal stenosis, lumbar region with neurogenic claudication: Secondary | ICD-10-CM | POA: Diagnosis not present

## 2023-12-16 DIAGNOSIS — M5416 Radiculopathy, lumbar region: Secondary | ICD-10-CM | POA: Diagnosis not present

## 2023-12-17 ENCOUNTER — Other Ambulatory Visit: Payer: Self-pay | Admitting: Family Medicine

## 2023-12-17 ENCOUNTER — Other Ambulatory Visit: Payer: Self-pay | Admitting: Nurse Practitioner

## 2023-12-17 DIAGNOSIS — R051 Acute cough: Secondary | ICD-10-CM

## 2023-12-17 DIAGNOSIS — F331 Major depressive disorder, recurrent, moderate: Secondary | ICD-10-CM

## 2023-12-20 ENCOUNTER — Ambulatory Visit
Admission: RE | Admit: 2023-12-20 | Discharge: 2023-12-20 | Disposition: A | Discharge status: Home / Self Care | Payer: Medicare Other | Source: Ambulatory Visit | Attending: Family Medicine | Admitting: Family Medicine

## 2023-12-20 DIAGNOSIS — R1310 Dysphagia, unspecified: Secondary | ICD-10-CM | POA: Diagnosis not present

## 2023-12-20 NOTE — Telephone Encounter (Signed)
ERx 

## 2023-12-27 DIAGNOSIS — M47816 Spondylosis without myelopathy or radiculopathy, lumbar region: Secondary | ICD-10-CM | POA: Diagnosis not present

## 2023-12-27 DIAGNOSIS — Z6826 Body mass index (BMI) 26.0-26.9, adult: Secondary | ICD-10-CM | POA: Diagnosis not present

## 2023-12-27 DIAGNOSIS — M48062 Spinal stenosis, lumbar region with neurogenic claudication: Secondary | ICD-10-CM | POA: Diagnosis not present

## 2023-12-29 ENCOUNTER — Other Ambulatory Visit: Payer: Self-pay | Admitting: Family Medicine

## 2023-12-29 ENCOUNTER — Telehealth: Payer: Self-pay | Admitting: Family Medicine

## 2023-12-29 DIAGNOSIS — L814 Other melanin hyperpigmentation: Secondary | ICD-10-CM | POA: Diagnosis not present

## 2023-12-29 DIAGNOSIS — R1310 Dysphagia, unspecified: Secondary | ICD-10-CM

## 2023-12-29 DIAGNOSIS — L570 Actinic keratosis: Secondary | ICD-10-CM | POA: Diagnosis not present

## 2023-12-29 DIAGNOSIS — Z85828 Personal history of other malignant neoplasm of skin: Secondary | ICD-10-CM | POA: Diagnosis not present

## 2023-12-29 DIAGNOSIS — Z08 Encounter for follow-up examination after completed treatment for malignant neoplasm: Secondary | ICD-10-CM | POA: Diagnosis not present

## 2023-12-29 DIAGNOSIS — D225 Melanocytic nevi of trunk: Secondary | ICD-10-CM | POA: Diagnosis not present

## 2023-12-29 DIAGNOSIS — L821 Other seborrheic keratosis: Secondary | ICD-10-CM | POA: Diagnosis not present

## 2023-12-29 NOTE — Telephone Encounter (Signed)
Spoke with Speech Therapy office notifying them the order is for speech pathology. Verbalizes understanding and will document.

## 2023-12-29 NOTE — Telephone Encounter (Signed)
Copied from CRM [id number]. Topic: Referral - Question >> Dec 29, 2023  9:27 AM Drema Balzarine wrote: Reason for CRM: Speech Therapy office confirming if patient is being referred for swallow study or speech treatment? If patient is being referred for swallow study please update order with diagnosis so medicare can cover and if patient needs speech treatment please call office at [phone].

## 2024-01-04 ENCOUNTER — Ambulatory Visit: Payer: Medicare Other | Attending: Family Medicine | Admitting: Speech Pathology

## 2024-01-04 DIAGNOSIS — R1310 Dysphagia, unspecified: Secondary | ICD-10-CM | POA: Insufficient documentation

## 2024-01-05 NOTE — Therapy (Signed)
 OUTPATIENT SPEECH LANGUAGE PATHOLOGY  SWALLOW EVALUATION   Patient Name: Donna Vargas MRN: [id number] DOB:Jan 11, 1946, 78 y.o., female Today's Date: 01/05/2024  PCP: Anton Blas, MD REFERRING PROVIDER: Anton Blas, MD   End of Session - 01/05/24 2015     Visit Number 1    Number of Visits 17    Date for SLP Re-Evaluation 02/29/24    Authorization Type Medicare Part A [place] Part B    Progress Note Due on Visit 10    SLP Start Time 1315    SLP Stop Time  1400    SLP Time Calculation (min) 45 min    Activity Tolerance Patient tolerated treatment well             Past Medical History:  Diagnosis Date   Abnormal drug screen 05/2016   inapprop negative klonopin  rpt 3 mo (05/2016)   Arthritis    fingers   COVID-19 virus infection 10/2019   Depression with anxiety    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   History of asthma 1990s   with bronchitis   History of diverticulitis    ?IBS   Hypertension    Hypothyroidism 2015   ?by prior PCP   Osteopenia 10/19/2019   DEXA 10/2019 T -1.2 hip (osteopenia)   Perennial allergic rhinitis    allergy shots (sharma)   Prediabetes 06/2014   Right thyroid  nodule 09/11/2014   S/p biopsy showing benign colloid nodule (08/2014)    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 CA125 level elevated - but no cancer   Patient Active Problem List   Diagnosis Date Noted   Dysphagia 11/29/2023   Post-nasal drainage 10/06/2022   Lumbar spondylosis 02/11/2022    Pain in right thigh 01/08/2022   Nasal congestion 01/08/2022   Varicose veins of bilateral lower extremities with pain 02/17/2021   Vitamin D  deficiency 08/19/2020   Low serum vitamin B12 06/12/2020   Chronic fatigue 06/12/2020   Tinnitus 11/21/2019   Osteopenia 10/19/2019   Carotid stenosis 08/09/2019   Lumbar herniated disc 07/19/2019   GERD (gastroesophageal reflux disease) 01/11/2019   Lumbar scoliosis 12/10/2018   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   Palpitations 09/06/2018   Abdominal bloating 12/06/2017   Hypothyroidism 08/18/2017   Hearing loss due to cerumen impaction 07/09/2017   Abnormal drug screen 05/30/2016   HLD (hyperlipidemia) 10/17/2015   Weight loss 10/17/2015   Medicare annual wellness visit, subsequent 09/11/2014   Advanced care planning/counseling discussion 09/11/2014   Right thyroid  nodule 09/11/2014   Pain in lateral portion of right knee 08/31/2014   Surgical menopause 08/21/2014   Perennial allergic rhinitis    MDD (major depressive disorder), recurrent episode, moderate (HCC)    Osteoarthritis    Hypertension    Prediabetes 06/30/2014    ONSET DATE: 12/29/2023 date of referral   REFERRING DIAG: R13.10 (ICD-10-CM) - Dysphagia, unspecified type  THERAPY DIAG:  Dysphagia, unspecified type  Rationale for Evaluation and Treatment Rehabilitation  SUBJECTIVE:   SUBJECTIVE STATEMENT: Pt fair historian, reports effort when swallowing Pt accompanied by: self  PERTINENT HISTORY: Pt is a 78 year old female referred d/t pt report of dysphagia.SABRA   DIAGNOSTIC FINDINGS:    11/07/2008 - EGD Vilinda Hint) Patchy mild inflammation characterized by erythema and granularity was found in the gastric antrum. Biopsies were taken with a cold forceps for Helicobacter pylori testing   12/09/2023 US  Thyroid  FINDINGS: Parenchymal Echotexture: Markedly heterogenous   Isthmus:  Normal in size measuring 0.2 cm in diameter, unchanged   Right lobe: Atrophic measuring 3.6 x 1.7 x 1.5 cm, previously, 4.0 x 1.5 x 0.9 cm   Left lobe: Normal in size measuring 4.4 x 1.9 x 1.1 cm, previously, 5.0 x 1.51.3 cm  12/20/2023 -  DG Esophagus FINDINGS: Swallowing: Difficulty with initiating swallow with spontaneous chin-tuck. No vestibular penetration or aspiration seen.   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   Esophagus: Normal appearance.  No mucosal lesions or strictures.   Esophageal motility: Brief tertiary contraction in the mid esophagus and the distal esophagus. Some stasis of barium in the thoracic to upper esophagus.   Hiatal Hernia: No hiatal hernia seen.   Gastroesophageal reflux: No spontaneous gastroesophageal reflux and no reflux seen with cough or Valsalva.   Ingested 13mm barium tablet: Passed quickly from mouth to stomach without hesitation.  PAIN:  Are you having pain? No  FALLS: Has patient fallen in last 6 months?  No  LIVING ENVIRONMENT: Lives with: lives alone Lives in: House/apartment  PLOF:  Level of assistance: Independent with ADLs Employment: Retired   PATIENT GOALS  to have swallowing assessed  OBJECTIVE:  COGNITION: Overall cognitive status: Within functional limits for tasks assessed  ORAL MOTOR EXAMINATION Facial : WFL Lingual: WFL Velum: WFL Mandible: WFL Cough: WFL Voice: WFL   CLINICAL SWALLOW ASSESSMENT:   Current diet: regular and thin liquids Dentition: adequate natural dentition Feeding: able to feed self Consistencies tested: Thin Liquid: Presentation: Cup, Straw, and Self-fed Oral Phase: WFL Pharyngeal Phase: WFL Puree: Presentation: Spoon and Self-fed Oral Phase: WFL Pharyngeal Phase: WFL Regular: Presentation: By hand and Self-fed Oral Phase: WFL Pharyngeal Phase: WFL Other: mixed consistency fruit cocktail Presentation: Spoon and By hand Oral Phase: WFL Pharyngeal Phase: WFL   Evaluation  findings: Patient presents with oropharyngeal swallow which appears clinically to be within functional limits with adequate airway protection. Oral stage is characterized by appearance of adequate oral containment, mastication, bolus formation, oral transfer and oral clearance. Swallow initiation appears timely. No overt signs of aspiration observed despite challenging with consecutive straw sips of thin liquids in excess of 3oz.  Aspiration risk factors:History of GERD and History of esophageal-related issues Overall aspiration risk:Mild Diet Recommendations: regular and thin liquids Precautions:Minimize environmental distractions, Slow rate, Small sips/bites, Seated upright 90 degrees, and Remain upright for at least 30 minutes after meals Supervision: Patient able to feed self Oral care recommendations:Oral care BID Follow-up recommendations: Therapy as outlined in treatment plan below, Defer until completion of instrumental exam, Recommend GI referral, and Consider esophageal assessment     PATIENT REPORTED OUTCOME MEASURES (PROM): To be completed over the ext 3 sessions  TODAY'S TREATMENT:  Skilled verbal and written information provided on aspiration and reflux precautions as well as swallow strategies. Results of EGD and potential need for referral to GI to further assess CP bar  In addition, when consuming POs during this assessment, pt was  intermittently observed briefly holding liquids prior to swallow which could be related to potential esophageal dysfunction. Education provided to monitor oral hold to reduce risk of aspiration.    PATIENT EDUCATION: Education details: see above Person educated: Patient Education method: Explanation Education comprehension: verbalized understanding  HOME EXERCISE PROGRAM:     N/A   GOALS: Goals reviewed with patient? Yes  SHORT TERM GOALS: Target date: 10 sessions  Patient will participate in objective swallowing evaluation (MBSS) to  identify safest diet recommendation as well as therapeutic targets.  Baseline: Goal status: INITIAL   LONG TERM GOALS: Target date: 02/29/2024  With Supervision A, pt will demonstrate understanding of result and recommendations of MBS by verbalizing salient points.  Baseline:  Goal status: INITIAL  ASSESSMENT:  CLINICAL IMPRESSION: Patient is a 78 y.o. female who was seen today for a clinical swallow evaluation d/t report of difficulty of initiating swallow during recent DG esophagus. During this assessment, pt with functional appearance of effortful swallow and report of resistance pointing to the left side of neck when consuming puree, crackers and mixed consistencies. Further review of recent DG esophagus reveals mention of CP bar. Possible sensation of bolus opposition given bar. Would need a Modified Barium to assess potential impact on food passage.   OBJECTIVE IMPAIRMENTS include dysphagia. These impairments are limiting patient from safety when swallowing. Factors affecting potential to achieve goals and functional outcome are co-morbidities. Patient will benefit from skilled SLP services to address above impairments and improve overall function.  REHAB POTENTIAL: Good  PLAN: SLP FREQUENCY: 1-2x/week  SLP DURATION: 8 weeks  PLANNED INTERVENTIONS: Aspiration precaution training, Pharyngeal strengthening exercises, Diet toleration management , SLP instruction and feedback, Compensatory strategies, and Patient/family education    Anjeanette Petzold B. Rubbie, M.S., CCC-SLP, Tree Surgeon Certified Brain Injury Specialist [facility]  [facility] Office [phone] Ascom [phone] Fax [phone]

## 2024-01-07 NOTE — Telephone Encounter (Signed)
 Patient had swallow test 12/20/2023  Nothing further needed.

## 2024-01-11 ENCOUNTER — Ambulatory Visit: Payer: Medicare Other | Admitting: Speech Pathology

## 2024-01-11 ENCOUNTER — Telehealth: Payer: Self-pay | Admitting: Family Medicine

## 2024-01-11 DIAGNOSIS — R1310 Dysphagia, unspecified: Secondary | ICD-10-CM

## 2024-01-11 DIAGNOSIS — R633 Feeding difficulties, unspecified: Secondary | ICD-10-CM

## 2024-01-11 NOTE — Telephone Encounter (Signed)
Received message from ST.  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 Will further evaluate with modified barium swallow.

## 2024-01-12 ENCOUNTER — Ambulatory Visit: Payer: Medicare Other | Admitting: Speech Pathology

## 2024-01-18 ENCOUNTER — Ambulatory Visit: Payer: Medicare Other | Admitting: Speech Pathology

## 2024-01-25 ENCOUNTER — Ambulatory Visit: Payer: Medicare Other | Admitting: Speech Pathology

## 2024-02-02 ENCOUNTER — Ambulatory Visit: Payer: Medicare Other | Admitting: Speech Pathology

## 2024-02-07 ENCOUNTER — Other Ambulatory Visit: Payer: Medicare Other

## 2024-02-16 ENCOUNTER — Ambulatory Visit
Admission: RE | Admit: 2024-02-16 | Discharge: 2024-02-16 | Disposition: A | Discharge status: Home / Self Care | Source: Ambulatory Visit | Attending: Family Medicine | Admitting: Family Medicine

## 2024-02-16 DIAGNOSIS — Z1231 Encounter for screening mammogram for malignant neoplasm of breast: Secondary | ICD-10-CM | POA: Insufficient documentation

## 2024-02-28 ENCOUNTER — Encounter: Payer: Self-pay | Admitting: Family Medicine

## 2024-02-28 ENCOUNTER — Ambulatory Visit (INDEPENDENT_AMBULATORY_CARE_PROVIDER_SITE_OTHER): Payer: Medicare Other | Admitting: Family Medicine

## 2024-02-28 VITALS — BP 122/68 | HR 72 | Temp 98.3°F | Ht 61.5 in | Wt 147.1 lb

## 2024-02-28 DIAGNOSIS — K219 Gastro-esophageal reflux disease without esophagitis: Secondary | ICD-10-CM

## 2024-02-28 DIAGNOSIS — G8929 Other chronic pain: Secondary | ICD-10-CM

## 2024-02-28 DIAGNOSIS — M5441 Lumbago with sciatica, right side: Secondary | ICD-10-CM

## 2024-02-28 DIAGNOSIS — R1310 Dysphagia, unspecified: Secondary | ICD-10-CM | POA: Diagnosis not present

## 2024-02-28 DIAGNOSIS — M5442 Lumbago with sciatica, left side: Secondary | ICD-10-CM | POA: Diagnosis not present

## 2024-02-28 DIAGNOSIS — F331 Major depressive disorder, recurrent, moderate: Secondary | ICD-10-CM | POA: Diagnosis not present

## 2024-02-28 DIAGNOSIS — Z7189 Other specified counseling: Secondary | ICD-10-CM

## 2024-02-28 NOTE — Assessment & Plan Note (Signed)
She continues omeprazole 40mg  daily.

## 2024-02-28 NOTE — Addendum Note (Signed)
 Addended by: Eustaquio Boyden on: 02/28/2024 03:35 PM   Modules accepted: Level of Service

## 2024-02-28 NOTE — Assessment & Plan Note (Signed)
 Advanced directive: living will scanned 01/2015, brings updated declaration of desire for natural death - does not want prolonged life support if terminal condition. Will check on HCPOA forms at home.

## 2024-02-28 NOTE — Assessment & Plan Note (Addendum)
 Has seen speech therapist.  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  Considering scheduling MBS vs ENT eval.

## 2024-02-28 NOTE — Assessment & Plan Note (Signed)
 Chronic, overall stable period on sertraline 100mg  daily - continue this.

## 2024-02-28 NOTE — Patient Instructions (Addendum)
 Check at home on advanced directive, health care power of attorney form.  Follow up with [facility] speech therapist about swallow study.  Good to see you today.  Return as needed or in 5 months for follow up visit

## 2024-02-28 NOTE — Progress Notes (Signed)
 Ph: [phone] Fax: [phone]   Patient ID: Donna Vargas, female    DOB: 1946-10-15, 78 y.o.   MRN: [id number]  This visit was conducted in person.  BP 122/68   Pulse 72   Temp 98.3 F (36.8 C) (Oral)   Ht 5' 1.5" (1.562 m)   Wt 147 lb 2 oz (66.7 kg)   SpO2 96%   BMI 27.35 kg/m    CC: 3 mo f/u visit  Subjective:   HPI: Donna Vargas is a 78 y.o. female presenting on 02/28/2024 for Medical Management of Chronic Issues (Here for 3 mo f/u.)   ESOPHAGOGASTRODUODENOSCOPY 01/2019 - gastritis, o/w normal ([facility])   GERD - managed with omeprazole 40mg  daily.   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  Needs to schedule MBS.   Thyroid US 12/2023 - overall benign appearing ultrasound with stable benign nodules.   Advanced directive: living will scanned 01/2015, brings updated declaration of desire for natural death - does not want prolonged life support if terminal condition. Will check on HCPOA forms at home.   Ongoing lower back pain - planning to see back doctor in f/u for pinched nerves.   Noted increased daytime somnolence ie at [employer]. No increased napping.  Notes restorative sleep.  Some snoring. No witnessed apneic episodes, PNDyspnea, morning headaches.      Relevant past medical, surgical, family and social history reviewed and updated as indicated. Interim medical history since our last visit reviewed. Allergies and medications reviewed and updated. Outpatient Medications Prior to Visit  Medication Sig Dispense Refill   acetaminophen (TYLENOL) 500 MG tablet Take 1 tablet (500 mg total) by mouth at bedtime.     ALLERGY RELIEF CETIRIZINE 10 MG tablet TAKE 1 TABLET BY MOUTH EVERY DAY AT BEDTIME 30 tablet 10   amLODipine (NORVASC) 10 MG tablet Take 1 tablet (10 mg total) by mouth daily. 90 tablet 4   atorvastatin (LIPITOR) 20 MG tablet Take 1 tablet (20 mg  total) by mouth daily. 90 tablet 4   Cholecalciferol (VITAMIN D3) 25 MCG (1000 UT) CAPS Take 2 capsules (2,000 Units total) by mouth daily.     clonazePAM (KLONOPIN) 0.5 MG tablet TAKE 1 TABLET BY MOUTH EVERY DAY AT BEDTIME 30 tablet 2   cyanocobalamin (V-R VITAMIN B-12) 500 MCG tablet Take 1 tablet (500 mcg total) by mouth every Monday, Wednesday, and Friday.     fluticasone (FLONASE) 50 MCG/ACT nasal spray Place 2 sprays into both nostrils daily. (Patient taking differently: Place 2 sprays into both nostrils daily. As needed) 16 g 2   levothyroxine (SYNTHROID) 75 MCG tablet Take 1 tablet (75 mcg total) by mouth every morning. 90 tablet 4   omeprazole (PRILOSEC) 40 MG capsule Take 1 capsule (40 mg total) by mouth every morning. 90 capsule 4   oxybutynin (DITROPAN-XL) 5 MG 24 hr tablet Take 1 tablet (5 mg total) by mouth at bedtime. 90 tablet 4   sertraline (ZOLOFT) 100 MG tablet Take 1 tablet (100 mg total) by mouth every morning. 90 tablet 4   traMADol (ULTRAM) 50 MG tablet Take 1 tablet 3 times a day by oral route as needed for pain for 10 days.     gabapentin (NEURONTIN) 100 MG capsule TAKE 1 CAPSULE BY MOUTH TWICE DAILY AS NEEDED 60 capsule 3   predniSONE (STERAPRED UNI-PAK 21 TAB) 5 MG (21) TBPK tablet  No facility-administered medications prior to visit.     Per HPI unless specifically indicated in ROS section below Review of Systems  Objective:  BP 122/68   Pulse 72   Temp 98.3 F (36.8 C) (Oral)   Ht 5' 1.5" (1.562 m)   Wt 147 lb 2 oz (66.7 kg)   SpO2 96%   BMI 27.35 kg/m   Wt Readings from Last 3 Encounters:  02/28/24 147 lb 2 oz (66.7 kg)  11/29/23 142 lb (64.4 kg)  11/15/23 142 lb (64.4 kg)      Physical Exam Vitals and nursing note reviewed.  Constitutional:      Appearance: Normal appearance. She is not ill-appearing.  HENT:     Mouth/Throat:     Mouth: Mucous membranes are moist.     Pharynx: Oropharynx is clear. No oropharyngeal exudate or posterior  oropharyngeal erythema.  Eyes:     Extraocular Movements: Extraocular movements intact.     Conjunctiva/sclera: Conjunctivae normal.     Pupils: Pupils are equal, round, and reactive to light.  Cardiovascular:     Rate and Rhythm: Normal rate and regular rhythm.     Pulses: Normal pulses.     Heart sounds: Normal heart sounds. No murmur heard. Pulmonary:     Effort: Pulmonary effort is normal. No respiratory distress.     Breath sounds: Normal breath sounds. No wheezing, rhonchi or rales.  Musculoskeletal:     Right lower leg: No edema.     Left lower leg: No edema.  Skin:    General: Skin is warm and dry.     Findings: No rash.  Neurological:     Mental Status: She is alert.  Psychiatric:        Mood and Affect: Mood normal.        Behavior: Behavior normal.       Results for orders placed or performed in visit on 11/22/23  CBC with Differential/Platelet   Collection Time: 11/22/23  7:59 AM  Result Value Ref Range   WBC 6.6 4.0 - 10.5 K/uL   RBC 4.45 3.87 - 5.11 Mil/uL   Hemoglobin 13.6 12.0 - 15.0 g/dL   HCT 16.1 09.6 - 04.5 %   MCV 91.3 78.0 - 100.0 fl   MCHC 33.5 30.0 - 36.0 g/dL   RDW 40.9 81.1 - 91.4 %   Platelets 219.0 150.0 - 400.0 K/uL   Neutrophils Relative % 62.0 43.0 - 77.0 %   Lymphocytes Relative 25.3 12.0 - 46.0 %   Monocytes Relative 8.0 3.0 - 12.0 %   Eosinophils Relative 4.0 0.0 - 5.0 %   Basophils Relative 0.7 0.0 - 3.0 %   Neutro Abs 4.1 1.4 - 7.7 K/uL   Lymphs Abs 1.7 0.7 - 4.0 K/uL   Monocytes Absolute 0.5 0.1 - 1.0 K/uL   Eosinophils Absolute 0.3 0.0 - 0.7 K/uL   Basophils Absolute 0.0 0.0 - 0.1 K/uL  VITAMIN D 25 Hydroxy (Vit-D Deficiency, Fractures)   Collection Time: 11/22/23  7:59 AM  Result Value Ref Range   VITD 46.00 30.00 - 100.00 ng/mL  Vitamin B12   Collection Time: 11/22/23  7:59 AM  Result Value Ref Range   Vitamin B-12 615 211 - 911 pg/mL  TSH   Collection Time: 11/22/23  7:59 AM  Result Value Ref Range   TSH 5.15 0.35 -  5.50 uIU/mL  Hemoglobin A1c   Collection Time: 11/22/23  7:59 AM  Result Value Ref Range   Hgb A1c  MFr Bld 6.2 4.6 - 6.5 %  Comprehensive metabolic panel   Collection Time: 11/22/23  7:59 AM  Result Value Ref Range   Sodium 145 135 - 145 mEq/L   Potassium 4.2 3.5 - 5.1 mEq/L   Chloride 107 96 - 112 mEq/L   CO2 29 19 - 32 mEq/L   Glucose, Bld 105 (H) 70 - 99 mg/dL   BUN 16 6 - 23 mg/dL   Creatinine, Ser 0.34 0.40 - 1.20 mg/dL   Total Bilirubin 0.3 0.2 - 1.2 mg/dL   Alkaline Phosphatase 96 39 - 117 U/L   AST 15 0 - 37 U/L   ALT 10 0 - 35 U/L   Total Protein 7.3 6.0 - 8.3 g/dL   Albumin 4.3 3.5 - 5.2 g/dL   GFR 74.25 >95.63 mL/min   Calcium 9.3 8.4 - 10.5 mg/dL  Lipid panel   Collection Time: 11/22/23  7:59 AM  Result Value Ref Range   Cholesterol 154 0 - 200 mg/dL   Triglycerides 875.6 0.0 - 149.0 mg/dL   HDL 43.32 >95.18 mg/dL   VLDL 84.1 0.0 - 66.0 mg/dL   LDL Cholesterol 83 0 - 99 mg/dL   Total CHOL/HDL Ratio 3    NonHDL 104.87     Assessment & Plan:   Problem List Items Addressed This Visit     Advanced care planning/counseling discussion - Primary (Chronic)   Advanced directive: living will scanned 01/2015, brings updated declaration of desire for natural death - does not want prolonged life support if terminal condition. Will check on HCPOA forms at home.       MDD (major depressive disorder), recurrent episode, moderate (HCC)   Chronic, overall stable period on sertraline 100mg  daily - continue this.       Chronic bilateral low back pain with bilateral sciatica   Planning to return to her spine doctors.       GERD (gastroesophageal reflux disease)   She continues omeprazole 40mg  daily.       Dysphagia   Has seen speech therapist.  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  Considering scheduling MBS vs ENT eval.         No orders of the defined types were placed in this encounter.   No orders of the defined types  were placed in this encounter.   Patient Instructions  Check at home on advanced directive, health care power of attorney form.  Follow up with [facility] speech therapist about swallow study.  Good to see you today.  Return as needed or in 5 months for follow up visit   Follow up plan: Return in about 5 months (around 07/30/2024) for follow up visit.  Eustaquio Boyden, MD

## 2024-02-28 NOTE — Assessment & Plan Note (Signed)
 Planning to return to her spine doctors.

## 2024-03-14 ENCOUNTER — Ambulatory Visit
Admission: RE | Admit: 2024-03-14 | Discharge: 2024-03-14 | Disposition: A | Discharge status: Home / Self Care | Source: Ambulatory Visit | Attending: Family Medicine | Admitting: Family Medicine

## 2024-03-14 DIAGNOSIS — M8589 Other specified disorders of bone density and structure, multiple sites: Secondary | ICD-10-CM | POA: Diagnosis not present

## 2024-03-14 DIAGNOSIS — Z780 Asymptomatic menopausal state: Secondary | ICD-10-CM | POA: Diagnosis not present

## 2024-03-14 DIAGNOSIS — M85859 Other specified disorders of bone density and structure, unspecified thigh: Secondary | ICD-10-CM | POA: Insufficient documentation

## 2024-03-14 DIAGNOSIS — E2839 Other primary ovarian failure: Secondary | ICD-10-CM | POA: Diagnosis not present

## 2024-03-17 ENCOUNTER — Ambulatory Visit
Admission: RE | Admit: 2024-03-17 | Discharge: 2024-03-17 | Disposition: A | Discharge status: Home / Self Care | Source: Ambulatory Visit | Attending: Family Medicine | Admitting: Family Medicine

## 2024-03-17 DIAGNOSIS — R633 Feeding difficulties, unspecified: Secondary | ICD-10-CM | POA: Diagnosis not present

## 2024-03-17 DIAGNOSIS — R1310 Dysphagia, unspecified: Secondary | ICD-10-CM | POA: Diagnosis not present

## 2024-03-17 DIAGNOSIS — R053 Chronic cough: Secondary | ICD-10-CM | POA: Diagnosis not present

## 2024-03-17 NOTE — Procedures (Signed)
 Modified Barium Swallow Study  Patient Details  Name: Donna Vargas MRN: [id number] Date of Birth: 1946/07/11  Today's Date: 03/17/2024  Modified Barium Swallow completed.  Full report located under Chart Review in the Imaging Section.  History of Present Illness  Pt is a 78 year old female with past medical history of GERD and esophageal related issues.    Clinical Impression This Clinical research associate received phone call from pt regarding worsening swallow function with request to proceed with Modified Barium Swallow Study. Pt previously had a Clinical Swallow Evaluation with this writer on 01/04/2024.      Pt arrived to appointment with continuous coughing, throat clearing and sucking post-nasal drip and then coughing up mucus. When consuming thin water to help ease cough, pt was free of overt s/s of aspiration. She reports that she coughs like this "all day and I guess it is post-nasal drip, I don't know." This went for for ~ 30 minutes prior to the study.  It ceased during PO consumption during the study. She also reports recent inability to sing d/t report of aphonia.       During first trial, pt implemented chin tuck when swallowing. Pt unable to explain rationale for engaging in chin tuck when swallowing. Given that a chin tuck narrows the pharynx and creates a smaller passage way in whick to propel boluses thru, question if pt's swallow difficulties increased when she began implementing chin tuck. Pt unable to recall. Pt had difficulty breaking the habit of tucking her chin.       Pt presents with functional oropharyngeal abilities when consuming thin liquids, nectar thick liquids, puree, graham crackers coated with barium paste and whole barium tablet with thin liquids. Pt's oral phase was appropriate and her pharyngeal phase was timely and complete. Previously identified CP bar continues to be present but was not observed to be impeding bolus movement thru esophagus (even when consuming whole  barium tablet).       At this time, pt's risk of aspiration appears very reduced when consuming regular diet with thin liquids and medicine whole. Would recommend follow up with pt's ENT. Education and video review provided to pt. All questions answered to her satisfaction.  Factors that may increase risk of adverse event in presence of aspiration Roderick Civatte & Jessy Morocco 2021):  N/A  Swallow Evaluation Recommendations Recommendations: PO diet PO Diet Recommendation: Regular;Thin liquids (Level 0) Liquid Administration via: Cup Medication Administration: Whole meds with liquid Supervision: Patient able to self-feed Swallowing strategies  : Minimize environmental distractions;Slow rate;Small bites/sips Postural changes: Position pt fully upright for meals;Stay upright 30-60 min after meals Oral care recommendations: Oral care BID (2x/day) Recommended consults: Consider ENT consultation      Donna Vargas 03/17/2024,2:23 PM

## 2024-03-20 ENCOUNTER — Encounter: Payer: Self-pay | Admitting: Family Medicine

## 2024-03-20 DIAGNOSIS — M48062 Spinal stenosis, lumbar region with neurogenic claudication: Secondary | ICD-10-CM | POA: Diagnosis not present

## 2024-03-20 DIAGNOSIS — Z6827 Body mass index (BMI) 27.0-27.9, adult: Secondary | ICD-10-CM | POA: Diagnosis not present

## 2024-03-20 DIAGNOSIS — M47816 Spondylosis without myelopathy or radiculopathy, lumbar region: Secondary | ICD-10-CM | POA: Diagnosis not present

## 2024-03-22 ENCOUNTER — Other Ambulatory Visit: Payer: Self-pay | Admitting: Neurosurgery

## 2024-03-23 ENCOUNTER — Encounter (HOSPITAL_COMMUNITY): Payer: Self-pay

## 2024-03-23 NOTE — Progress Notes (Signed)
 Surgical Instructions   Your procedure is scheduled on Friday Mar 31, 2024. Report to [facility] Main Entrance "A" at 7:45 A.M., then check in with the Admitting office. Any questions or running late day of surgery: call [phone]  Questions prior to your surgery date: call [phone], Monday-Friday, 8am-4pm. If you experience any cold or flu symptoms such as cough, fever, chills, shortness of breath, etc. between now and your scheduled surgery, please notify us  at the above number.     Remember:  Do not eat or drink after midnight the night before your surgery  Take these medicines the morning of surgery with A SIP OF WATER  amLODipine  (NORVASC )  levothyroxine  (SYNTHROID )  omeprazole  (PRILOSEC)  sertraline  (ZOLOFT )   May take these medicines IF NEEDED: fluticasone  (FLONASE )  traMADol (ULTRAM)   One week prior to surgery, STOP taking any Aspirin (unless otherwise instructed by your surgeon) Aleve , Naproxen , Ibuprofen, Motrin, Advil, Goody's, BC's, all herbal medications, fish oil, and non-prescription vitamins.                     Do NOT Smoke (Tobacco/Vaping) for 24 hours prior to your procedure.  If you use a CPAP at night, you may bring your mask/headgear for your overnight stay.   You will be asked to remove any contacts, glasses, piercing's, hearing aid's, dentures/partials prior to surgery. Please bring cases for these items if needed.    Patients discharged the day of surgery will not be allowed to drive home, and someone needs to stay with them for 24 hours.  SURGICAL WAITING ROOM VISITATION Patients may have no more than 2 support people in the waiting area - these visitors may rotate.   Pre-op nurse will coordinate an appropriate time for 1 ADULT support person, who may not rotate, to accompany patient in pre-op.  Children under the age of 74 must have an adult with them who is not the patient and must remain in the main waiting area with an adult.  If the  patient needs to stay at the hospital during part of their recovery, the visitor guidelines for inpatient rooms apply.  Please refer to the [facility] website for the visitor guidelines for any additional information.   If you received a COVID test during your pre-op visit  it is requested that you wear a mask when out in public, stay away from anyone that may not be feeling well and notify your surgeon if you develop symptoms. If you have been in contact with anyone that has tested positive in the last 10 days please notify you surgeon.      Pre-operative 5 CHG Bathing Instructions   You can play a key role in reducing the risk of infection after surgery. Your skin needs to be as free of germs as possible. You can reduce the number of germs on your skin by washing with CHG (chlorhexidine gluconate) soap before surgery. CHG is an antiseptic soap that kills germs and continues to kill germs even after washing.   DO NOT use if you have an allergy to chlorhexidine/CHG or antibacterial soaps. If your skin becomes reddened or irritated, stop using the CHG and notify one of our RNs at [phone].   Please shower with the CHG soap starting 4 days before surgery using the following schedule:     Please keep in mind the following:  DO NOT shave, including legs and underarms, starting the day of your first shower.   You may shave  your face at any point before/day of surgery.  Place clean sheets on your bed the day you start using CHG soap. Use a clean washcloth (not used since being washed) for each shower. DO NOT sleep with pets once you start using the CHG.   CHG Shower Instructions:  Wash your face and private area with normal soap. If you choose to wash your hair, wash first with your normal shampoo.  After you use shampoo/soap, rinse your hair and body thoroughly to remove shampoo/soap residue.  Turn the water OFF and apply about 3 tablespoons (45 ml) of CHG soap to a CLEAN washcloth.   Apply CHG soap ONLY FROM YOUR NECK DOWN TO YOUR TOES (washing for 3-5 minutes)  DO NOT use CHG soap on face, private areas, open wounds, or sores.  Pay special attention to the area where your surgery is being performed.  If you are having back surgery, having someone wash your back for you may be helpful. Wait 2 minutes after CHG soap is applied, then you may rinse off the CHG soap.  Pat dry with a clean towel  Put on clean clothes/pajamas   If you choose to wear lotion, please use ONLY the CHG-compatible lotions that are listed below.  Additional instructions for the day of surgery: DO NOT APPLY any lotions, deodorants, cologne, or perfumes.   Do not bring valuables to the hospital. [facility] is not responsible for any belongings/valuables. Do not wear nail polish, gel polish, artificial nails, or any other type of covering on natural nails (fingers and toes) Do not wear jewelry or makeup Put on clean/comfortable clothes.  Please brush your teeth.  Ask your nurse before applying any prescription medications to the skin.     CHG Compatible Lotions   Aveeno Moisturizing lotion  Cetaphil Moisturizing Cream  Cetaphil Moisturizing Lotion  Clairol Herbal Essence Moisturizing Lotion, Dry Skin  Clairol Herbal Essence Moisturizing Lotion, Extra Dry Skin  Clairol Herbal Essence Moisturizing Lotion, Normal Skin  Curel Age Defying Therapeutic Moisturizing Lotion with Alpha Hydroxy  Curel Extreme Care Body Lotion  Curel Soothing Hands Moisturizing Hand Lotion  Curel Therapeutic Moisturizing Cream, Fragrance-Free  Curel Therapeutic Moisturizing Lotion, Fragrance-Free  Curel Therapeutic Moisturizing Lotion, Original Formula  Eucerin Daily Replenishing Lotion  Eucerin Dry Skin Therapy Plus Alpha Hydroxy Crme  Eucerin Dry Skin Therapy Plus Alpha Hydroxy Lotion  Eucerin Original Crme  Eucerin Original Lotion  Eucerin Plus Crme Eucerin Plus Lotion  Eucerin TriLipid Replenishing  Lotion  Keri Anti-Bacterial Hand Lotion  Keri Deep Conditioning Original Lotion Dry Skin Formula Softly Scented  Keri Deep Conditioning Original Lotion, Fragrance Free Sensitive Skin Formula  Keri Lotion Fast Absorbing Fragrance Free Sensitive Skin Formula  Keri Lotion Fast Absorbing Softly Scented Dry Skin Formula  Keri Original Lotion  Keri Skin Renewal Lotion Keri Silky Smooth Lotion  Keri Silky Smooth Sensitive Skin Lotion  Nivea Body Creamy Conditioning Oil  Nivea Body Extra Enriched Lotion  Nivea Body Original Lotion  Nivea Body Sheer Moisturizing Lotion Nivea Crme  Nivea Skin Firming Lotion  NutraDerm 30 Skin Lotion  NutraDerm Skin Lotion  NutraDerm Therapeutic Skin Cream  NutraDerm Therapeutic Skin Lotion  ProShield Protective Hand Cream  Provon moisturizing lotion  Please read over the following fact sheets that you were given.

## 2024-03-23 NOTE — Progress Notes (Signed)
 PCP - Dr Lyndel Sanes Cardiologist - none  Chest x-ray - n/a EKG - 03/24/24 Stress Test - n/a ECHO - n/a Cardiac Cath - n/a  ICD Pacemaker/Loop - n/a  Sleep Study -  n/a  Diabetes - n/a  Aspirin & Blood Thinner Instructions:  n/a  Pharmacy tech Jacqlyn Matas was not available to review patient meds during PAT appt.  Medications were verified by PAT RN at PAT visit.  Patient also given Pharmacy Call Center phone number for verification.  NPO   Anesthesia review: no  STOP now taking any Aspirin (unless otherwise instructed by your surgeon), Aleve , Naproxen , Ibuprofen, Motrin, Advil, Goody's, BC's, all herbal medications, fish oil, and all vitamins.   Coronavirus Screening Do you have any of the following symptoms:  Cough Yes, occasional Fever (>100.49F)  yes/no: No Runny nose No Sore throat yes/no: No Difficulty breathing/shortness of breath  yes/no: No  Have you traveled in the last 14 days and where? yes/no: No  Patient verbalized understanding of instructions that were given to them at the PAT appointment. Patient was also instructed that they will need to review over the PAT instructions again at home before surgery.

## 2024-03-24 ENCOUNTER — Encounter (HOSPITAL_COMMUNITY): Payer: Self-pay

## 2024-03-24 ENCOUNTER — Encounter (HOSPITAL_COMMUNITY)
Admission: RE | Admit: 2024-03-24 | Discharge: 2024-03-24 | Disposition: A | Discharge status: Home / Self Care | Source: Ambulatory Visit | Attending: Neurosurgery | Admitting: Neurosurgery

## 2024-03-24 ENCOUNTER — Other Ambulatory Visit: Payer: Self-pay

## 2024-03-24 VITALS — BP 123/60 | HR 70 | Temp 97.8°F | Resp 18 | Ht 61.0 in | Wt 147.7 lb

## 2024-03-24 DIAGNOSIS — Z01818 Encounter for other preprocedural examination: Secondary | ICD-10-CM | POA: Diagnosis not present

## 2024-03-24 HISTORY — DX: Dyspnea, unspecified: R06.00

## 2024-03-24 HISTORY — DX: Gastro-esophageal reflux disease without esophagitis: K21.9

## 2024-03-24 HISTORY — DX: Dysphagia, unspecified: R13.10

## 2024-03-24 LAB — CBC
HCT: 38.1 % (ref 36.0–46.0)
Hemoglobin: 12.5 g/dL (ref 12.0–15.0)
MCH: 30 pg (ref 26.0–34.0)
MCHC: 32.8 g/dL (ref 30.0–36.0)
MCV: 91.4 fL (ref 80.0–100.0)
Platelets: 206 10*3/uL (ref 150–400)
RBC: 4.17 MIL/uL (ref 3.87–5.11)
RDW: 13.6 % (ref 11.5–15.5)
WBC: 7.7 10*3/uL (ref 4.0–10.5)
nRBC: 0 % (ref 0.0–0.2)

## 2024-03-24 LAB — TYPE AND SCREEN
ABO/RH(D): A POS
Antibody Screen: NEGATIVE

## 2024-03-24 LAB — SURGICAL PCR SCREEN
MRSA, PCR: NEGATIVE
Staphylococcus aureus: POSITIVE — AB

## 2024-03-28 ENCOUNTER — Encounter: Payer: Self-pay | Admitting: Family Medicine

## 2024-03-30 NOTE — H&P (Signed)
 Chief Complaint   Back and leg pain  History of Present Illness  Donna Vargas is a 78 year old woman seen in the office for the above. Briefly, the patient comes in for evaluation of primarily buttock and lower extremity pain and paresthesias.  She reports symptoms whenever she is standing or walking for more than a few minutes at a time.  She does note heaviness in both her legs whenever she is standing, also notes some pain and discomfort at the same time.  She has minimal symptoms when sitting or lying down stationary.  She also notes improvement in symptoms when she leans over a shopping cart.  She does not report any changes in bladder function.  Of note, the patient has been treated conservatively by Dr. Rexanne Catalina.  The patient did see a physical therapist initially, unfortunately this did not provide any lasting improvement in her symptoms.  In addition, she has been on over-the-counter anti-inflammatories, Tylenol  works best.  She has also been trialed on gabapentin  and tramadol.  More recently, she has undergone a series of epidural steroid injections which unfortunately have also not provided any lasting improvement in symptoms.  To review, the patient does report a history of hypertension and dyslipidemia.  She is hypothyroid.  No previous heart attack or stroke.  No known lung, liver, kidney disease.  She is not on any blood thinners or antiplatelet agents.  No cancer history.  She is a nonsmoker.  Past Medical History   Past Medical History:  Diagnosis Date   Abnormal drug screen 05/2016   inapprop negative klonopin  rpt 3 mo (05/2016)   Arthritis    fingers/hands, back   COVID-19 virus infection 10/2019   Depression with anxiety    Difficulty swallowing    Modified Barium Swallow Study Completed on 03/17/24   Dyspnea    with exertion   GERD (gastroesophageal reflux disease)    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; resolved per pt on 03/24/24   History of asthma 1990s   with  bronchitis   History of diverticulitis    IBS   Hypertension    Hypothyroidism 2015   ?by prior PCP   Osteopenia 10/19/2019   DEXA 10/2019 T -1.2 hip (osteopenia)   Perennial allergic rhinitis    Hx of allergy shots (sharma)   Prediabetes 06/2014   no meds   Right thyroid  nodule 09/11/2014   S/p biopsy showing benign colloid nodule (08/2014)    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 CA125 level elevated - but no cancer   WISDOM TOOTH EXTRACTION      Social History   Social History   Tobacco Use   Smoking status: Never   Smokeless tobacco: Never  Vaping Use   Vaping status: Never Used  Substance Use Topics   Alcohol use: No    Alcohol/week: 0.0 standard drinks of alcohol   Drug use: No  Medications   Prior to Admission medications   Medication Sig Start Date End Date Taking? Authorizing Provider  acetaminophen  (TYLENOL ) 500 MG tablet Take 1 tablet (500 mg total) by mouth at bedtime. Patient taking differently: Take 500 mg by mouth daily as needed. 11/29/23   Claire Crick, MD  ALLERGY RELIEF CETIRIZINE  10 MG tablet TAKE 1 TABLET BY MOUTH EVERY DAY AT BEDTIME 12/22/23   Claire Crick, MD  amLODipine  (NORVASC ) 10 MG tablet Take 1 tablet (10 mg total) by mouth daily. 11/29/23   Claire Crick, MD  atorvastatin  (LIPITOR) 20 MG tablet Take 1 tablet (20 mg total) by mouth daily. 11/29/23   Claire Crick, MD  Cholecalciferol (VITAMIN D3) 25 MCG  (1000 UT) CAPS Take 2 capsules (2,000 Units total) by mouth daily. 02/17/21   Claire Crick, MD  clonazePAM  (KLONOPIN ) 0.5 MG tablet TAKE 1 TABLET BY MOUTH EVERY DAY AT BEDTIME 12/20/23   Claire Crick, MD  cyanocobalamin  (V-R VITAMIN B-12) 500 MCG tablet Take 1 tablet (500 mcg total) by mouth every Monday, Wednesday, and Friday. 11/25/22   Claire Crick, MD  fluticasone  (FLONASE ) 50 MCG/ACT nasal spray Place 2 sprays into both nostrils daily. Patient taking differently: Place 2 sprays into both nostrils daily. As needed 03/08/23   Tower, Manley Seeds, MD  levothyroxine  (SYNTHROID ) 75 MCG tablet Take 1 tablet (75 mcg total) by mouth every morning. 11/29/23   Claire Crick, MD  omeprazole  (PRILOSEC) 40 MG capsule Take 1 capsule (40 mg total) by mouth every morning. 11/29/23   Claire Crick, MD  oxybutynin  (DITROPAN -XL) 5 MG 24 hr tablet Take 1 tablet (5 mg total) by mouth at bedtime. 11/29/23   Claire Crick, MD  sertraline  (ZOLOFT ) 100 MG tablet Take 1 tablet (100 mg total) by mouth every morning. 11/29/23   Claire Crick, MD  traMADol (ULTRAM) 50 MG tablet Take 1 tablet 3 times a day by oral route as needed for pain for 10 days. 04/27/23   [provider]    Allergies   Allergies  Allergen Reactions   Hctz [Hydrochlorothiazide]     Blood came to the surface of her legs   Sulfa Antibiotics Rash    Review of Systems  ROS  Neurologic Exam  Awake, alert, oriented Memory and concentration grossly intact Speech fluent, appropriate CN grossly intact Motor exam: Upper Extremities Deltoid Bicep Tricep Grip  Right 5/5 5/5 5/5 5/5  Left 5/5 5/5 5/5 5/5   Lower Extremities IP Quad PF DF EHL  Right 5/5 5/5 5/5 5/5 5/5  Left 5/5 5/5 5/5 5/5 5/5   Sensation grossly intact to LT  Imaging  MRI lumbar spine dated 07/02/2023 was personally reviewed.  This does demonstrate evidence of dex stroke convex scoliosis.  There is significant facet arthropathy most notable at  L3-4 L4-5 and L5-S1.  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  There is primarily right-sided neural foraminal stenosis at L5-S1.  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  Unfortunately she has not had lasting improvement with multiple conservative treatment including physical therapy, medical therapy, and injection therapy.  Plan  - Will proceed with left-sided lateral trans psoas approach for interbody fusion at L3-4 and L4-5 with posterior percutaneous pedicle screw stabilization L3-L5.  I have reviewed the indications for the procedure as well as the details of the procedure and the expected postoperative course and  recovery at length with the patient in the office. We have also reviewed in detail the risks, benefits, and alternatives to the procedure. All questions were answered and Cniya Figgers provided informed consent to proceed.  Augusto Blonder, MD [facility]

## 2024-03-31 ENCOUNTER — Inpatient Hospital Stay (HOSPITAL_COMMUNITY)

## 2024-03-31 ENCOUNTER — Inpatient Hospital Stay (HOSPITAL_COMMUNITY): Payer: Self-pay | Admitting: Anesthesiology

## 2024-03-31 ENCOUNTER — Inpatient Hospital Stay (HOSPITAL_COMMUNITY)
Admission: RE | Admit: 2024-03-31 | Discharge: 2024-04-03 | DRG: 448 | Disposition: A | Discharge status: Home Health | Attending: Neurosurgery | Admitting: Neurosurgery

## 2024-03-31 ENCOUNTER — Encounter (HOSPITAL_COMMUNITY): Payer: Self-pay | Admitting: Neurosurgery

## 2024-03-31 ENCOUNTER — Other Ambulatory Visit: Payer: Self-pay

## 2024-03-31 ENCOUNTER — Encounter (HOSPITAL_COMMUNITY)
Admission: RE | Disposition: A | Discharge status: Home Health | Payer: Self-pay | Source: Home / Self Care | Attending: Neurosurgery

## 2024-03-31 DIAGNOSIS — R7303 Prediabetes: Secondary | ICD-10-CM | POA: Diagnosis present

## 2024-03-31 DIAGNOSIS — Z888 Allergy status to other drugs, medicaments and biological substances status: Secondary | ICD-10-CM

## 2024-03-31 DIAGNOSIS — E785 Hyperlipidemia, unspecified: Secondary | ICD-10-CM | POA: Diagnosis present

## 2024-03-31 DIAGNOSIS — M419 Scoliosis, unspecified: Secondary | ICD-10-CM | POA: Diagnosis present

## 2024-03-31 DIAGNOSIS — M85859 Other specified disorders of bone density and structure, unspecified thigh: Secondary | ICD-10-CM | POA: Diagnosis present

## 2024-03-31 DIAGNOSIS — J45909 Unspecified asthma, uncomplicated: Secondary | ICD-10-CM | POA: Diagnosis present

## 2024-03-31 DIAGNOSIS — F419 Anxiety disorder, unspecified: Secondary | ICD-10-CM | POA: Diagnosis present

## 2024-03-31 DIAGNOSIS — Z8616 Personal history of COVID-19: Secondary | ICD-10-CM | POA: Diagnosis not present

## 2024-03-31 DIAGNOSIS — M4306 Spondylolysis, lumbar region: Secondary | ICD-10-CM | POA: Diagnosis present

## 2024-03-31 DIAGNOSIS — E039 Hypothyroidism, unspecified: Secondary | ICD-10-CM | POA: Diagnosis present

## 2024-03-31 DIAGNOSIS — M48061 Spinal stenosis, lumbar region without neurogenic claudication: Secondary | ICD-10-CM | POA: Diagnosis present

## 2024-03-31 DIAGNOSIS — M47816 Spondylosis without myelopathy or radiculopathy, lumbar region: Secondary | ICD-10-CM

## 2024-03-31 DIAGNOSIS — K589 Irritable bowel syndrome without diarrhea: Secondary | ICD-10-CM | POA: Diagnosis present

## 2024-03-31 DIAGNOSIS — Z79899 Other long term (current) drug therapy: Secondary | ICD-10-CM | POA: Diagnosis not present

## 2024-03-31 DIAGNOSIS — Z90710 Acquired absence of both cervix and uterus: Secondary | ICD-10-CM | POA: Diagnosis not present

## 2024-03-31 DIAGNOSIS — F418 Other specified anxiety disorders: Secondary | ICD-10-CM | POA: Diagnosis not present

## 2024-03-31 DIAGNOSIS — Z882 Allergy status to sulfonamides status: Secondary | ICD-10-CM | POA: Diagnosis not present

## 2024-03-31 DIAGNOSIS — I10 Essential (primary) hypertension: Secondary | ICD-10-CM | POA: Diagnosis present

## 2024-03-31 DIAGNOSIS — K219 Gastro-esophageal reflux disease without esophagitis: Secondary | ICD-10-CM | POA: Diagnosis present

## 2024-03-31 DIAGNOSIS — M19042 Primary osteoarthritis, left hand: Secondary | ICD-10-CM | POA: Diagnosis present

## 2024-03-31 DIAGNOSIS — Z85828 Personal history of other malignant neoplasm of skin: Secondary | ICD-10-CM

## 2024-03-31 DIAGNOSIS — M549 Dorsalgia, unspecified: Secondary | ICD-10-CM | POA: Diagnosis not present

## 2024-03-31 DIAGNOSIS — M5416 Radiculopathy, lumbar region: Secondary | ICD-10-CM | POA: Diagnosis not present

## 2024-03-31 DIAGNOSIS — Z981 Arthrodesis status: Secondary | ICD-10-CM | POA: Diagnosis not present

## 2024-03-31 DIAGNOSIS — R1310 Dysphagia, unspecified: Secondary | ICD-10-CM | POA: Diagnosis present

## 2024-03-31 DIAGNOSIS — M19041 Primary osteoarthritis, right hand: Secondary | ICD-10-CM | POA: Diagnosis present

## 2024-03-31 DIAGNOSIS — F32A Depression, unspecified: Secondary | ICD-10-CM | POA: Diagnosis present

## 2024-03-31 DIAGNOSIS — Z79890 Hormone replacement therapy (postmenopausal): Secondary | ICD-10-CM | POA: Diagnosis not present

## 2024-03-31 DIAGNOSIS — M4807 Spinal stenosis, lumbosacral region: Secondary | ICD-10-CM | POA: Diagnosis present

## 2024-03-31 HISTORY — PX: LUMBAR PERCUTANEOUS PEDICLE SCREW 2 LEVEL: SHX5561

## 2024-03-31 HISTORY — PX: ANTERIOR LAT LUMBAR FUSION: SHX1168

## 2024-03-31 LAB — ABO/RH: ABO/RH(D): A POS

## 2024-03-31 SURGERY — ANTERIOR LATERAL LUMBAR FUSION 2 LEVELS
Anesthesia: General | Laterality: Left

## 2024-03-31 MED ORDER — ONDANSETRON HCL 4 MG PO TABS: 4.0000 mg | ORAL_TABLET | Freq: Four times a day (QID) | ORAL | Status: DC | PRN

## 2024-03-31 MED ORDER — HYDROMORPHONE HCL 1 MG/ML IJ SOLN
INTRAMUSCULAR | Status: AC
  Filled 2024-03-31: qty 1

## 2024-03-31 MED ORDER — MUPIROCIN 2 % EX OINT: 1.0000 | TOPICAL_OINTMENT | Freq: Two times a day (BID) | CUTANEOUS | 0 refills | Status: AC

## 2024-03-31 MED ORDER — OXYCODONE HCL 5 MG PO TABS
5.0000 mg | ORAL_TABLET | ORAL | Status: DC | PRN
  Administered 2024-04-03: 5 mg via ORAL

## 2024-03-31 MED ORDER — LEVOTHYROXINE SODIUM 75 MCG PO TABS
75.0000 ug | ORAL_TABLET | Freq: Every morning | ORAL | Status: DC
  Administered 2024-04-01 – 2024-04-03 (×3): 75 ug via ORAL
  Filled 2024-03-31 (×3): qty 1

## 2024-03-31 MED ORDER — VITAMIN D 25 MCG (1000 UNIT) PO TABS
2000.0000 [IU] | ORAL_TABLET | Freq: Every day | ORAL | Status: DC
  Administered 2024-04-01 – 2024-04-03 (×3): 2000 [IU] via ORAL
  Filled 2024-03-31 (×3): qty 2

## 2024-03-31 MED ORDER — THROMBIN 5000 UNITS EX SOLR
CUTANEOUS | Status: DC | PRN
  Administered 2024-03-31 (×3): 5000 [IU] via TOPICAL

## 2024-03-31 MED ORDER — ACETAMINOPHEN 10 MG/ML IV SOLN
INTRAVENOUS | Status: AC
  Filled 2024-03-31: qty 100

## 2024-03-31 MED ORDER — ACETAMINOPHEN 10 MG/ML IV SOLN
INTRAVENOUS | Status: DC | PRN
  Administered 2024-03-31: 1000 mg via INTRAVENOUS

## 2024-03-31 MED ORDER — METHYLPREDNISOLONE ACETATE 80 MG/ML IJ SUSP
INTRAMUSCULAR | Status: AC
  Filled 2024-03-31: qty 1

## 2024-03-31 MED ORDER — VITAMIN B-12 1000 MCG PO TABS
500.0000 ug | ORAL_TABLET | ORAL | Status: DC
  Administered 2024-03-31 – 2024-04-03 (×2): 500 ug via ORAL
  Filled 2024-03-31 (×3): qty 1

## 2024-03-31 MED ORDER — MIDAZOLAM HCL 2 MG/2ML IJ SOLN: 0.5000 mg | Freq: Once | INTRAMUSCULAR | Status: DC | PRN

## 2024-03-31 MED ORDER — FENTANYL CITRATE (PF) 250 MCG/5ML IJ SOLN
INTRAMUSCULAR | Status: AC
  Filled 2024-03-31: qty 5

## 2024-03-31 MED ORDER — ROCURONIUM BROMIDE 10 MG/ML (PF) SYRINGE
PREFILLED_SYRINGE | INTRAVENOUS | Status: DC | PRN
Start: 2024-03-31 — End: 2024-03-31
  Administered 2024-03-31: 50 mg via INTRAVENOUS

## 2024-03-31 MED ORDER — PROPOFOL 10 MG/ML IV BOLUS
INTRAVENOUS | Status: AC
  Filled 2024-03-31: qty 20

## 2024-03-31 MED ORDER — SODIUM CHLORIDE 0.9% FLUSH
3.0000 mL | Freq: Two times a day (BID) | INTRAVENOUS | Status: DC
  Administered 2024-03-31 – 2024-04-03 (×6): 3 mL via INTRAVENOUS

## 2024-03-31 MED ORDER — AMLODIPINE BESYLATE 10 MG PO TABS
10.0000 mg | ORAL_TABLET | Freq: Every day | ORAL | Status: DC
  Administered 2024-04-01 – 2024-04-03 (×3): 10 mg via ORAL
  Filled 2024-03-31 (×3): qty 1

## 2024-03-31 MED ORDER — PHENYLEPHRINE 80 MCG/ML (10ML) SYRINGE FOR IV PUSH (FOR BLOOD PRESSURE SUPPORT): PREFILLED_SYRINGE | INTRAVENOUS | Status: DC | PRN

## 2024-03-31 MED ORDER — CEFAZOLIN SODIUM-DEXTROSE 2-4 GM/100ML-% IV SOLN
2.0000 g | Freq: Three times a day (TID) | INTRAVENOUS | Status: AC
  Administered 2024-04-01 (×2): 2 g via INTRAVENOUS
  Filled 2024-03-31 (×2): qty 100

## 2024-03-31 MED ORDER — MENTHOL 3 MG MT LOZG: 1.0000 | LOZENGE | OROMUCOSAL | Status: DC | PRN

## 2024-03-31 MED ORDER — 0.9 % SODIUM CHLORIDE (POUR BTL) OPTIME
TOPICAL | Status: DC | PRN
  Administered 2024-03-31: 1000 mL

## 2024-03-31 MED ORDER — MORPHINE SULFATE (PF) 2 MG/ML IV SOLN
2.0000 mg | INTRAVENOUS | Status: DC | PRN
  Administered 2024-03-31 – 2024-04-01 (×4): 2 mg via INTRAVENOUS
  Filled 2024-03-31 (×5): qty 1

## 2024-03-31 MED ORDER — PHENOL 1.4 % MT LIQD: 1.0000 | OROMUCOSAL | Status: DC | PRN

## 2024-03-31 MED ORDER — CHLORHEXIDINE GLUCONATE 0.12 % MT SOLN
15.0000 mL | OROMUCOSAL | Status: AC
  Administered 2024-03-31: 15 mL via OROMUCOSAL
  Filled 2024-03-31: qty 15

## 2024-03-31 MED ORDER — MEPERIDINE HCL 25 MG/ML IJ SOLN: 6.2500 mg | INTRAMUSCULAR | Status: DC | PRN

## 2024-03-31 MED ORDER — PANTOPRAZOLE SODIUM 40 MG IV SOLR: 40.0000 mg | Freq: Every day | INTRAVENOUS | Status: DC

## 2024-03-31 MED ORDER — HYDROMORPHONE HCL 1 MG/ML IJ SOLN
INTRAMUSCULAR | Status: DC | PRN
  Administered 2024-03-31 (×2): .25 mg via INTRAVENOUS

## 2024-03-31 MED ORDER — PROPOFOL 500 MG/50ML IV EMUL
INTRAVENOUS | Status: DC | PRN
  Administered 2024-03-31: 100 ug/kg/min via INTRAVENOUS

## 2024-03-31 MED ORDER — OXYCODONE HCL 5 MG PO TABS: 5.0000 mg | ORAL_TABLET | Freq: Once | ORAL | Status: DC | PRN

## 2024-03-31 MED ORDER — THROMBIN 5000 UNITS EX KIT
PACK | CUTANEOUS | Status: AC
  Filled 2024-03-31: qty 3

## 2024-03-31 MED ORDER — OXYCODONE HCL 5 MG PO TABS
10.0000 mg | ORAL_TABLET | ORAL | Status: DC | PRN
  Administered 2024-03-31 – 2024-04-02 (×7): 10 mg via ORAL
  Filled 2024-03-31 (×8): qty 2

## 2024-03-31 MED ORDER — OXYBUTYNIN CHLORIDE ER 5 MG PO TB24
5.0000 mg | ORAL_TABLET | Freq: Every day | ORAL | Status: DC
  Administered 2024-03-31 – 2024-04-02 (×3): 5 mg via ORAL
  Filled 2024-03-31 (×4): qty 1

## 2024-03-31 MED ORDER — CEFAZOLIN SODIUM-DEXTROSE 2-4 GM/100ML-% IV SOLN
2.0000 g | INTRAVENOUS | Status: DC
  Filled 2024-03-31: qty 100

## 2024-03-31 MED ORDER — CHLORHEXIDINE GLUCONATE CLOTH 2 % EX PADS: 6.0000 | MEDICATED_PAD | Freq: Once | CUTANEOUS | Status: DC

## 2024-03-31 MED ORDER — LACTATED RINGERS IV SOLN: INTRAVENOUS | Status: DC

## 2024-03-31 MED ORDER — CEFAZOLIN SODIUM-DEXTROSE 2-3 GM-%(50ML) IV SOLR
INTRAVENOUS | Status: DC | PRN
  Administered 2024-03-31: 2 g via INTRAVENOUS

## 2024-03-31 MED ORDER — SODIUM CHLORIDE 0.9% FLUSH: 3.0000 mL | INTRAVENOUS | Status: DC | PRN

## 2024-03-31 MED ORDER — HYDROMORPHONE HCL 1 MG/ML IJ SOLN
INTRAMUSCULAR | Status: AC
  Filled 2024-03-31: qty 0.5

## 2024-03-31 MED ORDER — DEXAMETHASONE SODIUM PHOSPHATE 10 MG/ML IJ SOLN
INTRAMUSCULAR | Status: DC | PRN
  Administered 2024-03-31: 10 mg via INTRAVENOUS

## 2024-03-31 MED ORDER — PROPOFOL 10 MG/ML IV BOLUS
INTRAVENOUS | Status: DC | PRN
  Administered 2024-03-31: 120 mg via INTRAVENOUS

## 2024-03-31 MED ORDER — THROMBIN (RECOMBINANT) 5000 UNITS EX SOLR
CUTANEOUS | Status: DC | PRN
  Administered 2024-03-31: 1 via TOPICAL

## 2024-03-31 MED ORDER — CEFAZOLIN SODIUM-DEXTROSE 2-4 GM/100ML-% IV SOLN
2.0000 g | Freq: Once | INTRAVENOUS | Status: AC
  Administered 2024-03-31: 2 g via INTRAVENOUS

## 2024-03-31 MED ORDER — BISACODYL 10 MG RE SUPP: 10.0000 mg | Freq: Every day | RECTAL | Status: DC | PRN

## 2024-03-31 MED ORDER — CHLORHEXIDINE GLUCONATE 4 % EX SOLN: 1.0000 | CUTANEOUS | 1 refills | Status: DC

## 2024-03-31 MED ORDER — BUPIVACAINE HCL (PF) 0.5 % IJ SOLN
INTRAMUSCULAR | Status: AC
  Filled 2024-03-31: qty 30

## 2024-03-31 MED ORDER — OXYCODONE HCL 5 MG/5ML PO SOLN: 5.0000 mg | Freq: Once | ORAL | Status: DC | PRN

## 2024-03-31 MED ORDER — BUPIVACAINE HCL (PF) 0.5 % IJ SOLN
INTRAMUSCULAR | Status: DC | PRN
  Administered 2024-03-31: 18 mL

## 2024-03-31 MED ORDER — BACITRACIN ZINC 500 UNIT/GM EX OINT
TOPICAL_OINTMENT | CUTANEOUS | Status: AC
  Filled 2024-03-31: qty 28.35

## 2024-03-31 MED ORDER — FENTANYL CITRATE (PF) 250 MCG/5ML IJ SOLN
INTRAMUSCULAR | Status: DC | PRN
  Administered 2024-03-31 (×2): 50 ug via INTRAVENOUS
  Administered 2024-03-31: 150 ug via INTRAVENOUS

## 2024-03-31 MED ORDER — SERTRALINE HCL 100 MG PO TABS
100.0000 mg | ORAL_TABLET | Freq: Every morning | ORAL | Status: DC
Start: 2024-04-01 — End: 2024-04-03
  Administered 2024-04-01 – 2024-04-03 (×3): 100 mg via ORAL
  Filled 2024-03-31 (×3): qty 1

## 2024-03-31 MED ORDER — HYDROMORPHONE HCL 1 MG/ML IJ SOLN
0.2500 mg | INTRAMUSCULAR | Status: DC | PRN
  Administered 2024-03-31 (×2): 0.5 mg via INTRAVENOUS

## 2024-03-31 MED ORDER — GELATIN ABSORBABLE MT POWD
OROMUCOSAL | Status: DC | PRN
  Administered 2024-03-31: 1 via TOPICAL

## 2024-03-31 MED ORDER — ACETAMINOPHEN 325 MG PO TABS: 650.0000 mg | ORAL_TABLET | ORAL | Status: DC | PRN

## 2024-03-31 MED ORDER — SENNA 8.6 MG PO TABS
1.0000 | ORAL_TABLET | Freq: Two times a day (BID) | ORAL | Status: DC
  Administered 2024-03-31 – 2024-04-03 (×6): 8.6 mg via ORAL
  Filled 2024-03-31 (×6): qty 1

## 2024-03-31 MED ORDER — LIDOCAINE 2% (20 MG/ML) 5 ML SYRINGE
INTRAMUSCULAR | Status: DC | PRN
  Administered 2024-03-31: 20 mg via INTRAVENOUS

## 2024-03-31 MED ORDER — DOCUSATE SODIUM 100 MG PO CAPS
100.0000 mg | ORAL_CAPSULE | Freq: Two times a day (BID) | ORAL | Status: DC
  Administered 2024-03-31 – 2024-04-03 (×6): 100 mg via ORAL
  Filled 2024-03-31 (×6): qty 1

## 2024-03-31 MED ORDER — LIDOCAINE-EPINEPHRINE 1 %-1:100000 IJ SOLN
INTRAMUSCULAR | Status: AC
  Filled 2024-03-31: qty 1

## 2024-03-31 MED ORDER — EPHEDRINE SULFATE (PRESSORS) 50 MG/ML IJ SOLN
INTRAMUSCULAR | Status: DC | PRN
  Administered 2024-03-31: 5 mg via INTRAVENOUS

## 2024-03-31 MED ORDER — SODIUM CHLORIDE 0.9 % IV SOLN
250.0000 mL | INTRAVENOUS | Status: AC
  Administered 2024-03-31: 250 mL via INTRAVENOUS

## 2024-03-31 MED ORDER — CEFAZOLIN SODIUM-DEXTROSE 2-4 GM/100ML-% IV SOLN
INTRAVENOUS | Status: AC
  Filled 2024-03-31: qty 100

## 2024-03-31 MED ORDER — SODIUM CHLORIDE 0.9 % IV SOLN: INTRAVENOUS | Status: DC | PRN

## 2024-03-31 MED ORDER — METHOCARBAMOL 1000 MG/10ML IJ SOLN: 500.0000 mg | Freq: Four times a day (QID) | INTRAMUSCULAR | Status: DC | PRN

## 2024-03-31 MED ORDER — SUGAMMADEX SODIUM 200 MG/2ML IV SOLN
INTRAVENOUS | Status: DC | PRN
  Administered 2024-03-31: 150 mg via INTRAVENOUS

## 2024-03-31 MED ORDER — SODIUM CHLORIDE 0.9 % IV SOLN
0.1500 ug/kg/min | INTRAVENOUS | Status: DC
  Administered 2024-03-31: .15 ug/kg/min via INTRAVENOUS
  Filled 2024-03-31 (×2): qty 2000

## 2024-03-31 MED ORDER — PANTOPRAZOLE SODIUM 40 MG PO TBEC
40.0000 mg | DELAYED_RELEASE_TABLET | Freq: Every day | ORAL | Status: DC
  Administered 2024-04-01 – 2024-04-03 (×3): 40 mg via ORAL
  Filled 2024-03-31 (×3): qty 1

## 2024-03-31 MED ORDER — ONDANSETRON HCL 4 MG/2ML IJ SOLN
INTRAMUSCULAR | Status: DC | PRN
Start: 2024-03-31 — End: 2024-03-31
  Administered 2024-03-31: 4 mg via INTRAVENOUS

## 2024-03-31 MED ORDER — CLONAZEPAM 0.5 MG PO TABS
0.5000 mg | ORAL_TABLET | Freq: Every day | ORAL | Status: DC
  Administered 2024-04-01 – 2024-04-02 (×2): 0.5 mg via ORAL
  Filled 2024-03-31 (×3): qty 1

## 2024-03-31 MED ORDER — LIDOCAINE-EPINEPHRINE 1 %-1:100000 IJ SOLN
INTRAMUSCULAR | Status: DC | PRN
  Administered 2024-03-31: 5 mL

## 2024-03-31 MED ORDER — ONDANSETRON HCL 4 MG/2ML IJ SOLN: 4.0000 mg | Freq: Four times a day (QID) | INTRAMUSCULAR | Status: DC | PRN

## 2024-03-31 MED ORDER — ATORVASTATIN CALCIUM 10 MG PO TABS
20.0000 mg | ORAL_TABLET | Freq: Every day | ORAL | Status: DC
  Administered 2024-04-01 – 2024-04-03 (×3): 20 mg via ORAL
  Filled 2024-03-31 (×3): qty 2

## 2024-03-31 MED ORDER — PHENYLEPHRINE HCL-NACL 20-0.9 MG/250ML-% IV SOLN
INTRAVENOUS | Status: DC | PRN
  Administered 2024-03-31: 45 ug/min via INTRAVENOUS

## 2024-03-31 MED ORDER — METHOCARBAMOL 500 MG PO TABS
500.0000 mg | ORAL_TABLET | Freq: Four times a day (QID) | ORAL | Status: DC | PRN
  Administered 2024-03-31 – 2024-04-01 (×4): 500 mg via ORAL
  Filled 2024-03-31 (×5): qty 1

## 2024-03-31 MED ORDER — ACETAMINOPHEN 650 MG RE SUPP: 650.0000 mg | RECTAL | Status: DC | PRN

## 2024-03-31 SURGICAL SUPPLY — 87 items
ALLOGRAFT BONE FIBER KORE 5 (Bone Implant) IMPLANT
BAG COUNTER SPONGE SURGICOUNT (BAG) ×6 IMPLANT
BENZOIN TINCTURE PRP APPL 2/3 (GAUZE/BANDAGES/DRESSINGS) IMPLANT
BLADE CLIPPER SURG (BLADE) IMPLANT
BLADE EXTENDER LIF DISP N26 (INSTRUMENTS) IMPLANT
BLADE SURG 11 STRL SS (BLADE) ×3 IMPLANT
BUR 14 MATCH 3 (BUR) IMPLANT
BUR MATCHSTICK NEURO 3.0 LAGG (BURR) ×3 IMPLANT
BUR PRECISION FLUTE 5.0 (BURR) ×3 IMPLANT
CANISTER SUCT 3000ML PPV (MISCELLANEOUS) ×3 IMPLANT
CAP PUSHER PTP (ORTHOPEDIC DISPOSABLE SUPPLIES) IMPLANT
CLIP SPRING STIM LLIF SAFEOP (CLIP) IMPLANT
CNTNR URN SCR LID CUP LEK RST (MISCELLANEOUS) ×3 IMPLANT
COVER BACK TABLE 60X90IN (DRAPES) ×3 IMPLANT
COVERAGE SUPPORT O-ARM STEALTH (MISCELLANEOUS) ×2 IMPLANT
DERMABOND ADVANCED .7 DNX12 (GAUZE/BANDAGES/DRESSINGS) ×6 IMPLANT
DILATOR INSULATED LLIF 8-13-18 (NEUROSURGERY SUPPLIES) IMPLANT
DISSECTOR BLUNT TIP ENDO 5MM (MISCELLANEOUS) ×3 IMPLANT
DRAPE C-ARM 42X72 X-RAY (DRAPES) ×6 IMPLANT
DRAPE C-ARMOR (DRAPES) ×6 IMPLANT
DRAPE LAPAROTOMY 100X72X124 (DRAPES) ×6 IMPLANT
DRAPE SHEET LG 3/4 BI-LAMINATE (DRAPES) ×12 IMPLANT
DRAPE SURG 17X23 STRL (DRAPES) ×3 IMPLANT
DRAPE SURG ORHT 6 SPLT 77X108 (DRAPES) IMPLANT
DRSG OPSITE POSTOP 4X6 (GAUZE/BANDAGES/DRESSINGS) IMPLANT
DURAPREP 26ML APPLICATOR (WOUND CARE) ×6 IMPLANT
ELECTRODE KT SAFEOP SSEP/SURF (KITS) IMPLANT
ELECTRODE REM PT RTRN 9FT ADLT (ELECTROSURGICAL) ×6 IMPLANT
FEE COVERAGE SUPPORT O-ARM (MISCELLANEOUS) ×3 IMPLANT
GAUZE 4X4 16PLY ~~LOC~~+RFID DBL (SPONGE) IMPLANT
GAUZE SPONGE 4X4 12PLY STRL (GAUZE/BANDAGES/DRESSINGS) IMPLANT
GLOVE BIO SURGEON STRL SZ7 (GLOVE) IMPLANT
GLOVE BIOGEL PI IND STRL 7.0 (GLOVE) IMPLANT
GLOVE BIOGEL PI IND STRL 7.5 (GLOVE) ×9 IMPLANT
GLOVE ECLIPSE 7.0 STRL STRAW (GLOVE) ×6 IMPLANT
GLOVE EXAM NITRILE XL STR (GLOVE) IMPLANT
GOWN STRL REUS W/ TWL LRG LVL3 (GOWN DISPOSABLE) ×6 IMPLANT
GOWN STRL REUS W/ TWL XL LVL3 (GOWN DISPOSABLE) ×6 IMPLANT
GOWN STRL REUS W/TWL 2XL LVL3 (GOWN DISPOSABLE) IMPLANT
GUIDEWIRE LLIF TT 310 (WIRE) IMPLANT
HEMOSTAT POWDER KIT SURGIFOAM (HEMOSTASIS) ×3 IMPLANT
KIT BASIN OR (CUSTOM PROCEDURE TRAY) ×6 IMPLANT
KIT INFUSE SMALL (Orthopedic Implant) IMPLANT
KIT POSITION SURG JACKSON T1 (MISCELLANEOUS) IMPLANT
KIT TURNOVER KIT B (KITS) ×6 IMPLANT
KNIFE ANNULOTOMY SINGLE STL (ORTHOPEDIC DISPOSABLE SUPPLIES) IMPLANT
MARKER SPHERE PSV REFLC NDI (MISCELLANEOUS) ×15 IMPLANT
MODULE POSITIONING LIF 2.0 (INSTRUMENTS) IMPLANT
NDL HYPO 18GX1.5 BLUNT FILL (NEEDLE) IMPLANT
NDL HYPO 22X1.5 SAFETY MO (MISCELLANEOUS) ×3 IMPLANT
NDL HYPO 25X1 1.5 SAFETY (NEEDLE) ×3 IMPLANT
NDL SPNL 18GX3.5 QUINCKE PK (NEEDLE) ×3 IMPLANT
NEEDLE HYPO 18GX1.5 BLUNT FILL (NEEDLE) IMPLANT
NEEDLE HYPO 22X1.5 SAFETY MO (MISCELLANEOUS) ×2 IMPLANT
NEEDLE HYPO 25X1 1.5 SAFETY (NEEDLE) IMPLANT
NEEDLE SPNL 18GX3.5 QUINCKE PK (NEEDLE) IMPLANT
NS IRRIG 1000ML POUR BTL (IV SOLUTION) ×6 IMPLANT
PACK LAMINECTOMY NEURO (CUSTOM PROCEDURE TRAY) ×6 IMPLANT
PAD ARMBOARD POSITIONER FOAM (MISCELLANEOUS) ×9 IMPLANT
PATTIES SURGICAL .5 X.5 (GAUZE/BANDAGES/DRESSINGS) ×9 IMPLANT
PATTIES SURGICAL 1X1 (DISPOSABLE) ×9 IMPLANT
PIN BONE FIX 100 (PIN) IMPLANT
PROBE BALL TIP LLIF SAFEOP (NEUROSURGERY SUPPLIES) IMPLANT
ROD LORD MIS TI 5.5X65 (Rod) IMPLANT
SCREW CANN FENS 6.5X45 (Screw) IMPLANT
SET SCREW SPNE (Screw) IMPLANT
SHIM INTRADISCAL PTP NARROW (ORTHOPEDIC DISPOSABLE SUPPLIES) IMPLANT
SPACER LTX 6X18X45 15D (Spacer) IMPLANT
SPACER LTX 6X18X45 20D (Spacer) IMPLANT
SPIKE FLUID TRANSFER (MISCELLANEOUS) ×6 IMPLANT
SPONGE SURGIFOAM ABS GEL 100 (HEMOSTASIS) ×3 IMPLANT
SPONGE SURGIFOAM ABS GEL SZ50 (HEMOSTASIS) IMPLANT
SPONGE T-LAP 4X18 ~~LOC~~+RFID (SPONGE) IMPLANT
STAPLER VISISTAT (STAPLE) ×3 IMPLANT
STRIP CLOSURE SKIN 1/2X4 (GAUZE/BANDAGES/DRESSINGS) IMPLANT
SUT VIC AB 0 CT1 18XCR BRD8 (SUTURE) ×6 IMPLANT
SUT VIC AB 2-0 CT1 18 (SUTURE) ×3 IMPLANT
SUT VIC AB 3-0 SH 8-18 (SUTURE) ×6 IMPLANT
SUT VICRYL 3-0 RB1 18 ABS (SUTURE) ×3 IMPLANT
SYR 30ML SLIP (SYRINGE) ×9 IMPLANT
SYR 3ML LL SCALE MARK (SYRINGE) IMPLANT
SYS LTP ILLUMINATOR SIGMA ATEC (ORTHOPEDIC DISPOSABLE SUPPLIES) IMPLANT
TOWEL GREEN STERILE (TOWEL DISPOSABLE) ×6 IMPLANT
TOWEL GREEN STERILE FF (TOWEL DISPOSABLE) ×6 IMPLANT
TRAP SPECIMEN MUCUS 40CC (MISCELLANEOUS) ×3 IMPLANT
TRAY FOLEY MTR SLVR 16FR STAT (SET/KITS/TRAYS/PACK) ×6 IMPLANT
WATER STERILE IRR 1000ML POUR (IV SOLUTION) ×6 IMPLANT

## 2024-03-31 NOTE — Progress Notes (Signed)
 Orthopedic Tech Progress Note Patient Details:  Donna Vargas 1946-01-15 [id number]  Ortho Devices Type of Ortho Device: Lumbar corsett Ortho Device/Splint Location: Left at bedside/pt just out of surgery Ortho Device/Splint Interventions: Janeen Meckel 03/31/2024, 7:30 PM

## 2024-03-31 NOTE — Op Note (Signed)
 NEUROSURGERY OPERATIVE NOTE   PREOP DIAGNOSIS:  Lumbar spondylosis   POSTOP DIAGNOSIS: Same  PROCEDURE: Trans-psoas approach for complete discectomy, placement of anterior interbody cage L3-4, L4-5 - Alphatec expandable cages Interbody arthrodesis, L3-4, L4-5 Posterior segmental instrumentation, L3-L5 - Alphatec percutaneous pedicle screws Use of morcellized bone allograft, BMP Intraoperative electrophysiologic monitoring including SSEP  SURGEON: Dr. Augusto Blonder, MD  ASSISTANT: Easter Golden, PA-C  ANESTHESIA: General Endotracheal  EBL: 500cc  SPECIMENS: None  DRAINS: None  COMPLICATIONS: None immediate  CONDITION: Hemodynamically stable to PACU  HISTORY: Magdalene Petzoldt is a 78 y.o. female with a history of relatively chronic back and bilateral leg pain and paresthesias.  Imaging has revealed significant lumbar spondylosis with associated stenosis most significant at L3-4 and 4 5.  She has attempted multiple conservative treatments without significant improvement in her symptoms.  She therefore elected to proceed with surgical decompression and fusion.  The risks, benefits, and alternatives to surgery were all reviewed in detail with the patient and her family.  After all questions were answered informed consent was obtained and witnessed.  PROCEDURE IN DETAIL: The patient was brought to the operating room. After induction of general anesthesia, the patient was positioned on the operative table in the prone position. All pressure points were meticulously padded. Skin incision was then marked out and prepped and draped in the usual sterile fashion.  After timeout was conducted, a small stab incision was made over the right posterior superior iliac spine.  A Schanz pin was introduced and the navigation array was connected.  Intraoperative CT scan was then taken and noted to have excellent accuracy.  Utilizing the stereotactic system, I marked out surface projection  of standard pedicle screws from L3-L5 bilaterally.  Incisions were then marked out on each side and infiltrated with local anesthetic with epinephrine .  The incisions were then opened sharply and carried down through subcutaneous tissue.  The lumbodorsal fascia was identified and incised on both sides.  Lysing the stereotactic system, high-speed drill was used to create pilot holes for standard trajectory pedicle screws bilaterally at L3, L4, L5.  Pilot holes were then tapped to 5.5 mm and 6.5 x 45 mm screws were placed bilaterally at L3-L4 and L5.  At this point attention was turned to the lateral portion of the procedure.  Again utilizing the stereotactic system, the surface projection of the L3-4 and L4-5 interspaces were identified on the left flank.  An oblique incision was then marked out and infiltrated with local anesthetic with epinephrine .  Incision was then opened sharply and carried down through subcutaneous tissue.  The oblique musculature and transversalis fascia were identified.  Transversalis fascia was then opened and the retroperitoneal space was dissected bluntly.  Utilizing the stereotactic navigated dilators, trajectory to the L4-5 disc space was identified, utilizing monitoring in order to identify the location of the lumbar plexus.  I was able to docked the retractor on the L4-5 interspace with the plexus stimulating posteriorly at a threshold of approximately 9 amps.  Under direct vision, the region was stimulated directly, again without lumbar plexus nearby.  A shim was then placed posteriorly and the retractor was opened anteriorly to approximately 18 mm.  The disc base was then incised sharply and using a combination of curettes and rongeur's, the discectomy was completed.  Trials were then placed.  I then selected a 20 degree lordotic cage which was packed with a combination of BMP and morselized bone allograft.  This was then tapped into place  under fluoroscopic guidance.  The cage was  then expanded to restore disc height.  The retractor was then removed under direct vision and no active bleeding was identified.  Utilizing a similar procedure through the same opening in the transversalis fascia, approach to the L3-4 disc space was obtained utilizing the stereotactic dilators.  The dilators were stimulated and again, the lumbar plexus was identified posteriorly with a stimulation threshold of approximately 13 mA.  At this point I placed a shim into the L3-4 disc space through the lateral osteophytes, utilizing fluoroscopic guidance.  The retractor was then opened anteriorly, again 18 mm.  Utilizing a combination of osteotomes, curettes, and rongeurs, discectomy was completed.  Trials were again used to select a lordotic expandable cage.  This was packed with morselized bone allograft and BMP and tapped into place under fluoroscopic guidance.  The cage was then expanded in order to restore disc height.  The shim was removed and the retractor was close down and removed under direct vision with no active bleeding identified.  At this point the pre-bent lordotic rod measuring approximately 65 mm was selected and passed underneath the fascia.  Setscrews were then placed and final tightened.  Final AP and lateral fluoroscopic images were taken demonstrating excellent position of the implanted cages and pedicle screws and rod construct.  At this point the flank incision was closed in multiple layers in standard fashion utilizing a combination of interrupted 0 Vicryl stitches.  Similarly, the posterior incisions were also closed with interrupted 0 Vicryl stitches.  Skin was closed with staples.  The Schanz pin was removed and this was closed with 0 Vicryl and staples.  At the end of the case all sponge, needle, instrument, and cottonoid counts were correct.  Bacitracin  ointment and sterile dressings were placed.  The patient was then transferred to the stretcher and extubated. The patient was taken  to the postanesthesia care unit in stable hemodynamic condition.   Augusto Blonder, MD [facility]

## 2024-03-31 NOTE — Transfer of Care (Signed)
 Immediate Anesthesia Transfer of Care Note  Patient: Donna Vargas  Procedure(s) Performed: LEFT SIDED TRANSPSOAS APPROCH FOR FUSION LUMBAR THREE-FOUR/LUMBAR FOUR-FIVE WITH POSTERIOR PERCUTANEOUS PEDICLE SCREW STABILIZATION LUMBAR THREE-LUMBAR FIVE (Left) LUMBAR PERCUTANEOUS PEDICLE SCREW 2 LEVEL (Left) APPLICATION OF O-ARM  Patient Location: PACU  Anesthesia Type:General  Level of Consciousness: sedated  Airway & Oxygen Therapy: Patient Spontanous Breathing  Post-op Assessment: Report given to RN  Post vital signs: Reviewed and stable  Last Vitals:  Vitals Value Taken Time  BP 113/68   Temp    Pulse 62   Resp 20   SpO2 98     Last Pain:  Vitals:   03/31/24 0815  TempSrc: Oral  PainSc:       Patients Stated Pain Goal: 0 (03/31/24 0810)  Complications: No notable events documented.

## 2024-03-31 NOTE — Anesthesia Procedure Notes (Signed)
 Procedure Name: Intubation Date/Time: 03/31/2024 11:35 AM  Performed by: Hershall Lory, CRNAPre-anesthesia Checklist: Patient identified, Emergency Drugs available, Suction available and Patient being monitored Patient Re-evaluated:Patient Re-evaluated prior to induction Oxygen Delivery Method: Circle system utilized Preoxygenation: Pre-oxygenation with 100% oxygen Induction Type: IV induction Ventilation: Mask ventilation without difficulty Laryngoscope Size: Glidescope Tube type: Oral Tube size: 7.0 mm Number of attempts: 1 Airway Equipment and Method: Stylet and Oral airway Placement Confirmation: ETT inserted through vocal cords under direct vision, positive ETCO2 and breath sounds checked- equal and bilateral Secured at: 21 cm Tube secured with: Tape Dental Injury: Teeth and Oropharynx as per pre-operative assessment

## 2024-03-31 NOTE — Anesthesia Preprocedure Evaluation (Addendum)
 Anesthesia Evaluation  Patient identified by MRN, date of birth, ID band Patient awake    Reviewed: Allergy & Precautions, NPO status , Patient's Chart, lab work & pertinent test results  History of Anesthesia Complications Negative for: history of anesthetic complications  Airway Mallampati: II  TM Distance: >3 FB Neck ROM: Full    Dental  (+) Teeth Intact, Dental Advisory Given   Pulmonary neg pulmonary ROS   breath sounds clear to auscultation       Cardiovascular hypertension, Pt. on medications (-) angina  Rhythm:Regular Rate:Normal     Neuro/Psych   Anxiety Depression    Back pain    GI/Hepatic Neg liver ROS,GERD  Medicated and Controlled,,  Endo/Other  Hypothyroidism    Renal/GU negative Renal ROS     Musculoskeletal   Abdominal   Peds  Hematology negative hematology ROS (+) Hb 12.5, plt 206k   Anesthesia Other Findings   Reproductive/Obstetrics                             Anesthesia Physical Anesthesia Plan  ASA: 3  Anesthesia Plan: General   Post-op Pain Management: Ofirmev  IV (intra-op)*   Induction: Intravenous  PONV Risk Score and Plan: 3 and Ondansetron, Dexamethasone  and Treatment may vary due to age or medical condition  Airway Management Planned: Oral ETT  Additional Equipment: None  Intra-op Plan:   Post-operative Plan: Extubation in OR  Informed Consent: I have reviewed the patients History and Physical, chart, labs and discussed the procedure including the risks, benefits and alternatives for the proposed anesthesia with the patient or authorized representative who has indicated his/her understanding and acceptance.     Dental advisory given  Plan Discussed with: CRNA and Surgeon  Anesthesia Plan Comments:         Anesthesia Quick Evaluation

## 2024-03-31 NOTE — Anesthesia Postprocedure Evaluation (Signed)
 Anesthesia Post Note  Patient: Donna Vargas  Procedure(s) Performed: LEFT SIDED TRANSPSOAS APPROCH FOR FUSION LUMBAR THREE-FOUR/LUMBAR FOUR-FIVE WITH POSTERIOR PERCUTANEOUS PEDICLE SCREW STABILIZATION LUMBAR THREE-LUMBAR FIVE (Left) LUMBAR PERCUTANEOUS PEDICLE SCREW 2 LEVEL (Left) APPLICATION OF O-ARM     Patient location during evaluation: PACU Anesthesia Type: General Level of consciousness: sedated, patient cooperative and oriented Pain management: pain level controlled (pt improved, able to sleep) Vital Signs Assessment: post-procedure vital signs reviewed and stable Respiratory status: spontaneous breathing, nonlabored ventilation, respiratory function stable and patient connected to nasal cannula oxygen Cardiovascular status: blood pressure returned to baseline and stable Postop Assessment: no apparent nausea or vomiting Anesthetic complications: no   No notable events documented.  Last Vitals:  Vitals:   03/31/24 1730 03/31/24 1758  BP: 108/62 (!) 126/51  Pulse: 77 81  Resp: 15 18  Temp: (!) 36.3 C (!) 36.3 C  SpO2: 99% 100%               Adean Milosevic,E. Pellegrino Kennard

## 2024-04-01 ENCOUNTER — Other Ambulatory Visit: Payer: Self-pay

## 2024-04-01 MED ORDER — PROPOFOL 1000 MG/100ML IV EMUL
INTRAVENOUS | Status: AC
Start: 2024-04-01 — End: ?
  Filled 2024-04-01: qty 200

## 2024-04-01 MED ORDER — VASOPRESSIN 20 UNIT/ML IV SOLN
INTRAVENOUS | Status: AC
  Filled 2024-04-01: qty 1

## 2024-04-01 MED ORDER — PHENYLEPHRINE HCL-NACL 20-0.9 MG/250ML-% IV SOLN
INTRAVENOUS | Status: AC
  Filled 2024-04-01: qty 250

## 2024-04-01 NOTE — Evaluation (Addendum)
 Physical Therapy Evaluation Patient Details Name: Donna Vargas MRN: [id number] DOB: Jul 06, 1946 Today's Date: 04/01/2024  History of Present Illness  78 y.o. female s/p elective L-side transpsoas approach for complete discectomy, L3-5 fusion with interbody arthrodesis on 03/31/24. PMH includes HTN, osteopenia, prediabetes, skin CA.  Clinical Impression  Pt presents with an overall decrease in functional mobility secondary to above. PTA, pt independent, active, lives alone, drives; reports multiple family/friends nearby available to assist at d/c. Initiated educ re: spinal precautions, brace wear, positioning, activity recommendations and importance of mobility. Today, pt able to stand and take a few steps with RW and CGA, though limited ability to weight bear through LLE due to c/o significant pain (pt continues to state, "What is wrong with this leg? I think I waited too long to have this surgery"); difficult to determine how LLE symptoms compared to pre-op as pt just focused on current pain. Increased time discussing options for safe d/c home given current limitations since pt not interested in SNF; pt reports increased family assist available, agreeable to HHPT. Pt would benefit from continued acute PT services to maximize functional mobility and independence prior to d/c home.       If plan is discharge home, recommend the following: A little help with walking and/or transfers;A little help with bathing/dressing/bathroom;Assistance with cooking/housework;Assist for transportation;Help with stairs or ramp for entrance   Can travel by private vehicle    Yes    Equipment Recommendations None recommended by PT  Recommendations for Other Services   Occupational Therapy; Mobility Specialist   Functional Status Assessment Patient has had a recent decline in their functional status and demonstrates the ability to make significant improvements in function in a reasonable and predictable amount  of time.     Precautions / Restrictions Precautions Precautions: Back Recall of Precautions/Restrictions: Intact (decreased attention likely to affect education recall) Required Braces or Orthoses: Spinal Brace Spinal Brace: Lumbar corset;Applied in sitting position Restrictions Weight Bearing Restrictions Per Provider Order: No      Mobility  Bed Mobility Overal bed mobility: Needs Assistance Bed Mobility: Rolling, Sidelying to Sit Rolling: Supervision Sidelying to sit: Supervision       General bed mobility comments: bed flat, use of bed rail, cues for log roll, supervision for safety/precautions; increased time and effort secondary to LLE pain    Transfers Overall transfer level: Needs assistance Equipment used: Rolling walker (2 wheels) Transfers: Sit to/from Stand, Bed to chair/wheelchair/BSC Sit to Stand: Contact guard assist   Step pivot transfers: Contact guard assist       General transfer comment: multiple stands from EOB, BSC and recliner to RW with CGA, repeated cues for hand placement    Ambulation/Gait Ambulation/Gait assistance: Contact guard assist Gait Distance (Feet): 6 Feet Assistive device: Rolling walker (2 wheels) Gait Pattern/deviations: Step-to pattern, Decreased weight shift to left, Trunk flexed, Antalgic       General Gait Details: pivotal steps bed>BSC>recliner ~6' with RW and CGA; repeated verbal cues for sequencing as pt frequently removing hands from walker to rub L thigh  Stairs            Wheelchair Mobility     Tilt Bed    Modified Rankin (Stroke Patients Only)       Balance Overall balance assessment: Needs assistance Sitting-balance support: No upper extremity supported Sitting balance-Leahy Scale: Fair Sitting balance - Comments: minA to don brace sitting EOB   Standing balance support: Single extremity supported, Bilateral upper extremity supported, Reliant on  assistive device for balance Standing  balance-Leahy Scale: Poor                               Pertinent Vitals/Pain Pain Assessment Pain Assessment: Faces Faces Pain Scale: Hurts even more Pain Location: L thigh/hip Pain Descriptors / Indicators: Grimacing, Guarding, Sharp Pain Intervention(s): Premedicated before session    Home Living Family/patient expects to be discharged to:: Private residence Living Arrangements: Alone Available Help at Discharge: Family;Available 24 hours/day Type of Home: House Home Access: Ramped entrance       Home Layout: One level Home Equipment: Agricultural consultant (2 wheels);Cane - single point;Shower seat;Wheelchair - manual;Grab bars - toilet;Grab bars - tub/shower Additional Comments: reports multiple family and friends available to assist 24/7. lots of DME from her late mother    Prior Function Prior Level of Function : Independent/Modified Independent             Mobility Comments: indep without DME; intermittent use of SPC. lives alone, values her independence, drives. enjoys shopping at [employer] and eating out with friends. sleeps in lift recliner ADLs Comments: indep with ADL/iADLs     Extremity/Trunk Assessment   Upper Extremity Assessment Upper Extremity Assessment: Overall WFL for tasks assessed    Lower Extremity Assessment Lower Extremity Assessment: LLE deficits/detail;RLE deficits/detail RLE Deficits / Details: gross strength >/ 4/5; reports R thigh feels "funny" since surgery LLE Deficits / Details: gross strength >/ 3/5; reports LLE pain leading to sx       Communication   Communication Communication: No apparent difficulties    Cognition Arousal: Alert Behavior During Therapy: WFL for tasks assessed/performed   PT - Cognitive impairments: Attention                       PT - Cognition Comments: tangential with speech and easily distracted requiring frequent redirection to task and conversation; pt reports, "I'm  stubborn" Following commands: Intact       Cueing Cueing Techniques: Verbal cues     General Comments General comments (skin integrity, edema, etc.): educ re: role of acute PT, POC, spinal precautions, LSO wear, activity recommendations, positioning, potential d/c needs (SNF vs HHPT) given current impairments. pt declines SNF, reports plan for home; encouraged pt to have initial 24/7 assist set-up which she reports is available    Exercises     Assessment/Plan    PT Assessment Patient needs continued PT services  PT Problem List Decreased strength;Decreased range of motion;Decreased activity tolerance;Decreased balance;Decreased mobility;Decreased knowledge of use of DME;Decreased knowledge of precautions;Pain       PT Treatment Interventions DME instruction;Gait training;Stair training;Functional mobility training;Therapeutic activities;Therapeutic exercise;Balance training;Patient/family education    PT Goals (Current goals can be found in the Care Plan section)  Acute Rehab PT Goals Patient Stated Goal: return home and "talk to the doctor so I can figure out what is wrong with this leg" PT Goal Formulation: With patient Time For Goal Achievement: 04/15/24 Potential to Achieve Goals: Good    Frequency Min 5X/week     Co-evaluation               AM-PAC PT "6 Clicks" Mobility  Outcome Measure Help needed turning from your back to your side while in a flat bed without using bedrails?: A Little Help needed moving from lying on your back to sitting on the side of a flat bed without using bedrails?: A Little Help needed  moving to and from a bed to a chair (including a wheelchair)?: A Little Help needed standing up from a chair using your arms (e.g., wheelchair or bedside chair)?: A Little Help needed to walk in hospital room?: A Lot Help needed climbing 3-5 steps with a railing? : A Lot 6 Click Score: 16    End of Session Equipment Utilized During Treatment: Gait  belt;Back brace Activity Tolerance: Patient tolerated treatment well;Patient limited by pain Patient left: in chair;with call bell/phone within reach;with chair alarm set Nurse Communication: Mobility status PT Visit Diagnosis: Other abnormalities of gait and mobility (R26.89);Muscle weakness (generalized) (M62.81);Pain Pain - Right/Left: Left Pain - part of body: Leg    Time: 0800-0830 PT Time Calculation (min) (ACUTE ONLY): 30 min   Charges:   PT Evaluation $PT Eval Low Complexity: 1 Low PT Treatments $Therapeutic Activity: 8-22 mins PT General Charges $$ ACUTE PT VISIT: 1 Visit       Blase Bur, PT, DPT Acute Rehabilitation Services  Personal: Secure Chat Rehab Office: [phone]  Albino Hum 04/01/2024, 11:06 AM

## 2024-04-01 NOTE — Evaluation (Signed)
 Occupational Therapy Evaluation Patient Details Name: Donna Vargas MRN: [id number] DOB: 04-24-1946 Today's Date: 04/01/2024   History of Present Illness   78 y.o. female s/p elective L-side transpsoas approach for complete discectomy, L3-5 fusion with interbody arthrodesis on 03/31/24. PMH includes HTN, osteopenia, prediabetes, skin CA.     Clinical Impressions Pt c/o pain to LLE, increases with OOB activity but tolerates activity well. Pt lives at home alone, reports having 24/7 assistance from family if needed, plans to stay with son for a few days. Pt currently requires CGA for bed mobility, transfers using RW with increased effort. Pt not able to lift LLE to take steps, scoots it along the floor slowly. Pt max A for LB ADLs, has sock aide and adaptive equipment at home to assist with bathing/dressing, able to don/doff lumbar corset as needed. At this time recommending HHOT follow up to maximize functional strength and independence in home setting, has all DME needed to remain safe at home, will follow acutely to progress as able.      If plan is discharge home, recommend the following:   A little help with walking and/or transfers;A little help with bathing/dressing/bathroom;Assistance with cooking/housework;Assist for transportation;Help with stairs or ramp for entrance     Functional Status Assessment   Patient has had a recent decline in their functional status and demonstrates the ability to make significant improvements in function in a reasonable and predictable amount of time.     Equipment Recommendations   None recommended by OT     Recommendations for Other Services         Precautions/Restrictions   Precautions Precautions: Back Recall of Precautions/Restrictions: Intact Required Braces or Orthoses: Spinal Brace Spinal Brace: Lumbar corset;Applied in sitting position Restrictions Weight Bearing Restrictions Per Provider Order: No     Mobility  Bed Mobility Overal bed mobility: Needs Assistance Bed Mobility: Rolling, Sidelying to Sit Rolling: Supervision Sidelying to sit: Supervision       General bed mobility comments: bed flat, use of bed rail, cues for log roll, supervision for safety/precautions; increased time and effort secondary to LLE pain    Transfers Overall transfer level: Needs assistance Equipment used: Rolling walker (2 wheels) Transfers: Sit to/from Stand, Bed to chair/wheelchair/BSC Sit to Stand: Contact guard assist     Step pivot transfers: Contact guard assist     General transfer comment: CGA for safety, Pt slides LLE along floor, cannot lift up to take steps yet. Pt slow with transfer but at CGA level      Balance Overall balance assessment: Needs assistance Sitting-balance support: No upper extremity supported Sitting balance-Leahy Scale: Fair Sitting balance - Comments: sits statically EOB well.   Standing balance support: Single extremity supported, Bilateral upper extremity supported, Reliant on assistive device for balance Standing balance-Leahy Scale: Poor Standing balance comment: reliant on RW for support                           ADL either performed or assessed with clinical judgement   ADL Overall ADL's : Needs assistance/impaired Eating/Feeding: Independent   Grooming: Set up;Sitting   Upper Body Bathing: Set up;Sitting   Lower Body Bathing: Maximal assistance;Adhering to back precautions;Sitting/lateral leans;Sit to/from stand   Upper Body Dressing : Set up;Sitting   Lower Body Dressing: Maximal assistance;Adhering to back precautions;Sitting/lateral leans;Sit to/from stand   Toilet Transfer: Contact guard assist;Stand-pivot;BSC/3in1;Rolling walker (2 wheels)   Toileting- Clothing Manipulation and Hygiene: Minimal assistance;Sit to/from  stand       Functional mobility during ADLs: Contact guard assist;Rolling walker (2 wheels) General ADL Comments: Pt  requires mod-max A for LB dressing/bathing, back precautions, has sock aide at home she can use, adaptive equipement in shower to use if needed, help from family/friends 24/7     Vision Baseline Vision/History: 0 No visual deficits Ability to See in Adequate Light: 0 Adequate Patient Visual Report: No change from baseline       Perception         Praxis         Pertinent Vitals/Pain Pain Assessment Pain Assessment: Faces Faces Pain Scale: Hurts even more Pain Location: L thigh/hip Pain Descriptors / Indicators: Grimacing, Guarding, Sharp Pain Intervention(s): Monitored during session     Extremity/Trunk Assessment Upper Extremity Assessment Upper Extremity Assessment: Overall WFL for tasks assessed   Lower Extremity Assessment Lower Extremity Assessment: Defer to PT evaluation       Communication Communication Communication: No apparent difficulties   Cognition Arousal: Alert Behavior During Therapy: [facility] for tasks assessed/performed                                 Following commands: Intact       Cueing  General Comments   Cueing Techniques: Verbal cues      Exercises     Shoulder Instructions      Home Living Family/patient expects to be discharged to:: Private residence Living Arrangements: Alone Available Help at Discharge: Family;Available 24 hours/day Type of Home: House Home Access: Ramped entrance     Home Layout: One level     Bathroom Shower/Tub: Chief Strategy Officer: Handicapped height Bathroom Accessibility: Yes   Home Equipment: Agricultural consultant (2 wheels);Cane - single point;Shower seat;Wheelchair - manual;Grab bars - toilet;Grab bars - tub/shower   Additional Comments: reports multiple family and friends available to assist 24/7. lots of DME from her late mother      Prior Functioning/Environment Prior Level of Function : Independent/Modified Independent             Mobility Comments: indep  without DME; intermittent use of SPC. lives alone, values her independence, drives. enjoys shopping at [employer] and eating out with friends. sleeps in lift recliner ADLs Comments: indep with ADL/iADLs    OT Problem List: Decreased strength;Decreased activity tolerance;Impaired balance (sitting and/or standing);Pain   OT Treatment/Interventions: Self-care/ADL training;Therapeutic exercise;Therapeutic activities;Patient/family education;Balance training;Energy conservation;DME and/or AE instruction      OT Goals(Current goals can be found in the care plan section)   Acute Rehab OT Goals Patient Stated Goal: to return home OT Goal Formulation: With patient Time For Goal Achievement: 04/15/24 Potential to Achieve Goals: Good   OT Frequency:  Min 2X/week    Co-evaluation              AM-PAC OT "6 Clicks" Daily Activity     Outcome Measure Help from another person eating meals?: None Help from another person taking care of personal grooming?: A Little Help from another person toileting, which includes using toliet, bedpan, or urinal?: A Little Help from another person bathing (including washing, rinsing, drying)?: A Lot Help from another person to put on and taking off regular upper body clothing?: A Little Help from another person to put on and taking off regular lower body clothing?: A Lot 6 Click Score: 17   End of Session Equipment Utilized During Treatment: Gait belt;Rolling  walker (2 wheels) Nurse Communication: Mobility status  Activity Tolerance: Patient tolerated treatment well Patient left: in chair;with call bell/phone within reach;with chair alarm set  OT Visit Diagnosis: Unsteadiness on feet (R26.81);Other abnormalities of gait and mobility (R26.89);Muscle weakness (generalized) (M62.81);Pain Pain - Right/Left: Left Pain - part of body: Leg                Time: 3664-4034 OT Time Calculation (min): 28 min Charges:  OT General Charges $OT Visit: 1 Visit OT  Evaluation $OT Eval Low Complexity: 1 Low OT Treatments $Self Care/Home Management : 8-22 mins  [address], OTR/L   Scherry Curtis 04/01/2024, 3:28 PM

## 2024-04-01 NOTE — Progress Notes (Signed)
 Postop day 1.  Patient with left anterior hip pain and some hip flexor pain.  No radiating pain or numbness otherwise.  Wounds clean and dry.  Abdomen soft.  Status post prone Tran psoas lateral interbody decompression and fusion.  Patient with postoperative psoas muscle irritation and secondary pain.  Continue efforts at pain control and mobilization.

## 2024-04-01 NOTE — Plan of Care (Signed)
 Pt is alert and oriented x 3-4. Requiring pain medication every 2-4 hours. Pt taking oxy and morphine . Robaxin  given at hs. Removed foley at 0315 due to patient requested pain medication prior. Attempted to ambulate. Teaching log roll to patient and bed exiting. Pt sat on edge of bed. Aspen brace placed. When pt stood at bedside with walker severe pain noted to left leg. Pt reports this was the same pain prior. Pt unable to take any steps. Pt stood at bedside approx 7 min. Bed moved down and pt placed back in bed.  Problem: Education: Goal: Knowledge of General Education information will improve Description: Including pain rating scale, medication(s)/side effects and non-pharmacologic comfort measures Outcome: Progressing   Problem: Health Behavior/Discharge Planning: Goal: Ability to manage health-related needs will improve Outcome: Progressing   Problem: Clinical Measurements: Goal: Ability to maintain clinical measurements within normal limits will improve Outcome: Progressing Goal: Will remain free from infection Outcome: Progressing Goal: Diagnostic test results will improve Outcome: Progressing Goal: Respiratory complications will improve Outcome: Progressing Goal: Cardiovascular complication will be avoided Outcome: Progressing   Problem: Activity: Goal: Risk for activity intolerance will decrease Outcome: Progressing   Problem: Nutrition: Goal: Adequate nutrition will be maintained Outcome: Progressing   Problem: Coping: Goal: Level of anxiety will decrease Outcome: Progressing   Problem: Elimination: Goal: Will not experience complications related to bowel motility Outcome: Progressing Goal: Will not experience complications related to urinary retention Outcome: Progressing   Problem: Pain Managment: Goal: General experience of comfort will improve and/or be controlled Outcome: Progressing   Problem: Safety: Goal: Ability to remain free from injury will  improve Outcome: Progressing   Problem: Skin Integrity: Goal: Risk for impaired skin integrity will decrease Outcome: Progressing   Problem: Education: Goal: Ability to verbalize activity precautions or restrictions will improve Outcome: Progressing Goal: Knowledge of the prescribed therapeutic regimen will improve Outcome: Progressing Goal: Understanding of discharge needs will improve Outcome: Progressing   Problem: Activity: Goal: Ability to avoid complications of mobility impairment will improve Outcome: Progressing Goal: Ability to tolerate increased activity will improve Outcome: Progressing Goal: Will remain free from falls Outcome: Progressing   Problem: Bowel/Gastric: Goal: Gastrointestinal status for postoperative course will improve Outcome: Progressing   Problem: Clinical Measurements: Goal: Ability to maintain clinical measurements within normal limits will improve Outcome: Progressing Goal: Postoperative complications will be avoided or minimized Outcome: Progressing Goal: Diagnostic test results will improve Outcome: Progressing   Problem: Pain Management: Goal: Pain level will decrease Outcome: Progressing   Problem: Skin Integrity: Goal: Will show signs of wound healing Outcome: Progressing   Problem: Health Behavior/Discharge Planning: Goal: Identification of resources available to assist in meeting health care needs will improve Outcome: Progressing   Problem: Bladder/Genitourinary: Goal: Urinary functional status for postoperative course will improve Outcome: Progressing

## 2024-04-02 NOTE — Progress Notes (Signed)
 Physical Therapy Treatment Patient Details Name: Donna Vargas MRN: [id number] DOB: 1945/12/16 Today's Date: 04/02/2024   History of Present Illness 78 y.o. female s/p elective L-side transpsoas approach for complete discectomy, L3-5 fusion with interbody arthrodesis on 03/31/24. PMH includes HTN, osteopenia, prediabetes, skin CA.   PT Comments  Pt progressing with mobility. Today's session focused on transfer and gait training with RW; pt ambulating 20' + 20' with RW and CGA, prolonged seated rest breaks secondary to c/o pain and fatigue. Notable improvement in ability to take complete steps with LLE today, though still limited by pain. Pt reports plan for d/c to son's home for increased family support. Continue to recommend HHPT to maximize functional mobility and independence upon return home.     If plan is discharge home, recommend the following: A little help with walking and/or transfers;A little help with bathing/dressing/bathroom;Assistance with cooking/housework;Assist for transportation;Help with stairs or ramp for entrance   Can travel by private vehicle      Yes  Equipment Recommendations  None recommended by PT    Recommendations for Other Services  Mobility Specialist     Precautions / Restrictions Precautions Precautions: Back Recall of Precautions/Restrictions: Intact Required Braces or Orthoses: Spinal Brace Spinal Brace: Lumbar corset;Applied in sitting position     Mobility  Bed Mobility Overal bed mobility: Needs Assistance Bed Mobility: Rolling, Sidelying to Sit Rolling: Modified independent (Device/Increase time), Used rails Sidelying to sit: Min assist, Used rails       General bed mobility comments: mod indep rolling to R-side with bed; minA for HHA to elevate trunk    Transfers Overall transfer level: Needs assistance Equipment used: Rolling walker (2 wheels) Transfers: Sit to/from Stand, Bed to chair/wheelchair/BSC Sit to Stand: Contact  guard assist   Step pivot transfers: Contact guard assist       General transfer comment: initial stand and step pivot with walker bed>BSC due to urgency to void, CGA for balance, cues to take steps instead of sliding feet. additional sit<>stands from [facility] (1x) and recliner (2x) to RW, CGA with cues for hand placement. minA trunk elevation standing from chair without armrests    Ambulation/Gait   Gait Distance (Feet): 20 Feet (+ 20') Assistive device: Rolling walker (2 wheels) Gait Pattern/deviations: Step-to pattern, Step-through pattern, Decreased stride length, Trunk flexed, Antalgic Gait velocity: Decreased     General Gait Details: slow, guarded gait with RW and CGA, cues to increase step length and upright posture; ambulating 20' + 20' with prolonged seated rest break between secondary to c/o fatigue. further distance limited by multiple complaints (primarily pain, fatigue)   Stairs             Wheelchair Mobility     Tilt Bed    Modified Rankin (Stroke Patients Only)       Balance Overall balance assessment: Needs assistance Sitting-balance support: No upper extremity supported Sitting balance-Leahy Scale: Fair Sitting balance - Comments: performs pericare sitting on BSC; assist to adjust socks   Standing balance support: Single extremity supported, Bilateral upper extremity supported, Reliant on assistive device for balance, No upper extremity supported Standing balance-Leahy Scale: Fair Standing balance comment: can static stand without UE support but unable to tolerate challenge or take steps without RW support                            Communication Communication Communication: No apparent difficulties  Cognition Arousal: Alert Behavior During Therapy: [facility] for  tasks assessed/performed   PT - Cognitive impairments: Attention                       PT - Cognition Comments: easily distracted with some poor recall of education; pt  states she is going to do things how she wants to do them Following commands: Intact      Cueing Cueing Techniques: Verbal cues  Exercises      General Comments General comments (skin integrity, edema, etc.): pt with multiple complaints, including fatigue from not eating and pain from not moving much yesterday (also reports she did not ask to move nor want to secondary to pain). reviewed educ re: precautions, brace wear, importance of frequent mobility, safe d/c plan. pt reports plan to d/c to son's home for increased family support, will have necessary assist from them and DME      Pertinent Vitals/Pain Pain Assessment Pain Assessment: Faces Faces Pain Scale: Hurts even more Pain Location: L thigh/hip Pain Descriptors / Indicators: Grimacing, Guarding, Sore Pain Intervention(s): Monitored during session, RN gave pain meds during session    Home Living                          Prior Function            PT Goals (current goals can now be found in the care plan section) Progress towards PT goals: Progressing toward goals    Frequency    Min 5X/week      PT Plan      Co-evaluation              AM-PAC PT "6 Clicks" Mobility   Outcome Measure  Help needed turning from your back to your side while in a flat bed without using bedrails?: A Little Help needed moving from lying on your back to sitting on the side of a flat bed without using bedrails?: A Little Help needed moving to and from a bed to a chair (including a wheelchair)?: A Little Help needed standing up from a chair using your arms (e.g., wheelchair or bedside chair)?: A Little Help needed to walk in hospital room?: A Lot Help needed climbing 3-5 steps with a railing? : A Lot 6 Click Score: 16    End of Session Equipment Utilized During Treatment: Gait belt;Back brace Activity Tolerance: Patient tolerated treatment well;Patient limited by pain Patient left: in chair;with call bell/phone  within reach;with chair alarm set Nurse Communication: Mobility status PT Visit Diagnosis: Other abnormalities of gait and mobility (R26.89);Muscle weakness (generalized) (M62.81);Pain Pain - Right/Left: Left Pain - part of body: Leg     Time: 1610-9604 PT Time Calculation (min) (ACUTE ONLY): 26 min  Charges:    $Gait Training: 8-22 mins $Therapeutic Activity: 8-22 mins PT General Charges $$ ACUTE PT VISIT: 1 Visit                     Blase Bur, PT, DPT Acute Rehabilitation Services  Personal: Secure Chat Rehab Office: [phone]  Elspeth Blucher L Tiffanee Mcnee 04/02/2024, 11:30 AM

## 2024-04-02 NOTE — Progress Notes (Signed)
 Doing better today.  Still with quite a bit of left anterior hip pain with standing or walking but definitely better than yesterday.  Patient requests 1 more day in the hospital prior to discharge home.  Afebrile.  Vital signs are stable.  Awake and alert.  Oriented and appropriate.  Motor and sensory function intact.  Abdomen soft.  Wounds clean and dry.  Progressing reasonably well following two-level lumbar fusion.  Continue efforts at mobilization and pain control.  Likely discharge home tomorrow.

## 2024-04-02 NOTE — Progress Notes (Signed)
 Occupational Therapy Treatment Patient Details Name: Donna Vargas MRN: [id number] DOB: 1946-02-01 Today's Date: 04/02/2024   History of present illness 78 y.o. female s/p elective L-side transpsoas approach for complete discectomy, L3-5 fusion with interbody arthrodesis on 03/31/24. PMH includes HTN, osteopenia, prediabetes, skin CA.   OT comments  Pt. Seen for skilled OT treatment session.  Bed mobility S.  Donned brace with set up.  Short distance in room ambulation to/from b.room S.  Standing grooming task S.  Moving well and reports good family support at home and will be staying with her son.  Cont. With acute OT POC.       If plan is discharge home, recommend the following:  A little help with walking and/or transfers;A little help with bathing/dressing/bathroom;Assistance with cooking/housework;Assist for transportation;Help with stairs or ramp for entrance   Equipment Recommendations  None recommended by OT    Recommendations for Other Services      Precautions / Restrictions Precautions Precautions: Back Recall of Precautions/Restrictions: Intact Required Braces or Orthoses: Spinal Brace Spinal Brace: Lumbar corset;Applied in sitting position       Mobility Bed Mobility Overal bed mobility: Needs Assistance Bed Mobility: Sidelying to Sit, Sit to Sidelying   Sidelying to sit: Used rails, Supervision     Sit to sidelying: Supervision, Used rails General bed mobility comments: in R sidelying upon arrival, states she has rails available for us  at home so used today. in/out of bed in R sidelying beg. and end of session with S and cues for log roll tech. (hand placement, preventing twisting, and pushing through elbow)    Transfers Overall transfer level: Needs assistance Equipment used: Rolling walker (2 wheels) Transfers: Sit to/from Stand, Bed to chair/wheelchair/BSC Sit to Stand: Contact guard assist     Step pivot transfers: Contact guard assist            Balance                                           ADL either performed or assessed with clinical judgement   ADL Overall ADL's : Needs assistance/impaired     Grooming: Brushing hair;Standing;Supervision/safety         Lower Body Bathing Details (indicate cue type and reason): family able to assist as needed Upper Body Dressing : Set up;Sitting;Cueing for compensatory techniques;Cueing for sequencing Upper Body Dressing Details (indicate cue type and reason): donned brace, cues for keeping up with the velcro straps and placement Lower Body Dressing: Sitting/lateral leans;Maximal assistance Lower Body Dressing Details (indicate cue type and reason): reports family will be assisting prn Toilet Transfer: Supervision/safety;Ambulation;Rolling walker (2 wheels);BSC/3in1;Regular Teacher, adult education Details (indicate cue type and reason): 3n1 over commode, reports can have the same set up at home has the dme Toileting- Clothing Manipulation and Hygiene: Supervision/safety;Sitting/lateral lean;Sit to/from stand       Functional mobility during ADLs: Supervision/safety;Rolling walker (2 wheels)      Extremity/Trunk Assessment              Vision       Perception     Praxis     Communication Communication Communication: No apparent difficulties   Cognition Arousal: Alert Behavior During Therapy: [facility] for tasks assessed/performed  Following commands: Intact        Cueing   Cueing Techniques: Verbal cues  Exercises      Shoulder Instructions       General Comments pt with multiple complaints, including fatigue from not eating and pain from not moving much yesterday (also reports she did not ask to move nor want to secondary to pain). reviewed educ re: precautions, brace wear, importance of frequent mobility, safe d/c plan. pt reports plan to d/c to son's home for increased family support, will have  necessary assist from them and DME    Pertinent Vitals/ Pain       Pain Assessment Pain Assessment: No/denies pain  Home Living                                          Prior Functioning/Environment              Frequency  Min 2X/week        Progress Toward Goals  OT Goals(current goals can now be found in the care plan section)  Progress towards OT goals: Progressing toward goals     Plan      Co-evaluation                 AM-PAC OT "6 Clicks" Daily Activity     Outcome Measure   Help from another person eating meals?: None Help from another person taking care of personal grooming?: A Little Help from another person toileting, which includes using toliet, bedpan, or urinal?: A Little Help from another person bathing (including washing, rinsing, drying)?: A Lot Help from another person to put on and taking off regular upper body clothing?: A Little Help from another person to put on and taking off regular lower body clothing?: A Lot 6 Click Score: 17    End of Session Equipment Utilized During Treatment: Gait belt;Rolling walker (2 wheels)  OT Visit Diagnosis: Unsteadiness on feet (R26.81);Other abnormalities of gait and mobility (R26.89);Muscle weakness (generalized) (M62.81);Pain Pain - Right/Left: Left Pain - part of body: Leg   Activity Tolerance Patient tolerated treatment well   Patient Left in bed;with call bell/phone within reach;with bed alarm set;with family/visitor present   Nurse Communication Other (comment) (session details)        Time: 4696-2952 OT Time Calculation (min): 23 min  Charges: OT General Charges $OT Visit: 1 Visit OT Treatments $Self Care/Home Management : 23-37 mins  Howell Macintosh, COTA/L Acute Rehabilitation [phone]   Leory Rands Lorraine-COTA/L  04/02/2024, 3:06 PM

## 2024-04-03 ENCOUNTER — Other Ambulatory Visit (HOSPITAL_COMMUNITY): Payer: Self-pay

## 2024-04-03 LAB — GLUCOSE, CAPILLARY: Glucose-Capillary: 131 mg/dL — ABNORMAL HIGH (ref 70–99)

## 2024-04-03 MED ORDER — METHOCARBAMOL 500 MG PO TABS
500.0000 mg | ORAL_TABLET | Freq: Four times a day (QID) | ORAL | 2 refills | Status: DC | PRN
  Filled 2024-04-03: qty 120, 30d supply, fill #0

## 2024-04-03 MED ORDER — OXYCODONE HCL 5 MG PO TABS
5.0000 mg | ORAL_TABLET | ORAL | 0 refills | Status: DC | PRN
  Filled 2024-04-03: qty 30, 3d supply, fill #0

## 2024-04-03 MED ORDER — DOCUSATE SODIUM 100 MG PO CAPS
100.0000 mg | ORAL_CAPSULE | Freq: Two times a day (BID) | ORAL | 0 refills | Status: DC
  Filled 2024-04-03: qty 10, 5d supply, fill #0

## 2024-04-03 NOTE — TOC Transition Note (Signed)
 Transition of Care [facility]) - Discharge Note   Patient Details  Name: Donna Vargas MRN: [id number] Date of Birth: [birth year]  [facility]) CM/SW Contact:  Alisa App, RN Phone Number: 04/03/2024, 1:28 PM   Clinical Narrative:    Patient will DC to: home Anticipated DC date: 04/03/2024 Family notified: yes Transport by: car       -s/p Interbody arthrodesis, L3-4, L4-5  Per MD patient ready for DC today. RN, patient, and  patient's son notified of DC.   Pt with home health orders.Pt agreeable. Pt without provider preference.Referral made with [facility] and accepted. Pt without DME needs.  Post hospital f/u noted on AVS. Son to provide transportation to home.  RNCM will sign off for now as intervention is no longer needed. Please consult us  again if new needs arise.    Final next level of care: Home w Home Health Services Barriers to Discharge: No Barriers Identified   Patient Goals and CMS Choice     Choice offered to / list presented to : Patient      Discharge Placement                       Discharge Plan and Services Additional resources added to the After Visit Summary for                            [facility] Arranged: PT, OT HH Agency: [facility] Date [facility] Agency Contacted: 04/03/24 Time HH Agency Contacted: 1328 Representative spoke with at [facility] Agency: Loetta Ringer  Social Drivers of Health (SDOH) Interventions SDOH Screenings   Food Insecurity: No Food Insecurity (04/01/2024)  Housing: Low Risk  (04/01/2024)  Transportation Needs: No Transportation Needs (04/01/2024)  Utilities: Not At Risk (04/01/2024)  Alcohol Screen: Low Risk  (11/15/2023)  Depression (PHQ2-9): Low Risk  (02/28/2024)  Financial Resource Strain: Low Risk  (11/15/2023)  Physical Activity: Inactive (11/15/2023)  Social Connections: Moderately Integrated (04/01/2024)  Recent Concern: Social Connections - Moderately Isolated (04/01/2024)  Stress: No Stress  Concern Present (11/15/2023)  Tobacco Use: Low Risk  (03/31/2024)  Health Literacy: Adequate Health Literacy (11/15/2023)     Readmission Risk Interventions     No data to display

## 2024-04-03 NOTE — Discharge Summary (Signed)
 Patient ID: Donna Vargas MRN: [id number] DOB/AGE: 1946-07-28 77 y.o.  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 female who was admitted following a lumbar posterior/lateral fusion L3-5. They were recovered in PACU and transferred to the floor. She experienced anterior thigh dysesthesias postoperatively, which were improving at time of dc. Pt stable for discharge today. Pt to f/u in office for routine post op visit. Pt is in agreement w/ plan.    Discharge Exam: Blood pressure (!) 123/57, pulse 86, temperature 99 F (37.2 C), temperature source Oral, resp. rate 16, height 5\' 1"  (1.549 m), weight 67.6 kg, SpO2 96%. A&O Speech fluent, appropriate Strength/sensation grossly intact BUE/BLE.  SILTx4.  Dressings c/d/I.   Disposition:   Discharge Instructions     Incentive spirometry RT   Complete by: As directed       Allergies as of 04/03/2024       Reactions   Hydrochlorothiazide Hives, Rash   Sulfa Antibiotics Rash        Medication List     STOP taking these medications    traMADol 50 MG tablet Commonly known as: ULTRAM       TAKE these medications    acetaminophen  500 MG tablet Commonly known as: TYLENOL  Take 1 tablet (500 mg total) by mouth at bedtime. What changed:  when to take this reasons to take this   Allergy Relief Cetirizine  10 MG tablet Generic drug: cetirizine  TAKE 1 TABLET BY MOUTH EVERY DAY AT BEDTIME   amLODipine  10 MG tablet Commonly known as: NORVASC  Take 1 tablet (10 mg total) by mouth daily.   atorvastatin  20 MG tablet Commonly known as: LIPITOR Take 1 tablet (20 mg total) by mouth daily.   chlorhexidine  4 % external liquid Commonly known as: HIBICLENS  Apply 15 mLs (1 Application total) topically as directed for 30  doses. Use as directed daily for 5 days every other week for 6 weeks.   clonazePAM  0.5 MG tablet Commonly known as: KLONOPIN  TAKE 1 TABLET BY MOUTH EVERY DAY AT BEDTIME   cyanocobalamin  500 MCG tablet Commonly known as: V-R VITAMIN B-12 Take 1 tablet (500 mcg total) by mouth every Monday, Wednesday, and Friday.   docusate sodium  100 MG capsule Commonly known as: COLACE Take 1 capsule (100 mg total) by mouth 2 (two) times daily.   fluticasone  50 MCG/ACT nasal spray Commonly known as: FLONASE  Place 2 sprays into both nostrils daily. What changed:  when to take this reasons to take this   levothyroxine  75 MCG tablet Commonly known as: SYNTHROID  Take 1 tablet (75 mcg total) by mouth every morning.   methocarbamol  500 MG tablet Commonly known as: ROBAXIN  Take 1 tablet (500 mg total) by mouth every 6 (six) hours as needed for muscle spasms.   mupirocin ointment 2 % Commonly known as: BACTROBAN Place 1 Application into the nose 2 (two) times daily for 60 doses. Use as directed 2 times daily for 5 days every other week for 6 weeks.   omeprazole  40 MG capsule Commonly known as: PRILOSEC Take 1 capsule (40 mg total) by mouth every morning.   oxybutynin  5 MG 24 hr tablet Commonly known as: DITROPAN -XL Take 1 tablet (5 mg total) by mouth at bedtime.   oxyCODONE  5 MG immediate release tablet Commonly known as: Oxy IR/ROXICODONE  Take 1-2 tablets (5-10 mg total)  by mouth every 4 (four) hours as needed for moderate pain (pain score 4-6).   sertraline  100 MG tablet Commonly known as: ZOLOFT  Take 1 tablet (100 mg total) by mouth every morning.   Vitamin D  50 MCG (2000 UT) tablet Take 2,000 Units by mouth every morning.         Signed: Tatumn Corbridge CAYLIN Shanda Cadotte 04/03/2024, 10:03 AM

## 2024-04-03 NOTE — Progress Notes (Signed)
 Physical Therapy Treatment Patient Details Name: Donna Vargas MRN: [id number] DOB: [birth year] Today's Date: 04/03/2024   History of Present Illness 78 y.o. female s/p elective L-side transpsoas approach for complete discectomy, L3-5 fusion with interbody arthrodesis on 03/31/24. PMH includes HTN, osteopenia, prediabetes, skin CA.    PT Comments  PT returns at request of pt's son. Pt son expresses concern initially about pt's mobility and returning home with limited assistance of family and friends. Pt is able to perform all mobility during this session without physical assistance and independently dons brace. Pt's son appears more confident in the pt's mobility after this observation. PT continues to recommend discharge home with HHPT.    If plan is discharge home, recommend the following: A little help with walking and/or transfers;A little help with bathing/dressing/bathroom;Assistance with cooking/housework;Assist for transportation;Help with stairs or ramp for entrance   Can travel by private vehicle        Equipment Recommendations  None recommended by PT    Recommendations for Other Services       Precautions / Restrictions Precautions Precautions: Back Precaution Booklet Issued: No Recall of Precautions/Restrictions: Intact Precaution/Restrictions Comments: verbally reviewed precautions Required Braces or Orthoses: Spinal Brace Spinal Brace: Lumbar corset;Applied in sitting position Restrictions Weight Bearing Restrictions Per Provider Order: No     Mobility  Bed Mobility Overal bed mobility: Needs Assistance Bed Mobility: Rolling, Sidelying to Sit Rolling: Supervision Sidelying to sit: Supervision, Used rails     Sit to sidelying: Supervision      Transfers Overall transfer level: Needs assistance Equipment used: Rolling walker (2 wheels) Transfers: Sit to/from Stand Sit to Stand: Supervision                Ambulation/Gait Ambulation/Gait  assistance: Supervision Gait Distance (Feet): 60 Feet Assistive device: Rolling walker (2 wheels) Gait Pattern/deviations: Step-through pattern Gait velocity: reduced Gait velocity interpretation: <1.8 ft/sec, indicate of risk for recurrent falls   General Gait Details: slowed step-through gait   Stairs Stairs: Yes Stairs assistance: Contact guard assist Stair Management: Two rails, Forwards Number of Stairs: 2     Wheelchair Mobility     Tilt Bed    Modified Rankin (Stroke Patients Only)       Balance Overall balance assessment: Needs assistance Sitting-balance support: No upper extremity supported, Feet supported Sitting balance-Leahy Scale: Good     Standing balance support: Single extremity supported, Reliant on assistive device for balance Standing balance-Leahy Scale: Poor                              Communication Communication Communication: No apparent difficulties  Cognition Arousal: Alert Behavior During Therapy: WFL for tasks assessed/performed   PT - Cognitive impairments: No apparent impairments                         Following commands: Intact      Cueing Cueing Techniques: Verbal cues  Exercises      General Comments General comments (skin integrity, edema, etc.): VSS on RA      Pertinent Vitals/Pain Pain Assessment Pain Assessment: Faces Faces Pain Scale: Hurts little more Pain Location: LLE and back Pain Descriptors / Indicators: Sore Pain Intervention(s): Monitored during session    Home Living                          Prior Function  PT Goals (current goals can now be found in the care plan section) Acute Rehab PT Goals Patient Stated Goal: return home Progress towards PT goals: Progressing toward goals    Frequency    Min 5X/week      PT Plan      Co-evaluation              AM-PAC PT "6 Clicks" Mobility   Outcome Measure  Help needed turning from your back  to your side while in a flat bed without using bedrails?: A Little Help needed moving from lying on your back to sitting on the side of a flat bed without using bedrails?: A Little Help needed moving to and from a bed to a chair (including a wheelchair)?: A Little Help needed standing up from a chair using your arms (e.g., wheelchair or bedside chair)?: A Little Help needed to walk in hospital room?: A Little Help needed climbing 3-5 steps with a railing? : A Little 6 Click Score: 18    End of Session Equipment Utilized During Treatment: Back brace Activity Tolerance: Patient tolerated treatment well Patient left: in bed;with call bell/phone within reach Nurse Communication: Mobility status PT Visit Diagnosis: Other abnormalities of gait and mobility (R26.89);Muscle weakness (generalized) (M62.81);Pain Pain - Right/Left: Left Pain - part of body: Leg     Time: 1240-1250 PT Time Calculation (min) (ACUTE ONLY): 10 min  Charges:    $Gait Training: 8-22 mins $Therapeutic Activity: 8-22 mins PT General Charges $$ ACUTE PT VISIT: 1 Visit                     Rexie Catena, PT, DPT Acute Rehabilitation Office [phone]    Rexie Catena 04/03/2024, 1:00 PM

## 2024-04-03 NOTE — Progress Notes (Signed)
 Physical Therapy Treatment Patient Details Name: Donna Vargas MRN: [id number] DOB: [birth year] Today's Date: 04/03/2024   History of Present Illness 78 y.o. female s/p elective L-side transpsoas approach for complete discectomy, L3-5 fusion with interbody arthrodesis on 03/31/24. PMH includes HTN, osteopenia, prediabetes, skin CA.    PT Comments  Pt tolerates treatment well, ambulating for multiple trials and increased distances. Pt negotiates stairs without physical assistance. Pt is mobilizing well, PT recommends discharge home with HHPT when medically appropriate.    If plan is discharge home, recommend the following: A little help with walking and/or transfers;A little help with bathing/dressing/bathroom;Assistance with cooking/housework;Assist for transportation;Help with stairs or ramp for entrance   Can travel by private vehicle        Equipment Recommendations  None recommended by PT    Recommendations for Other Services       Precautions / Restrictions Precautions Precautions: Back Precaution Booklet Issued: No Recall of Precautions/Restrictions: Intact Precaution/Restrictions Comments: verbally reviewed precautions Required Braces or Orthoses: Spinal Brace Spinal Brace: Lumbar corset;Applied in sitting position Restrictions Weight Bearing Restrictions Per Provider Order: No     Mobility  Bed Mobility Overal bed mobility: Needs Assistance Bed Mobility: Rolling, Sidelying to Sit, Sit to Sidelying Rolling: Supervision Sidelying to sit: Supervision, Used rails     Sit to sidelying: Supervision      Transfers Overall transfer level: Needs assistance Equipment used: Rolling walker (2 wheels) Transfers: Sit to/from Stand Sit to Stand: Supervision                Ambulation/Gait Ambulation/Gait assistance: Supervision Gait Distance (Feet): 80 Feet (2 additional trials of 40 and 50') Assistive device: Rolling walker (2 wheels) Gait  Pattern/deviations: Step-through pattern Gait velocity: reduced Gait velocity interpretation: <1.8 ft/sec, indicate of risk for recurrent falls   General Gait Details: slowed step-through gait, reduced stride length   Stairs Stairs: Yes Stairs assistance: Contact guard assist Stair Management: Two rails, Forwards Number of Stairs: 2     Wheelchair Mobility     Tilt Bed    Modified Rankin (Stroke Patients Only)       Balance Overall balance assessment: Needs assistance Sitting-balance support: No upper extremity supported, Feet supported Sitting balance-Leahy Scale: Good     Standing balance support: Single extremity supported, Reliant on assistive device for balance Standing balance-Leahy Scale: Poor                              Communication Communication Communication: No apparent difficulties  Cognition Arousal: Alert Behavior During Therapy: WFL for tasks assessed/performed   PT - Cognitive impairments: No apparent impairments                         Following commands: Intact      Cueing Cueing Techniques: Verbal cues  Exercises      General Comments General comments (skin integrity, edema, etc.): VSS on RA      Pertinent Vitals/Pain Pain Assessment Pain Assessment: Faces Faces Pain Scale: Hurts even more Pain Location: back Pain Descriptors / Indicators: Sore Pain Intervention(s): Monitored during session    Home Living                          Prior Function            PT Goals (current goals can now be found in the care plan section) Acute  Rehab PT Goals Patient Stated Goal: return home Progress towards PT goals: Progressing toward goals    Frequency    Min 5X/week      PT Plan      Co-evaluation              AM-PAC PT "6 Clicks" Mobility   Outcome Measure  Help needed turning from your back to your side while in a flat bed without using bedrails?: A Little Help needed moving from  lying on your back to sitting on the side of a flat bed without using bedrails?: A Little Help needed moving to and from a bed to a chair (including a wheelchair)?: A Little Help needed standing up from a chair using your arms (e.g., wheelchair or bedside chair)?: A Little Help needed to walk in hospital room?: A Little Help needed climbing 3-5 steps with a railing? : A Little 6 Click Score: 18    End of Session Equipment Utilized During Treatment: Back brace Activity Tolerance: Patient tolerated treatment well Patient left: in bed;with call bell/phone within reach Nurse Communication: Mobility status PT Visit Diagnosis: Other abnormalities of gait and mobility (R26.89);Muscle weakness (generalized) (M62.81);Pain Pain - Right/Left: Left Pain - part of body: Leg     Time: 0930-1001 PT Time Calculation (min) (ACUTE ONLY): 31 min  Charges:    $Gait Training: 8-22 mins $Therapeutic Activity: 8-22 mins PT General Charges $$ ACUTE PT VISIT: 1 Visit                     Rexie Catena, PT, DPT Acute Rehabilitation Office [phone]    Rexie Catena 04/03/2024, 10:03 AM

## 2024-04-03 NOTE — Plan of Care (Signed)

## 2024-04-03 NOTE — Progress Notes (Signed)
 Pt discharged to home. DC instructions given with son at bedside. No concerns verbalized. Pt left unit in wheelchair pushed by son. Left instable condition.

## 2024-04-03 NOTE — Care Management Important Message (Signed)
 Important Message  Patient Details  Name: Donna Vargas MRN: [id number] Date of Birth: 1946/11/23   Important Message Given:  Yes - Medicare IM     Felix Host 04/03/2024, 4:05 PM

## 2024-04-04 ENCOUNTER — Encounter (HOSPITAL_COMMUNITY): Payer: Self-pay | Admitting: Neurosurgery

## 2024-04-04 DIAGNOSIS — K219 Gastro-esophageal reflux disease without esophagitis: Secondary | ICD-10-CM | POA: Diagnosis not present

## 2024-04-04 DIAGNOSIS — M19041 Primary osteoarthritis, right hand: Secondary | ICD-10-CM | POA: Diagnosis not present

## 2024-04-04 DIAGNOSIS — E559 Vitamin D deficiency, unspecified: Secondary | ICD-10-CM | POA: Diagnosis not present

## 2024-04-04 DIAGNOSIS — R7303 Prediabetes: Secondary | ICD-10-CM | POA: Diagnosis not present

## 2024-04-04 DIAGNOSIS — E785 Hyperlipidemia, unspecified: Secondary | ICD-10-CM | POA: Diagnosis not present

## 2024-04-04 DIAGNOSIS — H9319 Tinnitus, unspecified ear: Secondary | ICD-10-CM | POA: Diagnosis not present

## 2024-04-04 DIAGNOSIS — I10 Essential (primary) hypertension: Secondary | ICD-10-CM | POA: Diagnosis not present

## 2024-04-04 DIAGNOSIS — E039 Hypothyroidism, unspecified: Secondary | ICD-10-CM | POA: Diagnosis not present

## 2024-04-04 DIAGNOSIS — F331 Major depressive disorder, recurrent, moderate: Secondary | ICD-10-CM | POA: Diagnosis not present

## 2024-04-04 DIAGNOSIS — J45909 Unspecified asthma, uncomplicated: Secondary | ICD-10-CM | POA: Diagnosis not present

## 2024-04-04 DIAGNOSIS — K589 Irritable bowel syndrome without diarrhea: Secondary | ICD-10-CM | POA: Diagnosis not present

## 2024-04-04 DIAGNOSIS — Z981 Arthrodesis status: Secondary | ICD-10-CM | POA: Diagnosis not present

## 2024-04-04 DIAGNOSIS — M19042 Primary osteoarthritis, left hand: Secondary | ICD-10-CM | POA: Diagnosis not present

## 2024-04-04 DIAGNOSIS — R1310 Dysphagia, unspecified: Secondary | ICD-10-CM | POA: Diagnosis not present

## 2024-04-04 DIAGNOSIS — E041 Nontoxic single thyroid nodule: Secondary | ICD-10-CM | POA: Diagnosis not present

## 2024-04-04 DIAGNOSIS — M47816 Spondylosis without myelopathy or radiculopathy, lumbar region: Secondary | ICD-10-CM | POA: Diagnosis not present

## 2024-04-04 DIAGNOSIS — M8580 Other specified disorders of bone density and structure, unspecified site: Secondary | ICD-10-CM | POA: Diagnosis not present

## 2024-04-04 DIAGNOSIS — R32 Unspecified urinary incontinence: Secondary | ICD-10-CM | POA: Diagnosis not present

## 2024-04-04 DIAGNOSIS — Z79899 Other long term (current) drug therapy: Secondary | ICD-10-CM | POA: Diagnosis not present

## 2024-04-04 DIAGNOSIS — Z4789 Encounter for other orthopedic aftercare: Secondary | ICD-10-CM | POA: Diagnosis not present

## 2024-04-04 DIAGNOSIS — F419 Anxiety disorder, unspecified: Secondary | ICD-10-CM | POA: Diagnosis not present

## 2024-04-04 DIAGNOSIS — M4306 Spondylolysis, lumbar region: Secondary | ICD-10-CM | POA: Diagnosis not present

## 2024-04-04 DIAGNOSIS — K5900 Constipation, unspecified: Secondary | ICD-10-CM | POA: Diagnosis not present

## 2024-04-04 DIAGNOSIS — I6529 Occlusion and stenosis of unspecified carotid artery: Secondary | ICD-10-CM | POA: Diagnosis not present

## 2024-04-04 DIAGNOSIS — H6120 Impacted cerumen, unspecified ear: Secondary | ICD-10-CM | POA: Diagnosis not present

## 2024-04-05 ENCOUNTER — Other Ambulatory Visit (HOSPITAL_COMMUNITY): Payer: Self-pay

## 2024-04-06 DIAGNOSIS — J45909 Unspecified asthma, uncomplicated: Secondary | ICD-10-CM | POA: Diagnosis not present

## 2024-04-06 DIAGNOSIS — F331 Major depressive disorder, recurrent, moderate: Secondary | ICD-10-CM | POA: Diagnosis not present

## 2024-04-06 DIAGNOSIS — M47816 Spondylosis without myelopathy or radiculopathy, lumbar region: Secondary | ICD-10-CM | POA: Diagnosis not present

## 2024-04-06 DIAGNOSIS — F419 Anxiety disorder, unspecified: Secondary | ICD-10-CM | POA: Diagnosis not present

## 2024-04-06 DIAGNOSIS — M4306 Spondylolysis, lumbar region: Secondary | ICD-10-CM | POA: Diagnosis not present

## 2024-04-06 DIAGNOSIS — Z4789 Encounter for other orthopedic aftercare: Secondary | ICD-10-CM | POA: Diagnosis not present

## 2024-04-07 DIAGNOSIS — F331 Major depressive disorder, recurrent, moderate: Secondary | ICD-10-CM | POA: Diagnosis not present

## 2024-04-07 DIAGNOSIS — F419 Anxiety disorder, unspecified: Secondary | ICD-10-CM | POA: Diagnosis not present

## 2024-04-07 DIAGNOSIS — J45909 Unspecified asthma, uncomplicated: Secondary | ICD-10-CM | POA: Diagnosis not present

## 2024-04-07 DIAGNOSIS — Z4789 Encounter for other orthopedic aftercare: Secondary | ICD-10-CM | POA: Diagnosis not present

## 2024-04-07 DIAGNOSIS — M4306 Spondylolysis, lumbar region: Secondary | ICD-10-CM | POA: Diagnosis not present

## 2024-04-07 DIAGNOSIS — M47816 Spondylosis without myelopathy or radiculopathy, lumbar region: Secondary | ICD-10-CM | POA: Diagnosis not present

## 2024-04-10 ENCOUNTER — Telehealth: Payer: Self-pay

## 2024-04-10 DIAGNOSIS — M4306 Spondylolysis, lumbar region: Secondary | ICD-10-CM | POA: Diagnosis not present

## 2024-04-10 DIAGNOSIS — F419 Anxiety disorder, unspecified: Secondary | ICD-10-CM | POA: Diagnosis not present

## 2024-04-10 DIAGNOSIS — F331 Major depressive disorder, recurrent, moderate: Secondary | ICD-10-CM | POA: Diagnosis not present

## 2024-04-10 DIAGNOSIS — M47816 Spondylosis without myelopathy or radiculopathy, lumbar region: Secondary | ICD-10-CM | POA: Diagnosis not present

## 2024-04-10 DIAGNOSIS — Z4789 Encounter for other orthopedic aftercare: Secondary | ICD-10-CM | POA: Diagnosis not present

## 2024-04-10 DIAGNOSIS — J45909 Unspecified asthma, uncomplicated: Secondary | ICD-10-CM | POA: Diagnosis not present

## 2024-04-10 NOTE — Telephone Encounter (Signed)
 Copied from CRM [id number]. Topic: Clinical - Home Health Verbal Orders >> Apr 10, 2024 10:46 AM Adonis Hoot wrote: Caller/Agency: [facility] Callback Number: [phone]  Any new concerns about the patient? Yes Called to give update on Patients 4 surgical incisions,borders are red,staple intact and no drainage.  Patient does not have fever,normal temperature at 98

## 2024-04-10 NOTE — Telephone Encounter (Signed)
 Returned call to Donna Vargas/[facility] she thought she was reaching out to Careers adviser office. I have provided her with that information and she will reach out to them to give update. No further action needed from our office a this time.

## 2024-04-12 DIAGNOSIS — F419 Anxiety disorder, unspecified: Secondary | ICD-10-CM | POA: Diagnosis not present

## 2024-04-12 DIAGNOSIS — J45909 Unspecified asthma, uncomplicated: Secondary | ICD-10-CM | POA: Diagnosis not present

## 2024-04-12 DIAGNOSIS — F331 Major depressive disorder, recurrent, moderate: Secondary | ICD-10-CM | POA: Diagnosis not present

## 2024-04-12 DIAGNOSIS — Z4789 Encounter for other orthopedic aftercare: Secondary | ICD-10-CM | POA: Diagnosis not present

## 2024-04-12 DIAGNOSIS — M47816 Spondylosis without myelopathy or radiculopathy, lumbar region: Secondary | ICD-10-CM | POA: Diagnosis not present

## 2024-04-12 DIAGNOSIS — M4306 Spondylolysis, lumbar region: Secondary | ICD-10-CM | POA: Diagnosis not present

## 2024-04-13 ENCOUNTER — Other Ambulatory Visit: Payer: Self-pay | Admitting: Family Medicine

## 2024-04-13 DIAGNOSIS — F331 Major depressive disorder, recurrent, moderate: Secondary | ICD-10-CM

## 2024-04-13 DIAGNOSIS — M4306 Spondylolysis, lumbar region: Secondary | ICD-10-CM | POA: Diagnosis not present

## 2024-04-13 DIAGNOSIS — F419 Anxiety disorder, unspecified: Secondary | ICD-10-CM | POA: Diagnosis not present

## 2024-04-13 DIAGNOSIS — J45909 Unspecified asthma, uncomplicated: Secondary | ICD-10-CM | POA: Diagnosis not present

## 2024-04-13 DIAGNOSIS — Z4789 Encounter for other orthopedic aftercare: Secondary | ICD-10-CM | POA: Diagnosis not present

## 2024-04-13 DIAGNOSIS — M47816 Spondylosis without myelopathy or radiculopathy, lumbar region: Secondary | ICD-10-CM | POA: Diagnosis not present

## 2024-04-14 NOTE — Telephone Encounter (Signed)
 Name of Medication:  Clonazepam  Name of Pharmacy:  [facility] or [facility] Date and Quantity:  04/13/24, #30 Last Office Visit and Type:  02/28/24, 3 mop f/u Next Office Visit and Type:  08/01/24, 5 mp f/u Last Controlled Substance Agreement Date:  06/08/16 Last UDS:  06/08/16

## 2024-04-18 ENCOUNTER — Encounter (INDEPENDENT_AMBULATORY_CARE_PROVIDER_SITE_OTHER): Payer: Self-pay

## 2024-04-18 NOTE — Telephone Encounter (Signed)
 ERx

## 2024-04-19 DIAGNOSIS — M47816 Spondylosis without myelopathy or radiculopathy, lumbar region: Secondary | ICD-10-CM | POA: Diagnosis not present

## 2024-04-19 DIAGNOSIS — M48062 Spinal stenosis, lumbar region with neurogenic claudication: Secondary | ICD-10-CM | POA: Diagnosis not present

## 2024-04-19 DIAGNOSIS — Z6827 Body mass index (BMI) 27.0-27.9, adult: Secondary | ICD-10-CM | POA: Diagnosis not present

## 2024-05-09 DIAGNOSIS — R200 Anesthesia of skin: Secondary | ICD-10-CM | POA: Diagnosis not present

## 2024-06-23 DIAGNOSIS — G5711 Meralgia paresthetica, right lower limb: Secondary | ICD-10-CM | POA: Diagnosis not present

## 2024-07-04 DIAGNOSIS — Z961 Presence of intraocular lens: Secondary | ICD-10-CM | POA: Diagnosis not present

## 2024-07-04 DIAGNOSIS — H5213 Myopia, bilateral: Secondary | ICD-10-CM | POA: Diagnosis not present

## 2024-07-04 DIAGNOSIS — H04202 Unspecified epiphora, left lacrimal gland: Secondary | ICD-10-CM | POA: Diagnosis not present

## 2024-07-04 DIAGNOSIS — Z135 Encounter for screening for eye and ear disorders: Secondary | ICD-10-CM | POA: Diagnosis not present

## 2024-07-04 DIAGNOSIS — H524 Presbyopia: Secondary | ICD-10-CM | POA: Diagnosis not present

## 2024-07-04 DIAGNOSIS — H43813 Vitreous degeneration, bilateral: Secondary | ICD-10-CM | POA: Diagnosis not present

## 2024-07-04 DIAGNOSIS — H52223 Regular astigmatism, bilateral: Secondary | ICD-10-CM | POA: Diagnosis not present

## 2024-07-10 DIAGNOSIS — M48062 Spinal stenosis, lumbar region with neurogenic claudication: Secondary | ICD-10-CM | POA: Diagnosis not present

## 2024-07-10 DIAGNOSIS — Z6827 Body mass index (BMI) 27.0-27.9, adult: Secondary | ICD-10-CM | POA: Diagnosis not present

## 2024-07-10 DIAGNOSIS — G5711 Meralgia paresthetica, right lower limb: Secondary | ICD-10-CM | POA: Diagnosis not present

## 2024-08-01 ENCOUNTER — Ambulatory Visit: Admitting: Family Medicine

## 2024-08-01 ENCOUNTER — Encounter: Payer: Self-pay | Admitting: Family Medicine

## 2024-08-01 VITALS — BP 118/62 | HR 81 | Temp 98.0°F | Ht 61.0 in | Wt 151.1 lb

## 2024-08-01 DIAGNOSIS — M85851 Other specified disorders of bone density and structure, right thigh: Secondary | ICD-10-CM

## 2024-08-01 DIAGNOSIS — G8929 Other chronic pain: Secondary | ICD-10-CM

## 2024-08-01 DIAGNOSIS — M5442 Lumbago with sciatica, left side: Secondary | ICD-10-CM

## 2024-08-01 DIAGNOSIS — I10 Essential (primary) hypertension: Secondary | ICD-10-CM

## 2024-08-01 DIAGNOSIS — F331 Major depressive disorder, recurrent, moderate: Secondary | ICD-10-CM | POA: Diagnosis not present

## 2024-08-01 DIAGNOSIS — F411 Generalized anxiety disorder: Secondary | ICD-10-CM

## 2024-08-01 DIAGNOSIS — N3946 Mixed incontinence: Secondary | ICD-10-CM

## 2024-08-01 DIAGNOSIS — M5441 Lumbago with sciatica, right side: Secondary | ICD-10-CM

## 2024-08-01 DIAGNOSIS — R5382 Chronic fatigue, unspecified: Secondary | ICD-10-CM

## 2024-08-01 MED ORDER — CYANOCOBALAMIN 500 MCG PO TABS: 500.0000 ug | ORAL_TABLET | Freq: Every day | ORAL | Status: AC

## 2024-08-01 NOTE — Assessment & Plan Note (Signed)
 Notes improvement in this - now off oxybutynin .

## 2024-08-01 NOTE — Assessment & Plan Note (Signed)
Chronic, great control on current regimen.  

## 2024-08-01 NOTE — Assessment & Plan Note (Signed)
 Chronic, stable period on sertraline  100mg  daily. See below re benzo

## 2024-08-01 NOTE — Assessment & Plan Note (Signed)
 Ongoing difficulty.  Declines labs today - will update TSH next labwork at wellness visit 10/2024

## 2024-08-01 NOTE — Patient Instructions (Addendum)
 Try slowly cutting down on nightly clonazepam  use due to negative long term effects.  Update controlled substance agreement with clonazepam , UDS today.  Good to see you today Return in 4 months for wellness visit.   Reviewed 4 core lifestyle modifications to support a healthy mind:  1. Nutritious well balance diet.  2. Regular physical activity routine.  3. Regular mental activity such as reading books, word puzzles, math puzzles, jigsaw puzzles.  4. Social engagement.  Also ensure good blood pressure control, limit alcohol, no smoking.

## 2024-08-01 NOTE — Assessment & Plan Note (Signed)
 Reviewed chronic benzo use and how this is a controlled substance, reviewed risks of longterm use including memory difficulty, sedation, respiratory depression, and habit forming nature of medication as well as risk of tolerance /dependence.  Encouraged slowly cutting down on benzo use - try 1/2 tab at a time.  Forgot to update controlled substance agreement, UDS today - will need next time.

## 2024-08-01 NOTE — Progress Notes (Signed)
 Ph: [phone] Fax: [phone]   Patient ID: Donna Vargas, female    DOB: 10-13-1946, 78 y.o.   MRN: [id number]  This visit was conducted in person.  BP 118/62   Pulse 81   Temp 98 F (36.7 C) (Oral)   Ht 5' 1 (1.549 m)   Wt 151 lb 2 oz (68.5 kg)   SpO2 97%   BMI 28.55 kg/m    CC: 5 mo f/u visit  Subjective:   HPI: Donna Vargas is a 78 y.o. female presenting on 08/01/2024 for Medical Management of Chronic Issues (5 mth f/u visit)   Long-term on sertraline  100mg  daily, klonopin  0.5mg  nightly for anxiety. Takes klonopin  0.5mg  nightly for this as well. Discussed ideally would want to taper.   She stopped oxybutynin  XL. Voiding symptoms have improved.   DEXA 02/2024: T -1.5 RFN, not at increased fracture risk.   S/p lumbar posterior/lateral fusion L3-5 by Dr Lanis 03/2024.  Notes improvement in back pain.      Relevant past medical, surgical, family and social history reviewed and updated as indicated. Interim medical history since our last visit reviewed. Allergies and medications reviewed and updated. Outpatient Medications Prior to Visit  Medication Sig Dispense Refill   acetaminophen  (TYLENOL ) 500 MG tablet Take 1 tablet (500 mg total) by mouth at bedtime.     ALLERGY RELIEF CETIRIZINE  10 MG tablet TAKE 1 TABLET BY MOUTH EVERY DAY AT BEDTIME 30 tablet 10   amLODipine  (NORVASC ) 10 MG tablet Take 1 tablet (10 mg total) by mouth daily. 90 tablet 4   atorvastatin  (LIPITOR) 20 MG tablet Take 1 tablet (20 mg total) by mouth daily. 90 tablet 4   Cholecalciferol  (VITAMIN D ) 50 MCG (2000 UT) tablet Take 2,000 Units by mouth every morning.     clonazePAM  (KLONOPIN ) 0.5 MG tablet TAKE 1 TABLET BY MOUTH EVERY DAY AT BEDTIME 30 tablet 3   levothyroxine  (SYNTHROID ) 75 MCG tablet Take 1 tablet (75 mcg total) by mouth every morning. 90 tablet 4   sertraline  (ZOLOFT ) 100 MG tablet Take 1 tablet (100 mg total) by mouth every morning. 90 tablet 4    cyanocobalamin  (V-R VITAMIN B-12) 500 MCG tablet Take 1 tablet (500 mcg total) by mouth every Monday, Wednesday, and Friday.     fluticasone  (FLONASE ) 50 MCG/ACT nasal spray Place 2 sprays into both nostrils daily. (Patient taking differently: Place 2 sprays into both nostrils daily as needed for rhinitis or allergies.) 16 g 2   omeprazole  (PRILOSEC) 40 MG capsule Take 1 capsule (40 mg total) by mouth every morning. (Patient not taking: Reported on 08/01/2024) 90 capsule 4   traMADol (ULTRAM) 50 MG tablet Take 50 mg by mouth 3 (three) times daily as needed.     chlorhexidine  (HIBICLENS ) 4 % external liquid Apply 15 mLs (1 Application total) topically as directed for 30 doses. Use as directed daily for 5 days every other week for 6 weeks. (Patient not taking: Reported on 08/01/2024) 946 mL 1   docusate sodium  (COLACE) 100 MG capsule Take 1 capsule (100 mg total) by mouth 2 (two) times daily. (Patient not taking: Reported on 08/01/2024) 10 capsule 0   methocarbamol  (ROBAXIN ) 500 MG tablet Take 1 tablet (500 mg total) by mouth every 6 (six) hours as needed for muscle spasms. 120 tablet 2   oxybutynin  (DITROPAN -XL) 5 MG 24 hr tablet Take 1 tablet (5 mg total) by mouth at bedtime. (Patient not taking: Reported on 08/01/2024) 90 tablet 4  oxyCODONE  (OXY IR/ROXICODONE ) 5 MG immediate release tablet Take 1-2 tablets (5-10 mg total) by mouth every 4 (four) hours as needed for moderate pain (pain score 4-6). 30 tablet 0   No facility-administered medications prior to visit.     Per HPI unless specifically indicated in ROS section below Review of Systems  Objective:  BP 118/62   Pulse 81   Temp 98 F (36.7 C) (Oral)   Ht 5' 1 (1.549 m)   Wt 151 lb 2 oz (68.5 kg)   SpO2 97%   BMI 28.55 kg/m   Wt Readings from Last 3 Encounters:  08/01/24 151 lb 2 oz (68.5 kg)  03/31/24 149 lb (67.6 kg)  03/24/24 147 lb 11.2 oz (67 kg)      Physical Exam Vitals and nursing note reviewed.  Constitutional:       Appearance: Normal appearance. She is not ill-appearing.  HENT:     Head: Normocephalic and atraumatic.     Mouth/Throat:     Mouth: Mucous membranes are moist.     Pharynx: Oropharynx is clear. No oropharyngeal exudate or posterior oropharyngeal erythema.  Eyes:     Extraocular Movements: Extraocular movements intact.     Conjunctiva/sclera: Conjunctivae normal.     Pupils: Pupils are equal, round, and reactive to light.  Neck:     Thyroid : No thyroid  mass or thyromegaly.  Cardiovascular:     Rate and Rhythm: Normal rate and regular rhythm.     Pulses: Normal pulses.     Heart sounds: Normal heart sounds. No murmur heard. Pulmonary:     Effort: Pulmonary effort is normal. No respiratory distress.     Breath sounds: Normal breath sounds. No wheezing, rhonchi or rales.  Musculoskeletal:     Cervical back: Normal range of motion and neck supple.     Right lower leg: No edema.     Left lower leg: No edema.  Skin:    General: Skin is warm and dry.     Findings: No rash.  Neurological:     Mental Status: She is alert.  Psychiatric:        Mood and Affect: Mood normal.        Behavior: Behavior normal.       Results for orders placed or performed during the hospital encounter of 03/31/24  ABO/Rh   Collection Time: 03/31/24  8:30 AM  Result Value Ref Range   ABO/RH(D)      A POS Performed at [facility], [address] [place], [place] [address]   Glucose, capillary   Collection Time: 04/03/24  3:52 PM  Result Value Ref Range   Glucose-Capillary 131 (H) 70 - 99 mg/dL   Lab Results  Component Value Date   VITAMINB12 615 11/22/2023   Lab Results  Component Value Date   TSH 5.15 11/22/2023      08/01/2024   10:36 AM 02/28/2024    3:32 PM 11/15/2023    3:39 PM 06/25/2023   10:40 AM 05/26/2023   12:42 PM  Depression screen PHQ 2/9  Decreased Interest 0 0 0 1 1  Down, Depressed, Hopeless 2 0 0 1 1  PHQ - 2 Score 2 0 0 2 2  Altered sleeping 0 0  0 0  Tired,  decreased energy 2 2  2 1   Change in appetite 0 0  0 2  Feeling bad or failure about yourself  2 0  2 0  Trouble concentrating 2 0  2 0  Moving slowly or fidgety/restless 2 0  0 3  Suicidal thoughts 0 0  0 0  PHQ-9 Score 10 2  8 8   Difficult doing work/chores Not difficult at all Not difficult at all  Somewhat difficult Not difficult at all       08/01/2024   10:37 AM 02/28/2024    3:32 PM 06/25/2023   10:40 AM 05/26/2023   12:42 PM  GAD 7 : Generalized Anxiety Score  Nervous, Anxious, on Edge 1 2 3 2   Control/stop worrying 1 0 1 1  Worry too much - different things 1 2 2 1   Trouble relaxing 2 2 3 3   Restless 2 2 3 3   Easily annoyed or irritable 1 0 2 2  Afraid - awful might happen 0 0 1 0  Total GAD 7 Score 8 8 15 12   Anxiety Difficulty Not difficult at all Somewhat difficult Somewhat difficult Somewhat difficult   Assessment & Plan:   Problem List Items Addressed This Visit     MDD (major depressive disorder), recurrent episode, moderate (HCC)   Chronic, stable period on sertraline  100mg  daily. See below re benzo      Hypertension   Chronic, great control on current regimen.      Chronic bilateral low back pain with bilateral sciatica   Doing well after posterior/lateral lumbar fusion L3-5 03/2024.       Relevant Medications   traMADol (ULTRAM) 50 MG tablet   Mixed urge and stress incontinence   Notes improvement in this - now off oxybutynin .       Osteopenia   Reviewed latest bone density - continue calcium , vit D and regular weight bearing exercise.      Chronic fatigue   Ongoing difficulty.  Declines labs today - will update TSH next labwork at wellness visit 10/2024      GAD (generalized anxiety disorder) - Primary   Reviewed chronic benzo use and how this is a controlled substance, reviewed risks of longterm use including memory difficulty, sedation, respiratory depression, and habit forming nature of medication as well as risk of tolerance /dependence.   Encouraged slowly cutting down on benzo use - try 1/2 tab at a time.  Forgot to update controlled substance agreement, UDS today - will need next time.          Meds ordered this encounter  Medications   cyanocobalamin  (V-R VITAMIN B-12) 500 MCG tablet    Sig: Take 1 tablet (500 mcg total) by mouth daily.    No orders of the defined types were placed in this encounter.   Patient Instructions  Try slowly cutting down on nightly clonazepam  use due to negative long term effects.  Update controlled substance agreement with clonazepam , UDS today.  Good to see you today Return in 4 months for wellness visit.   Reviewed 4 core lifestyle modifications to support a healthy mind:  1. Nutritious well balance diet.  2. Regular physical activity routine.  3. Regular mental activity such as reading books, word puzzles, math puzzles, jigsaw puzzles.  4. Social engagement.  Also ensure good blood pressure control, limit alcohol, no smoking.    Follow up plan: Return in about 4 months (around 12/01/2024) for medicare wellness visit.  Anton Blas, MD

## 2024-08-01 NOTE — Assessment & Plan Note (Signed)
 Doing well after posterior/lateral lumbar fusion L3-5 03/2024.

## 2024-08-01 NOTE — Assessment & Plan Note (Signed)
 Reviewed latest bone density - continue calcium , vit D and regular weight bearing exercise.

## 2024-08-09 DIAGNOSIS — Z79899 Other long term (current) drug therapy: Secondary | ICD-10-CM | POA: Diagnosis not present

## 2024-08-09 DIAGNOSIS — M5416 Radiculopathy, lumbar region: Secondary | ICD-10-CM | POA: Diagnosis not present

## 2024-08-21 ENCOUNTER — Other Ambulatory Visit: Payer: Self-pay | Admitting: Family Medicine

## 2024-08-21 DIAGNOSIS — F331 Major depressive disorder, recurrent, moderate: Secondary | ICD-10-CM

## 2024-08-24 NOTE — Telephone Encounter (Signed)
 ERx

## 2024-09-04 ENCOUNTER — Telehealth: Payer: Self-pay | Admitting: Family Medicine

## 2024-09-04 NOTE — Telephone Encounter (Signed)
 Can we get more details regarding her request for her and her son? We have a counselor in the office.

## 2024-09-04 NOTE — Telephone Encounter (Signed)
 Copied from CRM [id number]. Topic: General - Call Back - No Documentation >> Sep 04, 2024  8:50 AM Precious C wrote: Reason for CRM: Patient called in requesting to speak with someone regarding therapy and psychiatry services for herself and her son. She would like a call back at [phone]. The patient also wanted to know if these types of treatments are offered at the clinic. I informed her that she may be referred to an appropriate provider for these services.

## 2024-11-16 ENCOUNTER — Ambulatory Visit

## 2024-11-16 VITALS — Ht 61.0 in | Wt 151.0 lb

## 2024-11-16 DIAGNOSIS — Z0000 Encounter for general adult medical examination without abnormal findings: Secondary | ICD-10-CM | POA: Diagnosis not present

## 2024-11-16 NOTE — Patient Instructions (Addendum)
 Donna Vargas,  Thank you for taking the time for your Medicare Wellness Visit. I appreciate your continued commitment to your health goals. Please review the care plan we discussed, and feel free to reach out if I can assist you further.  Please note that Annual Wellness Visits do not include a physical exam. Some assessments may be limited, especially if the visit was conducted virtually. If needed, we may recommend an in-person follow-up with your provider.  Ongoing Care Seeing your primary care provider every 3 to 6 months helps us  monitor your health and provide consistent, personalized care. Next office visit on 12/05/2024.    Referrals If a referral was made during today's visit and you haven't received any updates within two weeks, please contact the referred provider directly to check on the status.  Recommended Screenings:  Health Maintenance  Topic Date Due   Zoster (Shingles) Vaccine (1 of 2) Never done   COVID-19 Vaccine (3 - Pfizer risk series) 05/24/2020   DTaP/Tdap/Td vaccine (2 - Td or Tdap) 06/30/2024   Medicare Annual Wellness Visit  11/14/2024   Flu Shot  02/27/2025*   Breast Cancer Screening  02/15/2025   Pneumococcal Vaccine for age over 45  Completed   Osteoporosis screening with Bone Density Scan  Completed   Hepatitis C Screening  Completed   Meningitis B Vaccine  Aged Out   Colon Cancer Screening  Discontinued  *Topic was postponed. The date shown is not the original due date.       11/13/2024    9:50 AM  Advanced Directives  Does Patient Have a Medical Advance Directive? Yes  Type of Estate Agent of [place];Living will  Copy of Healthcare Power of Attorney in Chart? No - copy requested    Vision: Annual vision screenings are recommended for early detection of glaucoma, cataracts, and diabetic retinopathy. These exams can also reveal signs of chronic conditions such as diabetes and high blood pressure.  Dental: Annual dental  screenings help detect early signs of oral cancer, gum disease, and other conditions linked to overall health, including heart disease and diabetes.  Please see the attached documents for additional preventive care recommendations.

## 2024-11-16 NOTE — Progress Notes (Cosign Needed)
 Chief Complaint  Patient presents with   Medicare Wellness     Subjective:   Donna Vargas is a 78 y.o. female who presents for a Medicare Annual Wellness Visit.  Visit info / Clinical Intake: Medicare Wellness Visit Type:: Subsequent Annual Wellness Visit Persons participating in visit and providing information:: patient Medicare Wellness Visit Mode:: Telephone If telephone:: video declined Since this visit was completed virtually, some vitals may be partially provided or unavailable. Missing vitals are due to the limitations of the virtual format.: Unable to obtain vitals - no equipment If Telephone or Video please confirm:: I connected with patient using audio/video enable telemedicine. I verified patient identity with two identifiers, discussed telehealth limitations, and patient agreed to proceed. Patient Location:: Home Provider Location:: Office Interpreter Needed?: No Pre-visit prep was completed: yes AWV questionnaire completed by patient prior to visit?: yes Date:: 11/13/24 Living arrangements:: (!) (Patient-Rptd) lives alone Patient's Overall Health Status Rating: (Patient-Rptd) very good Typical amount of pain: (Patient-Rptd) some Does pain affect daily life?: (Patient-Rptd) no Are you currently prescribed opioids?: (!) yes  Dietary Habits and Nutritional Risks How many meals a day?: (Patient-Rptd) 2 Eats fruit and vegetables daily?: (!) (Patient-Rptd) no Most meals are obtained by: (Patient-Rptd) eating out In the last 2 weeks, have you had any of the following?: none Diabetic:: no  Functional Status Activities of Daily Living (to include ambulation/medication): (Patient-Rptd) Independent Ambulation: Independent with device- listed below Home Assistive Devices/Equipment: Eyeglasses Medication Administration: (Patient-Rptd) Independent Home Management (perform basic housework or laundry): (Patient-Rptd) Independent Manage your own finances?:  (Patient-Rptd) yes Primary transportation is: (Patient-Rptd) driving Concerns about vision?: no *vision screening is required for WTM* Concerns about hearing?: no  Fall Screening Falls in the past year?: (Patient-Rptd) 1 Number of falls in past year: (Patient-Rptd) 1 Was there an injury with Fall?: (Patient-Rptd) 0 Fall Risk Category Calculator: (Patient-Rptd) 2 Patient Fall Risk Level: (Patient-Rptd) Moderate Fall Risk  Fall Risk Patient at Risk for Falls Due to: Impaired balance/gait Fall risk Follow up: Falls evaluation completed; Falls prevention discussed  Home and Transportation Safety: All rugs have non-skid backing?: (!) (Patient-Rptd) no All stairs or steps have railings?: (Patient-Rptd) yes Grab bars in the bathtub or shower?: (Patient-Rptd) yes Have non-skid surface in bathtub or shower?: (Patient-Rptd) yes Good home lighting?: (Patient-Rptd) yes Regular seat belt use?: (Patient-Rptd) yes Hospital stays in the last year:: (!) (Patient-Rptd) yes How many hospital stays:: (Patient-Rptd) 1  Cognitive Assessment Difficulty concentrating, remembering, or making decisions? : yes (remembering) Will 6CIT or Mini Cog be Completed: yes What year is it?: 0 points What month is it?: 0 points Give patient an address phrase to remember (5 components): [address], [place], [place] About what time is it?: 0 points Count backwards from 20 to 1: 0 points Say the months of the year in reverse: 0 points Repeat the address phrase from earlier: 0 points 6 CIT Score: 0 points  Advance Directives (For Healthcare) Does Patient Have a Medical Advance Directive?: Yes Does patient want to make changes to medical advance directive?: No - Patient declined Type of Advance Directive: Healthcare Power of [place]; Living will; Out of facility DNR (pink MOST or yellow form) Copy of Healthcare Power of Attorney in Chart?: Yes - validated most recent copy scanned in chart (See row information) Copy  of Living Will in Chart?: No - copy requested Out of facility DNR (pink MOST or yellow form) in Chart? (Ambulatory ONLY): No - copy requested  Reviewed/Updated  Reviewed/Updated:  Reviewed All (Medical, Surgical, Family, Medications, Allergies, Care Teams, Patient Goals)    Allergies (verified) Hydrochlorothiazide and Sulfa antibiotics   Current Medications (verified) Outpatient Encounter Medications as of 11/16/2024  Medication Sig   acetaminophen  (TYLENOL ) 500 MG tablet Take 1 tablet (500 mg total) by mouth at bedtime. (Patient taking differently: Take 500 mg by mouth every 8 (eight) hours as needed.)   ALLERGY RELIEF CETIRIZINE  10 MG tablet TAKE 1 TABLET BY MOUTH EVERY DAY AT BEDTIME   amLODipine  (NORVASC ) 10 MG tablet Take 1 tablet (10 mg total) by mouth daily.   atorvastatin  (LIPITOR) 20 MG tablet Take 1 tablet (20 mg total) by mouth daily.   Cholecalciferol  (VITAMIN D ) 50 MCG (2000 UT) tablet Take 2,000 Units by mouth every morning.   clonazePAM  (KLONOPIN ) 0.5 MG tablet TAKE 1 TABLET BY MOUTH EVERY DAY AT BEDTIME   cyanocobalamin  (V-R VITAMIN B-12) 500 MCG tablet Take 1 tablet (500 mcg total) by mouth daily.   fluticasone  (FLONASE ) 50 MCG/ACT nasal spray Place 2 sprays into both nostrils daily. (Patient taking differently: Place 2 sprays into both nostrils daily as needed for rhinitis or allergies.)   levothyroxine  (SYNTHROID ) 75 MCG tablet Take 1 tablet (75 mcg total) by mouth every morning.   omeprazole  (PRILOSEC) 40 MG capsule Take 1 capsule (40 mg total) by mouth every morning.   sertraline  (ZOLOFT ) 100 MG tablet Take 1 tablet (100 mg total) by mouth every morning.   traMADol (ULTRAM) 50 MG tablet Take 50 mg by mouth 3 (three) times daily as needed.   No facility-administered encounter medications on file as of 11/16/2024.    History: Past Medical History:  Diagnosis Date   Abnormal drug screen 05/2016   inapprop negative klonopin  rpt 3 mo (05/2016)   Arthritis     fingers/hands, back   COVID-19 virus infection 10/2019   Depression with anxiety    Difficulty swallowing    Modified Barium Swallow Study Completed on 03/17/24   Dyspnea    with exertion   GERD (gastroesophageal reflux disease)    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; resolved per pt on 03/24/24   History of asthma 1990s   with bronchitis   History of diverticulitis    IBS   Hypertension    Hypothyroidism 2015   ?by prior PCP   Osteopenia 10/19/2019   DEXA 10/2019 T -1.2 hip (osteopenia)   Perennial allergic rhinitis    Hx of allergy shots (sharma)   Prediabetes 06/2014   no meds   Right thyroid  nodule 09/11/2014   S/p biopsy showing benign colloid nodule (08/2014)    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   Past Surgical History:  Procedure Laterality Date   ANTERIOR LAT LUMBAR FUSION Left 03/31/2024   Procedure: LEFT SIDED TRANSPSOAS APPROCH FOR FUSION LUMBAR THREE-FOUR/LUMBAR FOUR-FIVE WITH POSTERIOR PERCUTANEOUS PEDICLE SCREW STABILIZATION LUMBAR THREE-LUMBAR FIVE;  Surgeon: Lanis Pupa, MD;  Location: [facility] OR;  Service: Neurosurgery;  Laterality: Left;  LEFT SIDED TRANSPSOAS APPROCH FOR FUSION LUMBAR THREE-FOUR/LUMBAR FOUR-FIVE WITH POSTERIOR PERCUTANEOUS PEDICLE SCREW STABILIZATION LUMB   CATARACT EXTRACTION Left 03/31/2015   Groat   COLONOSCOPY  10/30/2008   WNL rpt 10 yrs Kristen)   COLONOSCOPY  02/16/2019   WNL, diverticulosis, rpt 10 yrs (Magod)   ESOPHAGOGASTRODUODENOSCOPY  10/30/2008   gastritis, CLO neg Kristen)   ESOPHAGOGASTRODUODENOSCOPY  02/16/2019   gastritis, o/w normal (Magod)   LUMBAR PERCUTANEOUS PEDICLE SCREW 2 LEVEL Left 03/31/2024   Procedure: LUMBAR PERCUTANEOUS  PEDICLE SCREW 2 LEVEL;  Surgeon: Lanis Pupa, MD;  Location: [facility] OR;  Service: Neurosurgery;  Laterality: Left;   OOPHORECTOMY  1990s   growth on ovary - benign   TOTAL ABDOMINAL HYSTERECTOMY  11/30/1993   both ovaries removed. CA125 level elevated - but no cancer   WISDOM  TOOTH EXTRACTION     Family History  Problem Relation Age of Onset   Cancer Mother        ovarian x2   Ovarian cancer Mother    CAD Father        unclear   Breast cancer Maternal Grandmother    CAD Maternal Grandfather    Breast cancer Cousin        maternal   Breast cancer Cousin        paternal   Hypertension Neg Hx    Diabetes Neg Hx    Social History   Occupational History   Occupation: RETIRED  Tobacco Use   Smoking status: Never   Smokeless tobacco: Never  Vaping Use   Vaping status: Never Used  Substance and Sexual Activity   Alcohol use: No    Alcohol/week: 0.0 standard drinks of alcohol   Drug use: No   Sexual activity: Not Currently    Birth control/protection: Surgical    Comment: Hysterectomy   Tobacco Counseling Counseling given: Not Answered  SDOH Screenings   Food Insecurity: No Food Insecurity (11/13/2024)  Housing: Low Risk (11/13/2024)  Transportation Needs: No Transportation Needs (11/13/2024)  Utilities: Not At Risk (11/16/2024)  Alcohol Screen: Low Risk (11/15/2023)  Depression (PHQ2-9): Low Risk (11/16/2024)  Financial Resource Strain: Medium Risk (11/13/2024)  Physical Activity: Unknown (11/13/2024)  Social Connections: Unknown (11/13/2024)  Stress: No Stress Concern Present (11/13/2024)  Tobacco Use: Low Risk (11/16/2024)  Health Literacy: Adequate Health Literacy (11/16/2024)   See flowsheets for full screening details  Depression Screen PHQ 2 & 9 Depression Scale- Over the past 2 weeks, how often have you been bothered by any of the following problems? Little interest or pleasure in doing things: 0 Feeling down, depressed, or hopeless (PHQ Adolescent also includes...irritable): 0 PHQ-2 Total Score: 0 Trouble falling or staying asleep, or sleeping too much: 0 Feeling tired or having little energy: 0 Poor appetite or overeating (PHQ Adolescent also includes...weight loss): 0 Feeling bad about yourself - or that you are a failure  or have let yourself or your family down: 0 Trouble concentrating on things, such as reading the newspaper or watching television (PHQ Adolescent also includes...like school work): 0 Moving or speaking so slowly that other people could have noticed. Or the opposite - being so fidgety or restless that you have been moving around a lot more than usual: 0 Thoughts that you would be better off dead, or of hurting yourself in some way: 0 PHQ-9 Total Score: 0 If you checked off any problems, how difficult have these problems made it for you to do your work, take care of things at home, or get along with other people?: Not difficult at all  Depression Treatment Depression Interventions/Treatment : EYV7-0 Score <4 Follow-up Not Indicated     Goals Addressed               This Visit's Progress     Patient Stated (pt-stated)        To stay healthy and continue to walk/2025             Objective:    Today's Vitals   11/16/24 1423  Weight: 151 lb (68.5 kg)  Height: 5' 1 (1.549 m)   Body mass index is 28.53 kg/m.  Hearing/Vision screen Hearing Screening - Comments:: Denies hearing difficulties   Vision Screening - Comments:: [facility]/UTD Immunizations and Health Maintenance Health Maintenance  Topic Date Due   Zoster Vaccines- Shingrix (1 of 2) Never done   COVID-19 Vaccine (3 - Pfizer risk series) 05/24/2020   DTaP/Tdap/Td (2 - Td or Tdap) 06/30/2024   Influenza Vaccine  02/27/2025 (Originally 06/30/2024)   Mammogram  02/15/2025   Medicare Annual Wellness (AWV)  11/16/2025   Pneumococcal Vaccine: 50+ Years  Completed   Bone Density Scan  Completed   Hepatitis C Screening  Completed   Meningococcal B Vaccine  Aged Out   Colonoscopy  Discontinued        Assessment/Plan:  This is a routine wellness examination for Donna Vargas.  Patient Care Team: Rilla Baller, MD as PCP - General (Family Medicine) Fate Morna SAILOR, [facility] (Inactive) as Pharmacist  (Pharmacist) Laurice Francis NOVAK, [place] (Optometry) Lanis Pupa, MD as Consulting Physician (Neurosurgery)  I have personally reviewed and noted the following in the patients chart:   Medical and social history Use of alcohol, tobacco or illicit drugs  Current medications and supplements including opioid prescriptions. Functional ability and status Nutritional status Physical activity Advanced directives List of other physicians Hospitalizations, surgeries, and ER visits in previous 12 months Vitals Screenings to include cognitive, depression, and falls Referrals and appointments  No orders of the defined types were placed in this encounter.  In addition, I have reviewed and discussed with patient certain preventive protocols, quality metrics, and best practice recommendations. A written personalized care plan for preventive services as well as general preventive health recommendations were provided to patient.   Garett Tetzloff L Claudis Giovanelli, CMA   11/16/2024   Return in 1 year (on 11/16/2025).  After Visit Summary: (MyChart) Due to this being a telephonic visit, the after visit summary with patients personalized plan was offered to patient via MyChart   Nurse Notes: Patient declines all due vaccines at this time.  She had no concerns to address today.

## 2024-11-17 ENCOUNTER — Other Ambulatory Visit: Payer: Self-pay | Admitting: Family Medicine

## 2024-11-17 DIAGNOSIS — K219 Gastro-esophageal reflux disease without esophagitis: Secondary | ICD-10-CM

## 2024-11-20 ENCOUNTER — Other Ambulatory Visit: Payer: Self-pay | Admitting: Family Medicine

## 2024-11-20 DIAGNOSIS — E039 Hypothyroidism, unspecified: Secondary | ICD-10-CM

## 2024-11-20 DIAGNOSIS — E785 Hyperlipidemia, unspecified: Secondary | ICD-10-CM

## 2024-11-20 DIAGNOSIS — F331 Major depressive disorder, recurrent, moderate: Secondary | ICD-10-CM

## 2024-11-20 DIAGNOSIS — I10 Essential (primary) hypertension: Secondary | ICD-10-CM

## 2024-11-20 NOTE — Telephone Encounter (Signed)
 ERx

## 2024-11-25 ENCOUNTER — Telehealth: Admitting: Nurse Practitioner

## 2024-11-26 ENCOUNTER — Other Ambulatory Visit: Payer: Self-pay | Admitting: Family Medicine

## 2024-11-26 DIAGNOSIS — M85859 Other specified disorders of bone density and structure, unspecified thigh: Secondary | ICD-10-CM

## 2024-11-26 DIAGNOSIS — E538 Deficiency of other specified B group vitamins: Secondary | ICD-10-CM

## 2024-11-26 DIAGNOSIS — E559 Vitamin D deficiency, unspecified: Secondary | ICD-10-CM

## 2024-11-26 DIAGNOSIS — R7303 Prediabetes: Secondary | ICD-10-CM

## 2024-11-26 DIAGNOSIS — E039 Hypothyroidism, unspecified: Secondary | ICD-10-CM

## 2024-11-26 DIAGNOSIS — E785 Hyperlipidemia, unspecified: Secondary | ICD-10-CM

## 2024-11-28 ENCOUNTER — Other Ambulatory Visit (INDEPENDENT_AMBULATORY_CARE_PROVIDER_SITE_OTHER)

## 2024-11-28 DIAGNOSIS — E785 Hyperlipidemia, unspecified: Secondary | ICD-10-CM | POA: Diagnosis not present

## 2024-11-28 DIAGNOSIS — R7303 Prediabetes: Secondary | ICD-10-CM | POA: Diagnosis not present

## 2024-11-28 DIAGNOSIS — E559 Vitamin D deficiency, unspecified: Secondary | ICD-10-CM

## 2024-11-28 DIAGNOSIS — E538 Deficiency of other specified B group vitamins: Secondary | ICD-10-CM

## 2024-11-28 DIAGNOSIS — M85859 Other specified disorders of bone density and structure, unspecified thigh: Secondary | ICD-10-CM | POA: Diagnosis not present

## 2024-11-28 DIAGNOSIS — E039 Hypothyroidism, unspecified: Secondary | ICD-10-CM | POA: Diagnosis not present

## 2024-11-28 LAB — COMPREHENSIVE METABOLIC PANEL WITH GFR
ALT: 11 U/L (ref 3–35)
AST: 16 U/L (ref 5–37)
Albumin: 4.2 g/dL (ref 3.5–5.2)
Alkaline Phosphatase: 102 U/L (ref 39–117)
BUN: 17 mg/dL (ref 6–23)
CO2: 31 meq/L (ref 19–32)
Calcium: 9.1 mg/dL (ref 8.4–10.5)
Chloride: 106 meq/L (ref 96–112)
Creatinine, Ser: 0.82 mg/dL (ref 0.40–1.20)
GFR: 69.6 mL/min
Glucose, Bld: 94 mg/dL (ref 70–99)
Potassium: 3.6 meq/L (ref 3.5–5.1)
Sodium: 144 meq/L (ref 135–145)
Total Bilirubin: 0.4 mg/dL (ref 0.2–1.2)
Total Protein: 7.6 g/dL (ref 6.0–8.3)

## 2024-11-28 LAB — CBC WITH DIFFERENTIAL/PLATELET
Basophils Absolute: 0 K/uL (ref 0.0–0.1)
Basophils Relative: 0.7 % (ref 0.0–3.0)
Eosinophils Absolute: 0.2 K/uL (ref 0.0–0.7)
Eosinophils Relative: 3.4 % (ref 0.0–5.0)
HCT: 38.3 % (ref 36.0–46.0)
Hemoglobin: 12.9 g/dL (ref 12.0–15.0)
Lymphocytes Relative: 23.8 % (ref 12.0–46.0)
Lymphs Abs: 1.4 K/uL (ref 0.7–4.0)
MCHC: 33.7 g/dL (ref 30.0–36.0)
MCV: 88.3 fl (ref 78.0–100.0)
Monocytes Absolute: 0.7 K/uL (ref 0.1–1.0)
Monocytes Relative: 11.5 % (ref 3.0–12.0)
Neutro Abs: 3.6 K/uL (ref 1.4–7.7)
Neutrophils Relative %: 60.6 % (ref 43.0–77.0)
Platelets: 204 K/uL (ref 150.0–400.0)
RBC: 4.34 Mil/uL (ref 3.87–5.11)
RDW: 13.8 % (ref 11.5–15.5)
WBC: 6 K/uL (ref 4.0–10.5)

## 2024-11-28 LAB — LIPID PANEL
Cholesterol: 161 mg/dL (ref 28–200)
HDL: 46.9 mg/dL
LDL Cholesterol: 86 mg/dL (ref 10–99)
NonHDL: 113.76
Total CHOL/HDL Ratio: 3
Triglycerides: 139 mg/dL (ref 10.0–149.0)
VLDL: 27.8 mg/dL (ref 0.0–40.0)

## 2024-11-28 LAB — VITAMIN D 25 HYDROXY (VIT D DEFICIENCY, FRACTURES): VITD: 35.82 ng/mL (ref 30.00–100.00)

## 2024-11-28 LAB — VITAMIN B12: Vitamin B-12: 657 pg/mL (ref 211–911)

## 2024-11-28 LAB — TSH: TSH: 8.06 u[IU]/mL — ABNORMAL HIGH (ref 0.35–5.50)

## 2024-11-28 LAB — HEMOGLOBIN A1C: Hgb A1c MFr Bld: 6.1 % (ref 4.6–6.5)

## 2024-11-29 ENCOUNTER — Encounter: Payer: Self-pay | Admitting: General Practice

## 2024-11-29 ENCOUNTER — Ambulatory Visit: Admitting: General Practice

## 2024-11-29 VITALS — BP 122/80 | HR 91 | Temp 97.7°F | Ht 61.0 in | Wt 153.4 lb

## 2024-11-29 DIAGNOSIS — R6884 Jaw pain: Secondary | ICD-10-CM | POA: Diagnosis not present

## 2024-11-29 DIAGNOSIS — K219 Gastro-esophageal reflux disease without esophagitis: Secondary | ICD-10-CM | POA: Diagnosis not present

## 2024-11-29 DIAGNOSIS — R051 Acute cough: Secondary | ICD-10-CM | POA: Diagnosis not present

## 2024-11-29 MED ORDER — PANTOPRAZOLE SODIUM 20 MG PO TBEC: 20.0000 mg | DELAYED_RELEASE_TABLET | Freq: Every day | ORAL | 0 refills | Status: DC

## 2024-11-29 NOTE — Progress Notes (Signed)
 "  Established Patient Office Visit  Subjective   Patient ID: Donna Vargas, female    DOB: [birth year]  Age: 78 y.o. MRN: [id number]  Chief Complaint  Patient presents with   Shortness of Breath    X a month or so. Patient states she can be singing and can not finish the song or sentence and has to take a deeper breath then usual. Patient states she noticed her lab results from yesterday and he TSH is very high.    Jaw Pain    On left side x 2 weeks. Patient states its worse at night and the pain is off and on.     Shortness of Breath Pertinent negatives include no abdominal pain, chest pain, ear pain, fever, headaches, sore throat or vomiting.    Discussed the use of AI scribe software for clinical note transcription with the patient, who gave verbal consent to proceed.  History of Present Illness Donna Vargas is a 78 year old female who presents with jaw pain and shortness of breath.  She has been experiencing intermittent left-sided jaw pain for the past two weeks, primarily located in the upper jaw. The pain sometimes occurs with pressure application. There is no history of injury, pain while chewing, or ear pain. She has a history of trouble swallowing, but previous tests showed no significant findings. She is currently on an antibiotic prescribed by an ENT specialist, though she does not recall the specific medication.  She reports shortness of breath and a cough persisting for about a month. She experiences difficulty completing long sentences and needs to pause to catch her breath, especially when singing in the choir or talking on the phone. She denies any history of smoking. She takes Prilosec for reflux and experiences frequent throat clearing.   In the review of symptoms, she reports chills, postnasal drip, and occasional 'funny' feelings in her head. No fever, sore throat, nasal congestion, chest pain, arm numbness, nausea, vomiting, diarrhea, constipation,  urinary symptoms, dizziness, or headaches.    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   Vitamin D  deficiency 08/19/2020   Low serum vitamin B12 06/12/2020   Chronic fatigue 06/12/2020   Tinnitus 11/21/2019   Osteopenia 10/19/2019   Carotid stenosis 08/09/2019   Lumbar herniated disc 07/19/2019   GERD (gastroesophageal reflux disease) 01/11/2019   Lumbar scoliosis 12/10/2018   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   Palpitations 09/06/2018   Abdominal bloating 12/06/2017   Hypothyroidism 08/18/2017   Hearing loss due to cerumen impaction 07/09/2017   Abnormal drug screen 05/30/2016   HLD (hyperlipidemia) 10/17/2015   Medicare annual wellness visit, subsequent 09/11/2014   Advanced care planning/counseling discussion 09/11/2014   Right thyroid  nodule 09/11/2014   Pain in lateral portion of right knee 08/31/2014   Surgical menopause 08/21/2014   Perennial allergic rhinitis    MDD (major depressive disorder), recurrent episode, moderate (HCC)    Osteoarthritis    Hypertension    Prediabetes 06/30/2014   Past Medical History:  Diagnosis Date   Abnormal drug screen 05/2016  inapprop negative klonopin  rpt 3 mo (05/2016)   Arthritis    fingers/hands, back   COVID-19 virus infection 10/2019   Depression with anxiety    Difficulty swallowing    Modified Barium Swallow Study Completed on 03/17/24   Dyspnea    with exertion   GERD (gastroesophageal reflux disease)    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; resolved per pt on 03/24/24    History of asthma 1990s   with bronchitis   History of diverticulitis    IBS   Hypertension    Hypothyroidism 2015   ?by prior PCP   Osteopenia 10/19/2019   DEXA 10/2019 T -1.2 hip (osteopenia)   Perennial allergic rhinitis    Hx of allergy shots (sharma)   Prediabetes 06/2014   no meds   Right thyroid  nodule 09/11/2014   S/p biopsy showing benign colloid nodule (08/2014)    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   Past Surgical History:  Procedure Laterality Date   ANTERIOR LAT LUMBAR FUSION Left 03/31/2024   Procedure: LEFT SIDED TRANSPSOAS APPROCH FOR FUSION LUMBAR THREE-FOUR/LUMBAR FOUR-FIVE WITH POSTERIOR PERCUTANEOUS PEDICLE SCREW STABILIZATION LUMBAR THREE-LUMBAR FIVE;  Surgeon: Lanis Pupa, MD;  Location: [facility] OR;  Service: Neurosurgery;  Laterality: Left;  LEFT SIDED TRANSPSOAS APPROCH FOR FUSION LUMBAR THREE-FOUR/LUMBAR FOUR-FIVE WITH POSTERIOR PERCUTANEOUS PEDICLE SCREW STABILIZATION LUMB   CATARACT EXTRACTION Left 03/31/2015   Groat   COLONOSCOPY  10/30/2008   WNL rpt 10 yrs Kristen)   COLONOSCOPY  02/16/2019   WNL, diverticulosis, rpt 10 yrs (Magod)   ESOPHAGOGASTRODUODENOSCOPY  10/30/2008   gastritis, CLO neg Kristen)   ESOPHAGOGASTRODUODENOSCOPY  02/16/2019   gastritis, o/w normal (Magod)   LUMBAR PERCUTANEOUS PEDICLE SCREW 2 LEVEL Left 03/31/2024   Procedure: LUMBAR PERCUTANEOUS PEDICLE SCREW 2 LEVEL;  Surgeon: Lanis Pupa, MD;  Location: [facility] OR;  Service: Neurosurgery;  Laterality: Left;   OOPHORECTOMY  1990s   growth on ovary - benign   TOTAL ABDOMINAL HYSTERECTOMY  11/30/1993   both ovaries removed. CA125 level elevated - but no cancer   WISDOM TOOTH EXTRACTION     Allergies[1]       11/29/2024    9:27 AM 11/16/2024    2:50 PM 08/01/2024   10:36 AM  Depression screen PHQ 2/9  Decreased Interest 0 0 0  Down, Depressed, Hopeless 0 0 2  PHQ - 2 Score 0 0 2  Altered sleeping 0 0 0  Tired, decreased energy 1 0 2  Change in appetite 1 0 0   Feeling bad or failure about yourself  0 0 2  Trouble concentrating 0 0 2  Moving slowly or fidgety/restless 1 0 2  Suicidal thoughts 0 0 0  PHQ-9 Score 3 0 10   Difficult doing work/chores Not difficult at all Not difficult at all Not difficult at all     Data saved with a previous flowsheet row definition       11/29/2024    9:27 AM 08/01/2024   10:37 AM 02/28/2024    3:32 PM 06/25/2023   10:40 AM  GAD 7 : Generalized Anxiety Score  Nervous, Anxious, on Edge 1 1 2 3   Control/stop worrying 0 1 0 1  Worry too much - different things 0 1 2 2   Trouble relaxing 1 2 2 3   Restless 1 2 2 3   Easily annoyed or irritable 0 1 0 2  Afraid - awful might happen 0 0 0 1  Total GAD 7  Score 3 8 8 15   Anxiety Difficulty Not difficult at all Not difficult at all Somewhat difficult Somewhat difficult      Review of Systems  Constitutional:  Negative for chills and fever.  HENT:  Negative for congestion, ear pain and sore throat.   Respiratory:  Positive for shortness of breath.   Cardiovascular:  Negative for chest pain.  Gastrointestinal:  Negative for abdominal pain, constipation, diarrhea, heartburn, nausea and vomiting.  Genitourinary:  Negative for dysuria, frequency and urgency.  Neurological:  Negative for dizziness and headaches.  Endo/Heme/Allergies:  Negative for polydipsia.  Psychiatric/Behavioral:  Negative for depression and suicidal ideas. The patient is not nervous/anxious.       Objective:     BP 122/80   Pulse 91   Temp 97.7 F (36.5 C) (Temporal)   Ht 5' 1 (1.549 m)   Wt 153 lb 6.4 oz (69.6 kg)   SpO2 99%   BMI 28.98 kg/m  BP Readings from Last 3 Encounters:  11/29/24 122/80  08/01/24 118/62  04/03/24 (!) 120/43   Wt Readings from Last 3 Encounters:  11/29/24 153 lb 6.4 oz (69.6 kg)  11/16/24 151 lb (68.5 kg)  08/01/24 151 lb 2 oz (68.5 kg)      Physical Exam Vitals and nursing note reviewed.  Constitutional:      Appearance: Normal appearance.   HENT:     Right Ear: Tympanic membrane, ear canal and external ear normal.     Left Ear: Tympanic membrane, ear canal and external ear normal.     Nose: No congestion or rhinorrhea.     Mouth/Throat:     Pharynx: Oropharynx is clear. No oropharyngeal exudate or posterior oropharyngeal erythema.  Eyes:     Conjunctiva/sclera: Conjunctivae normal.  Cardiovascular:     Rate and Rhythm: Normal rate and regular rhythm.     Pulses: Normal pulses.     Heart sounds: Normal heart sounds.  Pulmonary:     Effort: Pulmonary effort is normal. No respiratory distress.     Breath sounds: Normal breath sounds. No wheezing.  Neurological:     Mental Status: She is alert and oriented to person, place, and time.  Psychiatric:        Mood and Affect: Mood normal.        Behavior: Behavior normal.        Thought Content: Thought content normal.        Judgment: Judgment normal.      No results found for any visits on 11/29/24.     The 10-year ASCVD risk score (Arnett DK, et al., 2019) is: 21%    Assessment & Plan:  Pain in upper jaw  Gastroesophageal reflux disease without esophagitis -     Pantoprazole  Sodium; Take 1 tablet (20 mg total) by mouth daily.  Dispense: 30 tablet; Refill: 0  Acute cough Assessment & Plan: Like secondary to GERD.  Low suspicion of bacterial involvement as she is currently on antibiotic per ENT. She is not sure which one.   Consider chest x-ray if not better.  Strict ER precautions provided.      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 No dental infection or acute dental issues. - Take Tylenol  as needed for pain. - Apply warm compresses to the affected area. - Monitor for persistent pain or new symptoms such as chest pain,  shortness of breath, or confusion, and seek emergency care if these  occur.  Gastroesophageal reflux disease Chronic cough and shortness of breath likely related to GERD. Symptoms suggest inadequate control with Prilosec. Anxiety may contribute. Low suspicion of infection-related causes. - Lungs clear. Currently on antibiotics, low suspicion of bacterial involvement. - Discontinued Prilosec. - Started pantoprazole  20 mg once daily. - Follow up with primary care physician in four weeks to assess response to pantoprazole .   Return if symptoms worsen or fail to improve.    Carrol Aurora, NP     [1]  Allergies Allergen Reactions   Hydrochlorothiazide Hives and Rash   Sulfa Antibiotics Rash   "

## 2024-11-29 NOTE — Assessment & Plan Note (Signed)
 Like secondary to GERD.  Low suspicion of bacterial involvement as she is currently on antibiotic per ENT. She is not sure which one.   Consider chest x-ray if not better.  Strict ER precautions provided.

## 2024-11-29 NOTE — Patient Instructions (Signed)
 Discontinue Prilosec.   Start Pantoprazole  20 mg once daily.   Take tylenol  as needed for jaw pain; you can also use warm compress.  If you develop chest pain, shortness of breath, difficulty breathing, confusion, please go to the ER.   Follow up with Dr. KANDICE as scheduled.   It was a pleasure to see you today!

## 2024-12-02 ENCOUNTER — Ambulatory Visit: Payer: Self-pay | Admitting: Family Medicine

## 2024-12-05 ENCOUNTER — Encounter: Payer: Self-pay | Admitting: Family Medicine

## 2024-12-05 ENCOUNTER — Ambulatory Visit
Admission: RE | Admit: 2024-12-05 | Discharge: 2024-12-05 | Disposition: A | Source: Ambulatory Visit | Attending: Family Medicine | Admitting: Family Medicine

## 2024-12-05 ENCOUNTER — Ambulatory Visit: Admitting: Family Medicine

## 2024-12-05 VITALS — BP 120/70 | HR 83 | Temp 98.4°F | Ht 61.75 in | Wt 155.6 lb

## 2024-12-05 DIAGNOSIS — E785 Hyperlipidemia, unspecified: Secondary | ICD-10-CM | POA: Diagnosis not present

## 2024-12-05 DIAGNOSIS — E039 Hypothyroidism, unspecified: Secondary | ICD-10-CM | POA: Diagnosis not present

## 2024-12-05 DIAGNOSIS — M85859 Other specified disorders of bone density and structure, unspecified thigh: Secondary | ICD-10-CM

## 2024-12-05 DIAGNOSIS — E538 Deficiency of other specified B group vitamins: Secondary | ICD-10-CM

## 2024-12-05 DIAGNOSIS — R0602 Shortness of breath: Secondary | ICD-10-CM

## 2024-12-05 DIAGNOSIS — R7303 Prediabetes: Secondary | ICD-10-CM

## 2024-12-05 DIAGNOSIS — E559 Vitamin D deficiency, unspecified: Secondary | ICD-10-CM

## 2024-12-05 DIAGNOSIS — I10 Essential (primary) hypertension: Secondary | ICD-10-CM

## 2024-12-05 DIAGNOSIS — F331 Major depressive disorder, recurrent, moderate: Secondary | ICD-10-CM | POA: Diagnosis not present

## 2024-12-05 DIAGNOSIS — F411 Generalized anxiety disorder: Secondary | ICD-10-CM | POA: Diagnosis not present

## 2024-12-05 DIAGNOSIS — Z7189 Other specified counseling: Secondary | ICD-10-CM

## 2024-12-05 DIAGNOSIS — R051 Acute cough: Secondary | ICD-10-CM

## 2024-12-05 DIAGNOSIS — K219 Gastro-esophageal reflux disease without esophagitis: Secondary | ICD-10-CM | POA: Diagnosis not present

## 2024-12-05 MED ORDER — OMEPRAZOLE 40 MG PO CPDR: 40.0000 mg | DELAYED_RELEASE_CAPSULE | Freq: Every day | ORAL | 3 refills | Status: AC

## 2024-12-05 MED ORDER — PREDNISONE 20 MG PO TABS: ORAL_TABLET | ORAL | 0 refills | Status: AC

## 2024-12-05 MED ORDER — AZITHROMYCIN 250 MG PO TABS: ORAL_TABLET | ORAL | 0 refills | Status: AC

## 2024-12-05 MED ORDER — LEVOTHYROXINE SODIUM 88 MCG PO TABS: 88.0000 ug | ORAL_TABLET | Freq: Every morning | ORAL | 3 refills | Status: AC

## 2024-12-05 MED ORDER — AMLODIPINE BESYLATE 10 MG PO TABS: 10.0000 mg | ORAL_TABLET | Freq: Every day | ORAL | 3 refills | Status: AC

## 2024-12-05 MED ORDER — ATORVASTATIN CALCIUM 20 MG PO TABS: 20.0000 mg | ORAL_TABLET | Freq: Every day | ORAL | 3 refills | Status: AC

## 2024-12-05 MED ORDER — ALBUTEROL SULFATE HFA 108 (90 BASE) MCG/ACT IN AERS: 1.0000 | INHALATION_SPRAY | Freq: Four times a day (QID) | RESPIRATORY_TRACT | 0 refills | Status: AC | PRN

## 2024-12-05 MED ORDER — SERTRALINE HCL 100 MG PO TABS: 100.0000 mg | ORAL_TABLET | Freq: Every morning | ORAL | 3 refills | Status: AC

## 2024-12-05 NOTE — Patient Instructions (Addendum)
 Medicines refilled to [facility] I have increased levothyroxine  to 88mcg daily - higher dose. Schedule lab visit 6 weeks after starting higher dose to recheck thyroid  function.  For cough and shortness of breath - chest xray today, start prednisone  taper and zpack antibiotics sent to pharmacy. You may use albuterol  rescue inhaler as needed for wheezing or shortness of breath 1-2 puffs every 6 hours as needed - this can make you jittery/increase heart rate.  Let me know if not improving with treatment.  Return in 4 months for follow up visit

## 2024-12-05 NOTE — Assessment & Plan Note (Signed)
"  Chronic, stable.  Continue current regimen.  "

## 2024-12-05 NOTE — Assessment & Plan Note (Signed)
 Chronic, stable period on atorvastatin  - continue. The 10-year ASCVD risk score (Arnett DK, et al., 2019) is: 20.4%   Values used to calculate the score:     Age: 79 years     Clinically relevant sex: Female     Is Non-Hispanic African American: No     Diabetic: No     Tobacco smoker: No     Systolic Blood Pressure: 120 mmHg     Is BP treated: Yes     HDL Cholesterol: 46.9 mg/dL     Total Cholesterol: 161 mg/dL

## 2024-12-05 NOTE — Assessment & Plan Note (Signed)
Chronic, stable period on sertraline - continue.

## 2024-12-05 NOTE — Assessment & Plan Note (Signed)
 Continues omeprazole 40mg  daily.

## 2024-12-05 NOTE — Assessment & Plan Note (Addendum)
 Present for the past 5 days, with abnormal lung sounds - suspect bronchitis with possible reactive airways No known h/o asthma, COPD, smoking history although she endorses 2nd hand smoke exposure.  Check CXR today, Rx prednisone , azithromycin , albuterol  inhaler.  Update if not improving to consider further imaging.

## 2024-12-05 NOTE — Assessment & Plan Note (Signed)
 Stable period on vit D 2000 units daily.

## 2024-12-05 NOTE — Assessment & Plan Note (Signed)
 Continue calcium  ,vit D and regular weight bearing exercise.

## 2024-12-05 NOTE — Assessment & Plan Note (Signed)
 Stable period on b12 replacement - continue this.

## 2024-12-05 NOTE — Progress Notes (Signed)
 " Ph: [phone] Fax: [phone]   Patient ID: Donna Vargas, female    DOB: Jul 06, 1946, 79 y.o.   MRN: [id number]  This visit was conducted in person.  BP 120/70 (Cuff Size: Normal)   Pulse 83   Temp 98.4 F (36.9 C) (Oral)   Ht 5' 1.75 (1.568 m)   Wt 155 lb 9.6 oz (70.6 kg)   SpO2 97%   BMI 28.69 kg/m    CC: CPE Subjective:   HPI: Donna Vargas is a 79 y.o. female presenting on 12/05/2024 for Annual Exam (Pt reports SOB, malaise, onset 1 month//Wet cough, head stopped up onset 11/30/24, but states she is feeling better now)   Saw health advisor 10/2024 for medicare wellness visit. Note reviewed.   No results found.  Flowsheet Row Office Visit from 12/05/2024 in [facility] [place]  PHQ-2 Total Score 0       12/05/2024   11:14 AM 11/29/2024    9:22 AM 11/13/2024    9:50 AM 11/08/2024    9:20 AM 08/01/2024   10:36 AM  Fall Risk   Falls in the past year? 1 1 1  1   0  Number falls in past yr: 1 1 1  1   0  Injury with Fall? 0 0 0  0  0   Risk for fall due to :  Impaired balance/gait Impaired balance/gait  No Fall Risks  Follow up Falls evaluation completed Falls evaluation completed Falls evaluation completed;Falls prevention discussed  Falls evaluation completed     Manually entered by patient   Data saved with a previous flowsheet row definition  She has had 2 falls Fall 2025 due to unsteadiness. No significant injury sustained.   Notes increased dyspnea over the past month. Notes this when talking on the phone as well as when singing at church. Notes easy fatigability for months. Some unsteadiness. No chest pain with this, abd pain, dizziness, lightheadedness. No prolonged periods of immobility.  No fmhx or personal h/o blood clots.  No h/o asthma or smoking.   Cough started 5d ago - she feels it is improving. No fevers/chills, ear or tooth pain or headache.  Seen last week with L jaw pain thought TMJ dysfunction. This  is also improving.   She is in the process of weaning off klonopin , down to 1/2 tab at night, no refill needed at this time.   S/p lumbar posterior/lateral fusion L3-5 by Dr Lanis 03/2024.   S/p TAH, oophorectomy 1990s.   ESOPHAGOGASTRODUODENOSCOPY 01/2019 - gastritis, o/w normal ([facility])  Preventative: COLONOSCOPY 01/2019 - WNL, diverticulosis, rpt 10 yrs ([facility]) TOTAL ABDOMINAL HYSTERECTOMY Date: 1995 both ovaries removed. CA125 level elevated - but no cancer. Pap smear 2014 - continued despite hysterectomy. Always normal. We have stopped cervical cancer screening.  G1P1  Mammogram - Birads1 @ [facility] 01/2024.  DEXA 10/2019 T -1.2 hip (osteopenia).  DEXA 02/2024: T -1.5 RFN, not at increased fracture risk.  Lung cancer screening - not eligible  Flu shot declines  COVID vaccine - Pfizer 03/2020 x2, no booster Prevnar-13 01/2017, pneumovax-23 02/2018, prevnar-20 declined Tdap 06/2014 Shingrix - declines. Has had shingles x1.  Advanced directive: living will scanned 01/2015, brings updated declaration of desire for natural death - does not want prolonged life support if terminal condition. Has durable POA but not HCPOA. Would want son or DIL to be HCPOA.  Seat belt use discussed.  Sunscreen use discussed. No changing moles on skin. Sees derm  regularly.  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 Interim medical history since our last visit reviewed. Allergies and medications reviewed and updated. Outpatient  Medications Prior to Visit  Medication Sig Dispense Refill   acetaminophen  (TYLENOL ) 500 MG tablet Take 1 tablet (500 mg total) by mouth at bedtime. (Patient taking differently: Take 500 mg by mouth every 8 (eight) hours as needed.)     ALLERGY RELIEF CETIRIZINE  10 MG tablet TAKE 1 TABLET BY MOUTH EVERY DAY AT BEDTIME 30 tablet 10   Cholecalciferol  (VITAMIN D ) 50 MCG (2000 UT) tablet Take 2,000 Units by mouth every morning.     clonazePAM  (KLONOPIN ) 0.5 MG tablet TAKE 1 TABLET BY MOUTH EVERY DAY AT BEDTIME 30 tablet 3   cyanocobalamin  (V-R VITAMIN B-12) 500 MCG tablet Take 1 tablet (500 mcg total) by mouth daily.     fluticasone  (FLONASE ) 50 MCG/ACT nasal spray Place 2 sprays into both nostrils daily. (Patient taking differently: Place 2 sprays into both nostrils daily as needed for rhinitis or allergies.) 16 g 2   traMADol (ULTRAM) 50 MG tablet Take 50 mg by mouth 3 (three) times daily as needed.     amLODipine  (NORVASC ) 10 MG tablet TAKE 1 TABLET (10 MG TOTAL) BY MOUTH DAILY. 90 tablet 0   atorvastatin  (LIPITOR) 20 MG tablet TAKE 1 TABLET BY MOUTH DAILY 90 tablet 0   levothyroxine  (SYNTHROID ) 75 MCG tablet TAKE 1 TABLET BY MOUTH EVERY MORNING 90 tablet 0   pantoprazole  (PROTONIX ) 20 MG tablet Take 1 tablet (20 mg total) by mouth daily. 30 tablet 0   sertraline  (ZOLOFT ) 100 MG tablet TAKE 1 TABLET (100 MG TOTAL) BY MOUTH EVERY MORNING. 90 tablet 0   No facility-administered medications prior to visit.     Per HPI unless specifically indicated in ROS section below Review of Systems  Constitutional:  Negative for activity change, appetite change, chills, fatigue, fever and unexpected weight change.  HENT:  Negative for hearing loss.   Eyes:  Negative for visual disturbance.  Respiratory:  Positive for cough (recent cold), shortness of breath and wheezing. Negative for chest tightness.   Cardiovascular:  Positive for leg swelling (R > L occasoinal). Negative for chest pain and palpitations.   Gastrointestinal:  Negative for abdominal distention, abdominal pain, blood in stool, constipation, diarrhea, nausea and vomiting.  Genitourinary:  Negative for difficulty urinating and hematuria.  Musculoskeletal:  Negative for arthralgias, myalgias and neck pain.  Skin:  Negative for rash.  Neurological:  Positive for light-headedness (occ). Negative for dizziness, seizures, syncope and headaches.  Hematological:  Negative for adenopathy. Does not bruise/bleed easily.  Psychiatric/Behavioral:  Negative for dysphoric mood. The patient is nervous/anxious.     Objective:  BP 120/70 (Cuff Size: Normal)   Pulse 83   Temp 98.4 F (36.9 C) (Oral)   Ht 5' 1.75 (1.568 m)   Wt 155 lb 9.6 oz (70.6 kg)   SpO2 97%   BMI 28.69 kg/m   Wt Readings from Last 3 Encounters:  12/05/24  155 lb 9.6 oz (70.6 kg)  11/29/24 153 lb 6.4 oz (69.6 kg)  11/16/24 151 lb (68.5 kg)      Physical Exam Vitals and nursing note reviewed.  Constitutional:      Appearance: Normal appearance. She is not ill-appearing.  HENT:     Head: Normocephalic and atraumatic.     Right Ear: Tympanic membrane, ear canal and external ear normal. There is no impacted cerumen.     Left Ear: Tympanic membrane, ear canal and external ear normal. There is no impacted cerumen.     Mouth/Throat:     Mouth: Mucous membranes are moist.     Pharynx: Oropharynx is clear. No oropharyngeal exudate or posterior oropharyngeal erythema.  Eyes:     General:        Right eye: No discharge.        Left eye: No discharge.     Extraocular Movements: Extraocular movements intact.     Conjunctiva/sclera: Conjunctivae normal.     Pupils: Pupils are equal, round, and reactive to light.  Neck:     Thyroid : No thyroid  mass or thyromegaly.     Vascular: No carotid bruit.  Cardiovascular:     Rate and Rhythm: Normal rate and regular rhythm.     Pulses: Normal pulses.     Heart sounds: Normal heart sounds. No murmur heard. Pulmonary:      Effort: Pulmonary effort is normal. No respiratory distress.     Breath sounds: Wheezing (diffusely) and rhonchi (coarse throughout) present.     Comments:  Tight throughout Coarse breath sounds predominantly bilateral lower lobes Abdominal:     General: Bowel sounds are normal. There is no distension.     Palpations: Abdomen is soft. There is no mass.     Tenderness: There is no abdominal tenderness. There is no guarding or rebound.     Hernia: No hernia is present.  Musculoskeletal:     Cervical back: Normal range of motion and neck supple. No rigidity.     Right lower leg: No edema.     Left lower leg: No edema.  Lymphadenopathy:     Cervical: No cervical adenopathy.  Skin:    General: Skin is warm and dry.     Findings: No rash.  Neurological:     General: No focal deficit present.     Mental Status: She is alert. Mental status is at baseline.  Psychiatric:        Mood and Affect: Mood normal.        Behavior: Behavior normal.       Results for orders placed or performed in visit on 11/28/24  CBC with Differential/Platelet   Collection Time: 11/28/24  9:01 AM  Result Value Ref Range   WBC 6.0 4.0 - 10.5 K/uL   RBC 4.34 3.87 - 5.11 Mil/uL   Hemoglobin 12.9 12.0 - 15.0 g/dL   HCT 61.6 63.9 - 53.9 %   MCV 88.3 78.0 - 100.0 fl   MCHC 33.7 30.0 - 36.0 g/dL   RDW 86.1 88.4 - 84.4 %   Platelets 204.0 150.0 - 400.0 K/uL   Neutrophils Relative % 60.6 43.0 - 77.0 %   Lymphocytes Relative 23.8 12.0 - 46.0 %   Monocytes Relative 11.5 3.0 - 12.0 %   Eosinophils Relative 3.4 0.0 - 5.0 %   Basophils Relative 0.7 0.0 - 3.0 %   Neutro Abs 3.6 1.4 - 7.7 K/uL   Lymphs Abs 1.4 0.7 - 4.0 K/uL  Monocytes Absolute 0.7 0.1 - 1.0 K/uL   Eosinophils Absolute 0.2 0.0 - 0.7 K/uL   Basophils Absolute 0.0 0.0 - 0.1 K/uL  Hemoglobin A1c   Collection Time: 11/28/24  9:01 AM  Result Value Ref Range   Hgb A1c MFr Bld 6.1 4.6 - 6.5 %  VITAMIN D  25 Hydroxy (Vit-D Deficiency, Fractures)    Collection Time: 11/28/24  9:01 AM  Result Value Ref Range   VITD 35.82 30.00 - 100.00 ng/mL  Vitamin B12   Collection Time: 11/28/24  9:01 AM  Result Value Ref Range   Vitamin B-12 657 211 - 911 pg/mL  TSH   Collection Time: 11/28/24  9:01 AM  Result Value Ref Range   TSH 8.06 (H) 0.35 - 5.50 uIU/mL  Comprehensive metabolic panel with GFR   Collection Time: 11/28/24  9:01 AM  Result Value Ref Range   Sodium 144 135 - 145 mEq/L   Potassium 3.6 3.5 - 5.1 mEq/L   Chloride 106 96 - 112 mEq/L   CO2 31 19 - 32 mEq/L   Glucose, Bld 94 70 - 99 mg/dL   BUN 17 6 - 23 mg/dL   Creatinine, Ser 9.17 0.40 - 1.20 mg/dL   Total Bilirubin 0.4 0.2 - 1.2 mg/dL   Alkaline Phosphatase 102 39 - 117 U/L   AST 16 5 - 37 U/L   ALT 11 3 - 35 U/L   Total Protein 7.6 6.0 - 8.3 g/dL   Albumin 4.2 3.5 - 5.2 g/dL   GFR 30.39 >39.99 mL/min   Calcium  9.1 8.4 - 10.5 mg/dL  Lipid panel   Collection Time: 11/28/24  9:01 AM  Result Value Ref Range   Cholesterol 161 28 - 200 mg/dL   Triglycerides 860.9 89.9 - 149.0 mg/dL   HDL 53.09 >60.99 mg/dL   VLDL 72.1 0.0 - 59.9 mg/dL   LDL Cholesterol 86 10 - 99 mg/dL   Total CHOL/HDL Ratio 3    NonHDL 113.76     Assessment & Plan:   Problem List Items Addressed This Visit     Advanced care planning/counseling discussion - Primary (Chronic)   Advanced directive: living will scanned 01/2015, and 12/2023 updated declaration of desire for natural death - does not want prolonged life support if terminal condition. Has durable POA but not HCPOA. Would want son or DIL to be HCPOA.       MDD (major depressive disorder), recurrent episode, moderate (HCC)   Chronic, stable period on sertraline  - continue       Relevant Medications   sertraline  (ZOLOFT ) 100 MG tablet   Hypertension   Chronic, stable. Continue current regimen.       Relevant Medications   amLODipine  (NORVASC ) 10 MG tablet   atorvastatin  (LIPITOR) 20 MG tablet   Prediabetes   Continue limiting  added sugar in diet.       HLD (hyperlipidemia)   Chronic, stable period on atorvastatin  - continue. The 10-year ASCVD risk score (Arnett DK, et al., 2019) is: 20.4%   Values used to calculate the score:     Age: 68 years     Clinically relevant sex: Female     Is Non-Hispanic African American: No     Diabetic: No     Tobacco smoker: No     Systolic Blood Pressure: 120 mmHg     Is BP treated: Yes     HDL Cholesterol: 46.9 mg/dL     Total Cholesterol: 161 mg/dL  Relevant Medications   amLODipine  (NORVASC ) 10 MG tablet   atorvastatin  (LIPITOR) 20 MG tablet   Hypothyroidism   TSH was elevated - will increase levothyroxine  to 88mcg daily and reassess in 6 wks.      Relevant Medications   levothyroxine  (SYNTHROID ) 88 MCG tablet   GERD (gastroesophageal reflux disease)   Continues omeprazole  40mg  daily.       Relevant Medications   omeprazole  (PRILOSEC) 40 MG capsule   Osteopenia   Continue calcium  ,vit D and regular weight bearing exercise.       Low serum vitamin B12   Stable period on b12 replacement - continue this.       Vitamin D  deficiency   Stable period on vit D 2000 units daily       Acute cough   Present for the past 5 days, with abnormal lung sounds - suspect bronchitis with possible reactive airways No known h/o asthma, COPD, smoking history although she endorses 2nd hand smoke exposure.  Check CXR today, Rx prednisone , azithromycin , albuterol  inhaler.  Update if not improving to consider further imaging.       Relevant Orders   DG Chest 2 View   Shortness of breath   See above.  Coarse breath sounds throughout  Update CXR - clear on my review.       Relevant Orders   DG Chest 2 View   GAD (generalized anxiety disorder)   Continue sertraline  100mg  daily. She continues tapering klonopin  dose, down to 1/2 tab nightly.       Relevant Medications   sertraline  (ZOLOFT ) 100 MG tablet     Meds ordered this encounter  Medications    omeprazole  (PRILOSEC) 40 MG capsule    Sig: Take 1 capsule (40 mg total) by mouth daily.    Dispense:  90 capsule    Refill:  3   amLODipine  (NORVASC ) 10 MG tablet    Sig: Take 1 tablet (10 mg total) by mouth daily.    Dispense:  90 tablet    Refill:  3   atorvastatin  (LIPITOR) 20 MG tablet    Sig: Take 1 tablet (20 mg total) by mouth daily.    Dispense:  90 tablet    Refill:  3   levothyroxine  (SYNTHROID ) 88 MCG tablet    Sig: Take 1 tablet (88 mcg total) by mouth every morning.    Dispense:  90 tablet    Refill:  3   sertraline  (ZOLOFT ) 100 MG tablet    Sig: Take 1 tablet (100 mg total) by mouth every morning.    Dispense:  90 tablet    Refill:  3   predniSONE  (DELTASONE ) 20 MG tablet    Sig: Take two tablets daily for 3 days followed by one tablet daily for 4 days    Dispense:  10 tablet    Refill:  0   albuterol  (VENTOLIN  HFA) 108 (90 Base) MCG/ACT inhaler    Sig: Inhale 1-2 puffs into the lungs every 6 (six) hours as needed for wheezing or shortness of breath.    Dispense:  8 g    Refill:  0   azithromycin  (ZITHROMAX ) 250 MG tablet    Sig: Take 2 tablets on day 1, then 1 tablet daily on days 2 through 5    Dispense:  6 tablet    Refill:  0    Orders Placed This Encounter  Procedures   DG Chest 2 View    Standing Status:   Future  Number of Occurrences:   1    Expiration Date:   12/05/2025    Reason for Exam (SYMPTOM  OR DIAGNOSIS REQUIRED):   dyspnea x 1 month acutely worse x1 week with productive cough    Preferred imaging location?:   [facility]    Patient Instructions  Medicines refilled to [facility] I have increased levothyroxine  to 88mcg daily - higher dose. Schedule lab visit 6 weeks after starting higher dose to recheck thyroid  function.  For cough and shortness of breath - chest xray today, start prednisone  taper and zpack antibiotics sent to pharmacy. You may use albuterol  rescue inhaler as needed for wheezing or shortness of  breath 1-2 puffs every 6 hours as needed - this can make you jittery/increase heart rate.  Let me know if not improving with treatment.  Return in 4 months for follow up visit   Follow up plan: Return in about 4 months (around 04/04/2025) for follow up visit.  Anton Blas, MD   "

## 2024-12-05 NOTE — Assessment & Plan Note (Addendum)
 Advanced directive: living will scanned 01/2015, and 12/2023 updated declaration of desire for natural death - does not want prolonged life support if terminal condition. Has durable POA but not HCPOA. Would want son or DIL to be HCPOA.

## 2024-12-05 NOTE — Assessment & Plan Note (Signed)
 TSH was elevated - will increase levothyroxine  to 88mcg daily and reassess in 6 wks.

## 2024-12-05 NOTE — Assessment & Plan Note (Addendum)
 See above.  Coarse breath sounds throughout  Update CXR - clear on my review.

## 2024-12-05 NOTE — Assessment & Plan Note (Signed)
 Continue limiting added sugar in diet.

## 2024-12-05 NOTE — Assessment & Plan Note (Signed)
 Continue sertraline  100mg  daily. She continues tapering klonopin  dose, down to 1/2 tab nightly.

## 2024-12-08 ENCOUNTER — Telehealth: Payer: Self-pay

## 2024-12-08 NOTE — Telephone Encounter (Signed)
 Pleas notify cxr has not been read by radiology yet. However on my review I don't see any obvious pneumonia.  How is she feeling with antibiotics, prednisone  and inhaler?

## 2024-12-08 NOTE — Telephone Encounter (Signed)
 Copied from CRM #[id number]. Topic: Clinical - Lab/Test Results >> Dec 08, 2024  2:15 PM Deleta RAMAN wrote: Reason for CRM: patient needs update regarding chest xray please contact

## 2024-12-11 ENCOUNTER — Ambulatory Visit: Payer: Self-pay

## 2024-12-11 NOTE — Telephone Encounter (Addendum)
 Spoke with patient.  Yesterday felt much better, now today starting to feel worse again with worsening dyspnea.  She completed prednisone , zpack, she continues albuterol  inhaler.  Not having productive cough or fever or sore throat.  + increased mucous production.  She notes cough is improving.   Recommend continue current treatment - call us  in 2 days with another update.  If she doesn't call us  by Wed plz call to follow-up with patient.

## 2024-12-11 NOTE — Telephone Encounter (Signed)
 FYI Only or Action Required?: Action required by provider: request for appointment, Xray result follow-up needed, and antibiotic request. Unsure if she will go to urgent care.   Patient was last seen in primary care on 12/05/2024 by Rilla Baller, MD.  Called Nurse Triage reporting Results and Shortness of Breath.  Symptoms began 11/30/24.  Interventions attempted: OTC medications: Mucinex, Prescription medications: prednisone , azithromycin , albuterol , and Rest, hydration, or home remedies.  Symptoms are: gradually worsening.  Triage Disposition: See HCP Within 4 Hours (Or PCP Triage)  Patient/caregiver understands and will follow disposition?: No, refused disposition, wants appointment with pcp  Reason for Disposition  [1] MILD difficulty breathing (e.g., minimal/no SOB at rest, SOB with walking, pulse < 100) AND [2] NEW-onset or WORSE than normal  Answer Assessment - Initial Assessment Questions Patient calling in for her chest xray results from 12/05/24, upset she has not been provided with results. She completed prednisone  today, azithromycin  2 days ago, continues on albuterol  inhaler. She reports she had some improvement on treatment but today shortness of breath is worsening. She is reporting conversational dyspnea, inhaler with some relief.   No appointments are available in clinic today, advised urgent care for evaluation of worsening shortness of breath, she is unsure if she will go. She would like for PCP to review xray, prescribe more antibiotics, pcp appointment this week. Please return call.    1. RESPIRATORY STATUS: Describe your breathing? (e.g., wheezing, shortness of breath, unable to speak, severe coughing)      Shortness of breath  2. ONSET: When did this breathing problem begin?      One week  3. PATTERN Does the difficult breathing come and go, or has it been constant since it started?      Constant  4. SEVERITY: How bad is your breathing? (e.g., mild,  moderate, severe)      Moderate when talking  5. RECURRENT SYMPTOM: Have you had difficulty breathing before? If Yes, ask: When was the last time? and What happened that time?      Yes for one week  6. CARDIAC HISTORY: Do you have any history of heart disease? (e.g., heart attack, angina, bypass surgery, angioplasty)       7. LUNG HISTORY: Do you have any history of lung disease?  (e.g., pulmonary embolus, asthma, emphysema)     no 8. CAUSE: What do you think is causing the breathing problem?      Illness  9. OTHER SYMPTOMS: Do you have any other symptoms? (e.g., chest pain, cough, dizziness, fever, runny nose)     Clear nasal drainage.  Protocols used: Breathing Difficulty-A-AH   Copied from CRM [id number]. Topic: Clinical - Red Word Triage >> Dec 11, 2024 12:34 PM Suzen RAMAN wrote: Red Word that prompted transfer to Nurse Triage: SOB; patient initial inquiry was about the results of her chest X-RAY and per provider   Pleas notify cxr has not been read by radiology yet. However on my review I don't see any obvious pneumonia.  How is she feeling with antibiotics, prednisone  and inhaler?  Patient also mention needing refills but doesn't know the name of the antibiotic she was placed on

## 2024-12-13 ENCOUNTER — Ambulatory Visit: Payer: Self-pay | Admitting: Family Medicine

## 2024-12-13 NOTE — Telephone Encounter (Signed)
 Called patient she states that she has completed abx and prednisone . She is still having sob with talking. She doesn't feel like it is any other time. She states that if she does not continue to have improvement she will call our office.

## 2025-01-24 ENCOUNTER — Other Ambulatory Visit

## 2025-04-04 ENCOUNTER — Ambulatory Visit: Admitting: Family Medicine
